# Patient Record
Sex: Male | Born: 1950 | Race: White | Hispanic: No | Marital: Married | Smoking: Never smoker
Health system: Southern US, Community
[De-identification: ages and names within clinical notes are randomized; demographics above are authoritative.]

## PROBLEM LIST (undated history)

## (undated) DIAGNOSIS — I2510 Atherosclerotic heart disease of native coronary artery without angina pectoris: Secondary | ICD-10-CM

## (undated) DIAGNOSIS — E669 Obesity, unspecified: Secondary | ICD-10-CM

## (undated) DIAGNOSIS — K219 Gastro-esophageal reflux disease without esophagitis: Secondary | ICD-10-CM

## (undated) DIAGNOSIS — E785 Hyperlipidemia, unspecified: Secondary | ICD-10-CM

## (undated) DIAGNOSIS — E039 Hypothyroidism, unspecified: Secondary | ICD-10-CM

## (undated) DIAGNOSIS — M1990 Unspecified osteoarthritis, unspecified site: Secondary | ICD-10-CM

## (undated) DIAGNOSIS — C189 Malignant neoplasm of colon, unspecified: Secondary | ICD-10-CM

## (undated) HISTORY — DX: Atherosclerotic heart disease of native coronary artery without angina pectoris: I25.10

## (undated) HISTORY — DX: Hyperlipidemia, unspecified: E78.5

## (undated) HISTORY — DX: Obesity, unspecified: E66.9

## (undated) HISTORY — PX: TONSILLECTOMY: SUR1361

## (undated) HISTORY — DX: Gastro-esophageal reflux disease without esophagitis: K21.9

## (undated) HISTORY — PX: FACIAL RECONSTRUCTION SURGERY: SHX631

## (undated) HISTORY — DX: Hypothyroidism, unspecified: E03.9

## (undated) HISTORY — PX: FINGER SURGERY: SHX640

---

## 1993-06-30 DIAGNOSIS — K219 Gastro-esophageal reflux disease without esophagitis: Secondary | ICD-10-CM

## 1993-06-30 HISTORY — PX: ESOPHAGOGASTRODUODENOSCOPY: SHX1529

## 1993-06-30 HISTORY — DX: Gastro-esophageal reflux disease without esophagitis: K21.9

## 1999-11-17 ENCOUNTER — Emergency Department (HOSPITAL_COMMUNITY): Admission: EM | Admit: 1999-11-17 | Discharge: 1999-11-17 | Payer: Self-pay | Admitting: Emergency Medicine

## 1999-11-19 ENCOUNTER — Emergency Department (HOSPITAL_COMMUNITY): Admission: EM | Admit: 1999-11-19 | Discharge: 1999-11-19 | Payer: Self-pay | Admitting: Emergency Medicine

## 1999-11-25 ENCOUNTER — Emergency Department (HOSPITAL_COMMUNITY): Admission: EM | Admit: 1999-11-25 | Discharge: 1999-11-25 | Payer: Self-pay | Admitting: *Deleted

## 2003-03-01 HISTORY — PX: COLONOSCOPY: SHX174

## 2003-03-10 ENCOUNTER — Ambulatory Visit (HOSPITAL_COMMUNITY): Admission: RE | Admit: 2003-03-10 | Discharge: 2003-03-10 | Payer: Self-pay | Admitting: Gastroenterology

## 2003-03-10 LAB — HM COLONOSCOPY

## 2013-01-22 ENCOUNTER — Encounter (HOSPITAL_COMMUNITY): Payer: Self-pay | Admitting: *Deleted

## 2013-01-22 ENCOUNTER — Emergency Department (HOSPITAL_COMMUNITY)
Admission: EM | Admit: 2013-01-22 | Discharge: 2013-01-22 | Disposition: A | Discharge status: Home / Self Care | Payer: BC Managed Care – PPO | Attending: Emergency Medicine | Admitting: Emergency Medicine

## 2013-01-22 DIAGNOSIS — S91009A Unspecified open wound, unspecified ankle, initial encounter: Secondary | ICD-10-CM | POA: Insufficient documentation

## 2013-01-22 DIAGNOSIS — W5911XA Bitten by nonvenomous snake, initial encounter: Secondary | ICD-10-CM | POA: Insufficient documentation

## 2013-01-22 DIAGNOSIS — W5901XA Bitten by nonvenomous lizards, initial encounter: Secondary | ICD-10-CM | POA: Insufficient documentation

## 2013-01-22 DIAGNOSIS — Z79899 Other long term (current) drug therapy: Secondary | ICD-10-CM | POA: Insufficient documentation

## 2013-01-22 DIAGNOSIS — E079 Disorder of thyroid, unspecified: Secondary | ICD-10-CM | POA: Insufficient documentation

## 2013-01-22 DIAGNOSIS — S81009A Unspecified open wound, unspecified knee, initial encounter: Secondary | ICD-10-CM | POA: Insufficient documentation

## 2013-01-22 DIAGNOSIS — Y9289 Other specified places as the place of occurrence of the external cause: Secondary | ICD-10-CM | POA: Insufficient documentation

## 2013-01-22 DIAGNOSIS — Y9389 Activity, other specified: Secondary | ICD-10-CM | POA: Insufficient documentation

## 2013-01-22 DIAGNOSIS — E7800 Pure hypercholesterolemia, unspecified: Secondary | ICD-10-CM | POA: Insufficient documentation

## 2013-01-22 DIAGNOSIS — Z7982 Long term (current) use of aspirin: Secondary | ICD-10-CM | POA: Insufficient documentation

## 2013-01-22 LAB — CBC WITH DIFFERENTIAL/PLATELET
Basophils Absolute: 0 10*3/uL (ref 0.0–0.1)
Eosinophils Relative: 2 % (ref 0–5)
Lymphocytes Relative: 27 % (ref 12–46)
MCV: 93.3 fL (ref 78.0–100.0)
Neutro Abs: 3.9 10*3/uL (ref 1.7–7.7)
Neutrophils Relative %: 62 % (ref 43–77)
Platelets: 246 10*3/uL (ref 150–400)
RDW: 14 % (ref 11.5–15.5)
WBC: 6.3 10*3/uL (ref 4.0–10.5)

## 2013-01-22 LAB — COMPREHENSIVE METABOLIC PANEL
ALT: 32 U/L (ref 0–53)
AST: 21 U/L (ref 0–37)
Alkaline Phosphatase: 56 U/L (ref 39–117)
CO2: 24 mEq/L (ref 19–32)
Calcium: 9.4 mg/dL (ref 8.4–10.5)
GFR calc non Af Amer: 57 mL/min — ABNORMAL LOW (ref >90)
Potassium: 4.3 mEq/L (ref 3.5–5.1)
Sodium: 139 mEq/L (ref 135–145)

## 2013-01-22 LAB — PROTIME-INR: INR: 0.98 (ref 0.00–1.49)

## 2013-01-22 MED ORDER — SODIUM CHLORIDE 0.9 % IV BOLUS (SEPSIS)
1000.0000 mL | Freq: Once | INTRAVENOUS | Status: AC
  Administered 2013-01-22: 1000 mL via INTRAVENOUS

## 2013-01-22 NOTE — ED Notes (Signed)
PT reports Rt leg is numb from toes to above the Rt knee. Pt has full range of motion of Rt leg. No swelling noted to RT leg.

## 2013-01-22 NOTE — ED Notes (Signed)
Pt reports possible snake bite to right lower leg, one small puncture wound noted, no swelling or redness noted. Pt was standing in water, did not see the snake.

## 2013-01-22 NOTE — ED Provider Notes (Signed)
  CSN: [id number]     Arrival date & time 01/22/13  1102 History     First MD Initiated Contact with Patient 01/22/13 1118     Chief Complaint  Patient presents with  . Snake Bite   (Consider location/radiation/quality/duration/timing/severity/associated sxs/prior Treatment) HPI.... patient was standing in a creek when he was bit by a snake in his right lower leg. There is a small puncture wound.  Slight redness surrounding the wound. Pain radiates up the leg a small distance. No chest pain, shortness of breath, fever, chills, rash.  Severity is mild. Nothing makes symptoms better or worse.  Past Medical History  Diagnosis Date  . Thyroid disease   . High cholesterol    History reviewed. No pertinent past surgical history. History reviewed. No pertinent family history. History  Substance Use Topics  . Smoking status: Not on file  . Smokeless tobacco: Not on file  . Alcohol Use: Yes    Review of Systems  All other systems reviewed and are negative.    Allergies  Review of patient's allergies indicates no known allergies.  Home Medications   Current Outpatient Rx  Name  Route  Sig  Dispense  Refill  . aspirin 81 MG chewable tablet   Oral   Chew 81 mg by mouth daily.         . Atorvastatin Calcium (LIPITOR PO)   Oral   Take 1 tablet by mouth daily.         Marland Kitchen esomeprazole (NEXIUM) 40 MG capsule   Oral   Take 40 mg by mouth daily before breakfast.         . Levothyroxine Sodium (SYNTHROID PO)   Oral   Take 1 tablet by mouth daily with breakfast.          BP 122/75  Pulse 75  Temp(Src) 97.3 F (36.3 C) (Oral)  Resp 18  SpO2 97% Physical Exam  Nursing note and vitals reviewed. Constitutional: He is oriented to person, place, and time. He appears well-developed and well-nourished.  HENT:  Head: Normocephalic and atraumatic.  Eyes: Conjunctivae and EOM are normal. Pupils are equal, round, and reactive to light.  Neck: Normal range of motion. Neck  supple.  Cardiovascular: Normal rate, regular rhythm and normal heart sounds.   Pulmonary/Chest: Effort normal and breath sounds normal.  Abdominal: Soft. Bowel sounds are normal.  Musculoskeletal: Normal range of motion.  Neurological: He is alert and oriented to person, place, and time.  Skin:  Right lower extremity: Very small puncture wound in the medial aspect of the right mid tibia.  Slight erythema.  Psychiatric: He has a normal mood and affect.    ED Course   Procedures (including critical care time)  Labs Reviewed  COMPREHENSIVE METABOLIC PANEL - Abnormal; Notable for the following:    GFR calc non Af Amer 57 (*)    GFR calc Af Amer 66 (*)    All other components within normal limits  CBC WITH DIFFERENTIAL  APTT  PROTIME-INR   No results found. 1. Snake bite, initial encounter     MDM  Patient observed for greater than 2 hours. No systemic side effects noted. Normal vital signs. Wound has not changed in character. No Crofab necessary  Donnetta Hutching, MD 01/22/13 1422

## 2013-03-25 LAB — COMPLETE METABOLIC PANEL WITH GFR
ALBUMIN: 3.9
ALK PHOS: 46 U/L
ALT: 24
AST: 19 U/L
BILIRUBIN: 0.5
CREATININE: 1.1
GLUCOSE: 100
POTASSIUM: 5.1 mmol/L
Sodium: 140 mmol/L (ref 137–147)

## 2013-03-25 LAB — LIPID PANEL
Cholesterol, Total: 204
HDL: 59 mg/dL (ref 35–70)
LDL (calc): 133
Triglycerides: 58

## 2013-03-25 LAB — TSH
FREE T4: 1.1
TSH: 1.58

## 2013-03-25 LAB — PSA: PSA: 1.857

## 2013-03-25 LAB — CBC
HEMOGLOBIN: 15.3 g/dL
PLATELET COUNT: 251
WBC: 5.8

## 2014-07-14 ENCOUNTER — Encounter (INDEPENDENT_AMBULATORY_CARE_PROVIDER_SITE_OTHER): Payer: Self-pay

## 2014-07-14 ENCOUNTER — Encounter: Payer: Self-pay | Admitting: Family Medicine

## 2014-07-14 ENCOUNTER — Ambulatory Visit (INDEPENDENT_AMBULATORY_CARE_PROVIDER_SITE_OTHER): Payer: BLUE CROSS/BLUE SHIELD | Admitting: Family Medicine

## 2014-07-14 VITALS — BP 126/76 | HR 60 | Temp 97.8°F | Ht 66.75 in | Wt 196.8 lb

## 2014-07-14 DIAGNOSIS — J3489 Other specified disorders of nose and nasal sinuses: Secondary | ICD-10-CM

## 2014-07-14 DIAGNOSIS — Z23 Encounter for immunization: Secondary | ICD-10-CM

## 2014-07-14 DIAGNOSIS — E785 Hyperlipidemia, unspecified: Secondary | ICD-10-CM

## 2014-07-14 DIAGNOSIS — E039 Hypothyroidism, unspecified: Secondary | ICD-10-CM | POA: Insufficient documentation

## 2014-07-14 DIAGNOSIS — K219 Gastro-esophageal reflux disease without esophagitis: Secondary | ICD-10-CM

## 2014-07-14 DIAGNOSIS — E669 Obesity, unspecified: Secondary | ICD-10-CM

## 2014-07-14 HISTORY — DX: Obesity, unspecified: E66.9

## 2014-07-14 LAB — BASIC METABOLIC PANEL
BUN: 17 mg/dL (ref 6–23)
CALCIUM: 9.6 mg/dL (ref 8.4–10.5)
CHLORIDE: 108 meq/L (ref 96–112)
CO2: 25 meq/L (ref 19–32)
Creatinine, Ser: 1.28 mg/dL (ref 0.40–1.50)
GFR: 60.39 mL/min (ref >60.00)
GLUCOSE: 104 mg/dL — AB (ref 70–99)
Potassium: 4.5 mEq/L (ref 3.5–5.1)
Sodium: 142 mEq/L (ref 135–145)

## 2014-07-14 LAB — LIPID PANEL
CHOLESTEROL: 193 mg/dL (ref 0–200)
HDL: 56.8 mg/dL (ref >39.00)
LDL CALC: 116 mg/dL — AB (ref 0–99)
NONHDL: 136.2
Total CHOL/HDL Ratio: 3
Triglycerides: 99 mg/dL (ref 0.0–149.0)
VLDL: 19.8 mg/dL (ref 0.0–40.0)

## 2014-07-14 LAB — TSH: TSH: 1.54 u[IU]/mL (ref 0.35–4.50)

## 2014-07-14 MED ORDER — ESOMEPRAZOLE MAGNESIUM 40 MG PO CPDR: 40.0000 mg | DELAYED_RELEASE_CAPSULE | Freq: Every day | ORAL | Status: DC

## 2014-07-14 MED ORDER — LEVOTHYROXINE SODIUM 100 MCG PO TABS: 100.0000 ug | ORAL_TABLET | Freq: Every day | ORAL | Status: DC

## 2014-07-14 MED ORDER — ATORVASTATIN CALCIUM 20 MG PO TABS: 20.0000 mg | ORAL_TABLET | Freq: Every day | ORAL | Status: DC

## 2014-07-14 NOTE — Assessment & Plan Note (Signed)
Stable on nexium - continue.

## 2014-07-14 NOTE — Assessment & Plan Note (Signed)
Chronic, stable. Recheck TSH.

## 2014-07-14 NOTE — Assessment & Plan Note (Signed)
Concern for [facility] - derm referral today.

## 2014-07-14 NOTE — Progress Notes (Signed)
BP 126/76 mmHg  Pulse 60  Temp(Src) 97.8 F (36.6 C) (Oral)  Ht 5' 6.75" (1.695 m)  Wt 196 lb 12 oz (89.245 kg)  BMI 31.06 kg/m2  SpO2 97%   CC: new pt to establish  Subjective:    Patient ID: Caleb Smith, male    DOB: 1950/10/24, 64 y.o.   MRN: [id number]  HPI: Caleb Smith is a 64 y.o. male presenting on 07/14/2014 for Establish Care   Prior saw Dr [facility].  Hyperlipidemia - tolerating atorvastatin 20mg  daily without myalgias.   GERD - nexium 40mg  daily without breakthrough symptoms. h/o esophageal dilation for stricture. Improved once quit smokeless tobacco.   Hypothyroidism - levothyroxine 179mcg daily. Longterm. Acquired. No hypothyroid sxs of weight changes, BM changes, hairi/nail changes.  Wakes up coughing in am, minimal white phlegm. Ongoing for last year. No weight changes. Mother with lung cancer. No cough rest of day.   Bump on nose on left present for 1-2 yrs. Recurrent bleeding then healing. Interested in further evaluation.  DOT CPE every 2 years.  Regular CPE every year.  Preventative: Colonoscopy - unsure, about 5 yrs ago. May be due for rpt. Flu shot today.  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 Interim medical history since our last visit reviewed. Allergies and medications reviewed and updated. Current Outpatient Prescriptions on File Prior to Visit  Medication Sig  . aspirin 81 MG chewable tablet Chew 81 mg by mouth daily.   No current facility-administered medications on file prior to visit.    Review of Systems Per HPI unless specifically indicated above     Objective:    BP 126/76 mmHg  Pulse 60  Temp(Src) 97.8 F (36.6 C) (Oral)  Ht 5' 6.75" (1.695 m)  Wt 196 lb 12 oz (89.245 kg)  BMI 31.06 kg/m2   SpO2 97%  Wt Readings from Last 3 Encounters:  07/14/14 196 lb 12 oz (89.245 kg)    Physical Exam  Constitutional: He is oriented to person, place, and time. He appears well-developed and well-nourished. No distress.  HENT:  Head: Normocephalic and atraumatic.  Right Ear: Hearing, tympanic membrane, external ear and ear canal normal.  Left Ear: Hearing, tympanic membrane, external ear and ear canal normal.  Nose: Nose normal.  Mouth/Throat: Oropharynx is clear and moist and mucous membranes are normal. No oropharyngeal exudate, posterior oropharyngeal edema or posterior oropharyngeal erythema.    Nodule with recurrent ulceration left nose  Eyes: Conjunctivae and EOM are normal. Pupils are equal, round, and reactive to light. No scleral icterus.  Neck: Normal range of motion. Neck supple. No thyromegaly present.  Cardiovascular: Normal rate, regular rhythm, normal heart sounds and intact distal pulses.   No murmur heard. Pulses:      Radial pulses are 2+ on the right side, and 2+ on the left side.  Pulmonary/Chest: Effort normal and breath sounds normal. No respiratory distress. He has no wheezes. He has no rales.  Musculoskeletal: Normal range of motion. He exhibits no edema.  Lymphadenopathy:    He has no cervical adenopathy.  Neurological: He is alert and oriented to person, place, and time.  CN grossly intact, station and gait intact  Skin: Skin is warm and dry. No rash noted.  Psychiatric: He has a normal mood and affect. His behavior is normal. Judgment and  thought content normal.  Nursing note and vitals reviewed.      Assessment & Plan:   Problem List Items Addressed This Visit    Obesity   Lesion of nose - Primary    Concern for [facility] - derm referral today.      Relevant Orders   Ambulatory referral to Dermatology   Hypothyroidism    Chronic, stable. Recheck TSH.      Relevant Medications   levothyroxine (SYNTHROID, LEVOTHROID) tablet   Other Relevant Orders    TSH   HLD (hyperlipidemia)    Chronic, tolerating atorvastatin. Check FLP today as fasting.      Relevant Medications   atorvastatin (LIPITOR) tablet   Other Relevant Orders   Lipid panel   Basic metabolic panel   GERD (gastroesophageal reflux disease)    Stable on nexium - continue.      Relevant Medications   esomeprazole (NEXIUM) capsule    Other Visit Diagnoses    Need for influenza vaccination        Relevant Orders    Flu Vaccine QUAD 36+ mos PF IM (Fluarix Quad PF) (Completed)        Follow up plan: Return in about 3 months (around 10/13/2014), or as needed, for annual exam, prior fasting for blood work.

## 2014-07-14 NOTE — Progress Notes (Signed)
Pre visit review using our clinic review tool, if applicable. No additional management support is needed unless otherwise documented below in the visit note. 

## 2014-07-14 NOTE — Patient Instructions (Addendum)
Flu shot today. Pass by [facility]'s office for referral to skin doctor. Return at your convenience in 3-4 months for fasting labs and afterwards for physical. Good to meet you today, call us with questions.

## 2014-07-14 NOTE — Assessment & Plan Note (Addendum)
Chronic, tolerating atorvastatin. Check FLP today as fasting.

## 2014-07-17 ENCOUNTER — Encounter: Payer: Self-pay | Admitting: *Deleted

## 2014-07-17 LAB — CBC
HGB: 15.3 g/dL
WBC: 5.8
platelet count: 251

## 2014-07-17 LAB — LIPID PANEL
CHOLESTEROL, TOTAL: 204
HDL: 59 mg/dL (ref 35–70)
LDL CALC: 133
TRIGLYCERIDES: 58

## 2014-07-17 LAB — COMPREHENSIVE METABOLIC PANEL
BUN: 16 mg/dL (ref 4–21)
Creat: 1.1
GFR CALC AF AMER: 82.3
GLUCOSE: 100
Potassium: 5.1 mmol/L
SODIUM: 140 mmol/L (ref 137–147)

## 2014-07-17 LAB — PSA: PSA: 1.857

## 2014-07-17 LAB — TSH: TSH: 1.58

## 2014-08-02 ENCOUNTER — Encounter: Payer: Self-pay | Admitting: *Deleted

## 2014-08-08 ENCOUNTER — Other Ambulatory Visit: Payer: Self-pay | Admitting: Dermatology

## 2014-09-10 ENCOUNTER — Encounter: Payer: Self-pay | Admitting: Family Medicine

## 2014-09-13 ENCOUNTER — Encounter: Payer: Self-pay | Admitting: *Deleted

## 2014-10-11 ENCOUNTER — Other Ambulatory Visit: Payer: Self-pay | Admitting: Family Medicine

## 2014-10-13 ENCOUNTER — Other Ambulatory Visit: Payer: BLUE CROSS/BLUE SHIELD

## 2014-10-20 ENCOUNTER — Encounter: Payer: BLUE CROSS/BLUE SHIELD | Admitting: Family Medicine

## 2014-10-27 ENCOUNTER — Encounter: Payer: Self-pay | Admitting: Family Medicine

## 2014-10-27 ENCOUNTER — Ambulatory Visit (INDEPENDENT_AMBULATORY_CARE_PROVIDER_SITE_OTHER): Payer: BLUE CROSS/BLUE SHIELD | Admitting: Family Medicine

## 2014-10-27 VITALS — BP 118/78 | HR 76 | Temp 97.9°F | Ht 66.75 in | Wt 198.0 lb

## 2014-10-27 DIAGNOSIS — E669 Obesity, unspecified: Secondary | ICD-10-CM

## 2014-10-27 DIAGNOSIS — Z0000 Encounter for general adult medical examination without abnormal findings: Secondary | ICD-10-CM | POA: Diagnosis not present

## 2014-10-27 DIAGNOSIS — E785 Hyperlipidemia, unspecified: Secondary | ICD-10-CM

## 2014-10-27 DIAGNOSIS — E039 Hypothyroidism, unspecified: Secondary | ICD-10-CM

## 2014-10-27 DIAGNOSIS — K219 Gastro-esophageal reflux disease without esophagitis: Secondary | ICD-10-CM

## 2014-10-27 MED ORDER — OMEPRAZOLE 40 MG PO CPDR: 40.0000 mg | DELAYED_RELEASE_CAPSULE | Freq: Every day | ORAL | Status: DC

## 2014-10-27 NOTE — Assessment & Plan Note (Signed)
nexium unaffordable, will now prescribe omeprazole 40mg  daily. Pt will update me with effect.

## 2014-10-27 NOTE — Assessment & Plan Note (Addendum)
Preventative protocols reviewed and updated unless pt declined. Discussed healthy diet and lifestyle.  

## 2014-10-27 NOTE — Progress Notes (Signed)
Pre visit review using our clinic review tool, if applicable. No additional management support is needed unless otherwise documented below in the visit note. 

## 2014-10-27 NOTE — Assessment & Plan Note (Signed)
Body mass index is 31.26 kg/(m^2).

## 2014-10-27 NOTE — Progress Notes (Signed)
BP 118/78 mmHg  Pulse 76  Temp(Src) 97.9 F (36.6 C) (Oral)  Ht 5' 6.75" (1.695 m)  Wt 198 lb (89.812 kg)  BMI 31.26 kg/m2   CC: CPE  Subjective:    Patient ID: Caleb Smith, male    DOB: 1950-08-22, 64 y.o.   MRN: [id number]  HPI: [name] is a 64 y.o. male presenting on 10/27/2014 for Annual Exam   GERD - nexium over expensive.  HLD - compliant with lipitor 13m daily. No myalgias. Hypothyroidism - compliant with levothyroxine 1067m daily.   Preventative: Colonoscopy - at [facility]. About 5 yrs ago.  Prostate cancer screening - discussed, would like to defer. Flu shot 2016 Td 2011 Zostavax 2013 Seat belt use discussed Sunscreen use discussed. No changing moles on skin.  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 Interim medical history since our last visit reviewed. Allergies and medications reviewed and updated. Current Outpatient Prescriptions on File Prior to Visit  Medication Sig  . aspirin 81 MG chewable tablet Chew 81 mg by mouth daily.  . Marland Kitchentorvastatin (LIPITOR) 20 MG tablet Take 1 tablet (20 mg total) by mouth daily at 6 PM.  . levothyroxine (SYNTHROID, LEVOTHROID) 100 MCG tablet Take 1 tablet (100 mcg total) by mouth daily before breakfast.   No current facility-administered medications on file prior to visit.    Review of Systems  Constitutional: Negative for fever, chills, activity change, appetite change, fatigue and unexpected weight change.  HENT: Negative for hearing loss.   Eyes: Negative for visual disturbance.  Respiratory: Positive for cough (am). Negative for chest tightness, shortness of breath and wheezing.   Cardiovascular: Negative for chest pain, palpitations and leg swelling.  Gastrointestinal: Negative for nausea, vomiting,  abdominal pain, diarrhea, constipation, blood in stool and abdominal distention.  Genitourinary: Negative for hematuria and difficulty urinating.  Musculoskeletal: Negative for myalgias, arthralgias and neck pain.  Skin: Negative for rash.  Neurological: Negative for dizziness, seizures, syncope and headaches.  Hematological: Negative for adenopathy. Does not bruise/bleed easily.  Psychiatric/Behavioral: Negative for dysphoric mood. The patient is not nervous/anxious.    Per HPI unless specifically indicated above     Objective:    BP 118/78 mmHg  Pulse 76  Temp(Src) 97.9 F (36.6 C) (Oral)  Ht 5' 6.75" (1.695 m)  Wt 198 lb (89.812 kg)  BMI 31.26 kg/m2  Wt Readings from Last 3 Encounters:  10/27/14 198 lb (89.812 kg)  07/14/14 196 lb 12 oz (89.245 kg)    Physical Exam  Constitutional: He is oriented to person, place, and time. He appears well-developed and well-nourished. No distress.  HENT:  Head: Normocephalic and atraumatic.  Right Ear: Hearing, tympanic membrane, external ear and ear canal normal.  Left Ear: Hearing, tympanic membrane, external ear and ear canal normal.  Nose: Nose normal.  Mouth/Throat: Uvula is midline, oropharynx is clear and moist and mucous membranes are normal. No oropharyngeal exudate, posterior oropharyngeal edema or posterior oropharyngeal erythema.  Eyes: Conjunctivae and EOM are normal. Pupils are equal, round, and reactive to light. No scleral icterus.  Neck: Normal range of motion. Neck supple. Carotid bruit is not present. No thyromegaly present.  Cardiovascular: Normal rate, regular rhythm, normal heart sounds and intact distal pulses.   No murmur heard. Pulses:      Radial pulses are 2+ on the right  side, and 2+ on the left side.  Pulmonary/Chest: Effort normal and breath sounds normal. No respiratory distress. He has no wheezes. He has no rales.  Abdominal: Soft. Bowel sounds are normal. He exhibits no distension and no mass. There is no  tenderness. There is no rebound and no guarding.  Musculoskeletal: Normal range of motion. He exhibits no edema.  Lymphadenopathy:    He has no cervical adenopathy.  Neurological: He is alert and oriented to person, place, and time.  CN grossly intact, station and gait intact  Skin: Skin is warm and dry. No rash noted.  Psychiatric: He has a normal mood and affect. His behavior is normal. Judgment and thought content normal.  Nursing note and vitals reviewed.  Results for orders placed or performed in visit on 09/13/14  Comprehensive metabolic panel  Result Value Ref Range   Glucose 100    BUN 16 4 - 21 mg/dL   Creat 1.1    EGFR (African American) 82.3    Sodium 140 137 - 147 mmol/L   Potassium 5.1 mmol/L  CBC  Result Value Ref Range   WBC 5.80    HGB 15.3 g/dL   platelet count 251   Lipid panel  Result Value Ref Range   Cholesterol, Total 204    Triglycerides 58    HDL 59 35 - 70 mg/dL   LDL (calc) 133   TSH  Result Value Ref Range   TSH 1.58   PSA  Result Value Ref Range   PSA 1.857       Assessment & Plan:   Problem List Items Addressed This Visit    Obesity    Body mass index is 31.26 kg/(m^2).       Hypothyroidism    Chronic, stable. Continue levothyroxine.      HLD (hyperlipidemia)    Chronic, stable. Continue lipitor.      Health maintenance examination - Primary    Preventative protocols reviewed and updated unless pt declined. Discussed healthy diet and lifestyle.       GERD (gastroesophageal reflux disease)    nexium unaffordable, will now prescribe omeprazole 13m daily. Pt will update me with effect.      Relevant Medications   omeprazole (PRILOSEC) 40 MG capsule       Follow up plan: Return in about 1 year (around 10/27/2015), or as needed, for follow up visit.

## 2014-10-27 NOTE — Patient Instructions (Addendum)
Check with wife on name of stomach doctor for colonoscopy. Sign release form for colonoscopy at front office. Return in 4 months for lab visit only Good to see you today, call us with questions. Return in 1 year for next physical.

## 2014-10-27 NOTE — Assessment & Plan Note (Signed)
Chronic, stable. Continue levothyroxine. 

## 2014-10-27 NOTE — Assessment & Plan Note (Signed)
Chronic, stable. Continue lipitor.  

## 2014-11-11 ENCOUNTER — Telehealth: Payer: Self-pay | Admitting: Family Medicine

## 2014-11-11 ENCOUNTER — Encounter: Payer: Self-pay | Admitting: Family Medicine

## 2014-11-11 DIAGNOSIS — Z1211 Encounter for screening for malignant neoplasm of colon: Secondary | ICD-10-CM

## 2014-11-11 NOTE — Telephone Encounter (Signed)
plz notify I received copy of colonoscopy - actually had done 2004 so is due for f/u. Would offer either rpt colonoscopy or come in for iFOB. Let us know what he decides.

## 2014-11-14 NOTE — Telephone Encounter (Signed)
Patient notified as instructed by telephone and verbalized understanding. Patient stated that he would rather come in for the iFOB.

## 2014-11-14 NOTE — Telephone Encounter (Signed)
Ordered. plz schedule lab visit.

## 2014-11-15 NOTE — Telephone Encounter (Signed)
Left voicemail for patient to call back. 

## 2014-11-15 NOTE — Telephone Encounter (Signed)
Patient does not need lab appt. He only needs to pick up stool kit at his convenience.

## 2014-11-17 ENCOUNTER — Telehealth: Payer: Self-pay | Admitting: Family Medicine

## 2014-11-17 NOTE — Telephone Encounter (Signed)
Lm on pts vm requesting a call back 

## 2014-11-17 NOTE — Telephone Encounter (Signed)
Cards mailed to patient with instructions.

## 2014-11-17 NOTE — Telephone Encounter (Signed)
Please call pt back, he is concerned about needing labs, thanks

## 2014-11-17 NOTE — Telephone Encounter (Signed)
Spoke with patient. He does not need lab work. He only needed stool kit, which I mailed to him today. He was confused about message that was left for him about needing appt to pick one up.

## 2014-12-04 ENCOUNTER — Encounter: Payer: Self-pay | Admitting: *Deleted

## 2014-12-04 ENCOUNTER — Other Ambulatory Visit: Payer: BLUE CROSS/BLUE SHIELD

## 2014-12-04 DIAGNOSIS — Z1211 Encounter for screening for malignant neoplasm of colon: Secondary | ICD-10-CM

## 2014-12-04 LAB — FECAL OCCULT BLOOD, IMMUNOCHEMICAL: Fecal Occult Bld: NEGATIVE

## 2014-12-04 LAB — FECAL OCCULT BLOOD, GUAIAC: FECAL OCCULT BLD: NEGATIVE

## 2014-12-06 NOTE — Telephone Encounter (Signed)
error 

## 2014-12-26 ENCOUNTER — Ambulatory Visit (INDEPENDENT_AMBULATORY_CARE_PROVIDER_SITE_OTHER): Payer: BLUE CROSS/BLUE SHIELD | Admitting: Internal Medicine

## 2014-12-26 ENCOUNTER — Encounter: Payer: Self-pay | Admitting: Internal Medicine

## 2014-12-26 VITALS — BP 130/82 | HR 62 | Temp 98.0°F | Wt 197.0 lb

## 2014-12-26 DIAGNOSIS — S30863A Insect bite (nonvenomous) of scrotum and testes, initial encounter: Secondary | ICD-10-CM | POA: Diagnosis not present

## 2014-12-26 DIAGNOSIS — W57XXXA Bitten or stung by nonvenomous insect and other nonvenomous arthropods, initial encounter: Secondary | ICD-10-CM

## 2014-12-26 NOTE — Progress Notes (Signed)
Subjective:    Patient ID: Caleb Smith, male    DOB: 17-Jul-1950, 64 y.o.   MRN: [id number]  HPI  Pt presents to the clinic today with c/o a tick bite. This occurred yesterday. It was located on his left testicle. He reports it is very itchy. It was very red yesterday but the redness has gone down. He does not think the tick was on there for > 24 hours. The tick was not engorged. He has noticed some clear drainage from the tick bite. He denies fever, chills, body aches, diarrhea, joint pain of confusion. He has not put anything OTC on it.  Review of Systems      Past Medical History  Diagnosis Date  . Hypothyroidism   . HLD (hyperlipidemia)   . GERD (gastroesophageal reflux disease) 1995    s/p esoph dilation for stricture  . Obesity 07/14/2014    Current Outpatient Prescriptions  Medication Sig Dispense Refill  . aspirin 81 MG chewable tablet Chew 81 mg by mouth daily.    Marland Kitchen atorvastatin (LIPITOR) 20 MG tablet Take 1 tablet (20 mg total) by mouth daily at 6 PM. 30 tablet 3  . levothyroxine (SYNTHROID, LEVOTHROID) 100 MCG tablet Take 1 tablet (100 mcg total) by mouth daily before breakfast. 30 tablet 3  . omeprazole (PRILOSEC) 40 MG capsule Take 1 capsule (40 mg total) by mouth daily. 30 capsule 11   No current facility-administered medications for this visit.    No Known Allergies  Family History  Problem Relation Age of Onset  . Cancer Mother     breast  . CAD Maternal Uncle 62    massive MI  . Stroke Neg Hx   . Diabetes Neg Hx   . Hypertension Neg Hx   . Suicidality Father     suicide    History   Social History  . Marital Status: Married    Spouse Name: N/A  . Number of Children: N/A  . Years of Education: N/A   Occupational History  . Not on file.   Social History Main Topics  . Smoking status: Never Smoker   . Smokeless tobacco: Former Systems developer    Types: Chew  . Alcohol Use: 0.0 oz/week    0 Standard drinks or equivalent per week     Comment:  2-3 beer daily  . Drug Use: No  . Sexual Activity: Not on file   Other Topics Concern  . Not on file   Social History Narrative   Lives with wife   Grown children   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[employer]   Edu: [employer]   Activity: active on farm   Diet: good water, fruits/vegetables daily     Constitutional: Denies fever, malaise, fatigue, headache or abrupt weight changes.  Respiratory: Denies difficulty breathing, shortness of breath, cough or sputum production.   Cardiovascular: Denies chest pain, chest tightness, palpitations or swelling in the hands or feet.  Skin: Pt reports tick bite. Denies  ulcercations.    No other specific complaints in a complete review of systems (except as listed in HPI above).  Objective:   Physical Exam  BP 130/82 mmHg  Pulse 62  Temp(Src) 98 F (36.7 C) (Oral)  Wt 197 lb (89.359 kg)  SpO2 99% Wt Readings from Last 3 Encounters:  12/26/14 197 lb (89.359 kg)  10/27/14 198 lb (89.812 kg)  07/14/14 196 lb 12 oz (89.245 kg)    General: Appears his stated age, in NAD. Skin: Warm,  dry and intact. Tick bite noted to proximal left testicle. Local reaction only. No s/s of infection. Cardiovascular: Normal rate and rhythm. S1,S2 noted.   Pulmonary/Chest: Normal effort and positive vesicular breath sounds. No respiratory distress. No wheezes, rales or ronchi noted.  Abdomen: Soft and nontender. Normal bowel sounds, no bruits noted. No distention or masses noted. Liver, spleen and kidneys non palpable.   BMET    Component Value Date/Time   NA 140 07/17/2014   NA 142 07/14/2014 1102   K 5.1 07/17/2014   K 4.5 07/14/2014 1102   CL 108 07/14/2014 1102   CO2 25 07/14/2014 1102   GLUCOSE 104* 07/14/2014 1102   BUN 16 07/17/2014   BUN 17 07/14/2014 1102   CREATININE 1.1 07/17/2014   CREATININE 1.28 07/14/2014 1102   CALCIUM 9.6 07/14/2014 1102   GFRNONAA 57* 01/22/2013 1226   GFRAA 82.3 07/17/2014   GFRAA 66* 01/22/2013 1226     Lipid Panel     Component Value Date/Time   CHOL 204 07/17/2014   CHOL 193 07/14/2014 1102   TRIG 58 07/17/2014   TRIG 99.0 07/14/2014 1102   HDL 59 07/17/2014   CHOLHDL 3 07/14/2014 1102   VLDL 19.8 07/14/2014 1102   LDLCALC 133 07/17/2014   LDLCALC 116* 07/14/2014 1102    CBC    Component Value Date/Time   WBC 5.80 07/17/2014   RBC 4.61 01/22/2013 1226   HGB 15.3 07/17/2014   HGB 15.0 01/22/2013 1226   HCT 43.0 01/22/2013 1226   PLT 246 01/22/2013 1226   MCV 93.3 01/22/2013 1226   MCH 32.5 01/22/2013 1226   MCHC 34.9 01/22/2013 1226   RDW 14.0 01/22/2013 1226   LYMPHSABS 1.7 01/22/2013 1226   MONOABS 0.6 01/22/2013 1226   EOSABS 0.1 01/22/2013 1226   BASOSABS 0.0 01/22/2013 1226    Hgb A1C No results found for: HGBA1C       Assessment & Plan:   Tick bite of scrotum with local reaction:  Use Hydrocortisone cream to the affected area BID Watch for redness, fever, rash, joint pains, etc  RTC as needed or if symptoms persist or worsen

## 2014-12-26 NOTE — Patient Instructions (Signed)
Tick Bite Information Ticks are insects that attach themselves to the skin and draw blood for food. There are various types of ticks. Common types include wood ticks and deer ticks. Most ticks live in shrubs and grassy areas. Ticks can climb onto your body when you make contact with leaves or grass where the tick is waiting. The most common places on the body for ticks to attach themselves are the scalp, neck, armpits, waist, and groin. Most tick bites are harmless, but sometimes ticks carry germs that cause diseases. These germs can be spread to a person during the tick's feeding process. The chance of a disease spreading through a tick bite depends on:   The type of tick.  Time of year.   How long the tick is attached.   Geographic location.  HOW CAN YOU PREVENT TICK BITES? Take these steps to help prevent tick bites when you are outdoors:  Wear protective clothing. Long sleeves and long pants are best.   Wear white clothes so you can see ticks more easily.  Tuck your pant legs into your socks.   If walking on a trail, stay in the middle of the trail to avoid brushing against bushes.  Avoid walking through areas with long grass.  Put insect repellent on all exposed skin and along boot tops, pant legs, and sleeve cuffs.   Check clothing, hair, and skin repeatedly and before going inside.   Brush off any ticks that are not attached.  Take a shower or bath as soon as possible after being outdoors.  WHAT IS THE PROPER WAY TO REMOVE A TICK? Ticks should be removed as soon as possible to help prevent diseases caused by tick bites. 1. If latex gloves are available, put them on before trying to remove a tick.  2. Using fine-point tweezers, grasp the tick as close to the skin as possible. You may also use curved forceps or a tick removal tool. Grasp the tick as close to its head as possible. Avoid grasping the tick on its body. 3. Pull gently with steady upward pressure until  the tick lets go. Do not twist the tick or jerk it suddenly. This may break off the tick's head or mouth parts. 4. Do not squeeze or crush the tick's body. This could force disease-carrying fluids from the tick into your body.  5. After the tick is removed, wash the bite area and your hands with soap and water or other disinfectant such as alcohol. 6. Apply a small amount of antiseptic cream or ointment to the bite site.  7. Wash and disinfect any instruments that were used.  Do not try to remove a tick by applying a hot match, petroleum jelly, or fingernail polish to the tick. These methods do not work and may increase the chances of disease being spread from the tick bite.  WHEN SHOULD YOU SEEK MEDICAL CARE? Contact your health care provider if you are unable to remove a tick from your skin or if a part of the tick breaks off and is stuck in the skin.  After a tick bite, you need to be aware of signs and symptoms that could be related to diseases spread by ticks. Contact your health care provider if you develop any of the following in the days or weeks after the tick bite:  Unexplained fever.  Rash. A circular rash that appears days or weeks after the tick bite may indicate the possibility of Lyme disease. The rash may resemble   a target with a bull's-eye and may occur at a different part of your body than the tick bite.  Redness and swelling in the area of the tick bite.   Tender, swollen lymph glands.   Diarrhea.   Weight loss.   Cough.   Fatigue.   Muscle, joint, or bone pain.   Abdominal pain.   Headache.   Lethargy or a change in your level of consciousness.  Difficulty walking or moving your legs.   Numbness in the legs.   Paralysis.  Shortness of breath.   Confusion.   Repeated vomiting.  Document Released: 06/13/2000 Document Revised: 04/06/2013 Document Reviewed: 11/24/2012 Exit[employer] Patient Information 2015 [employer]. This information is  not intended to replace advice given to you by your health care provider. Make sure you discuss any questions you have with your health care provider.  

## 2015-02-24 ENCOUNTER — Other Ambulatory Visit: Payer: Self-pay | Admitting: Family Medicine

## 2015-02-24 DIAGNOSIS — E669 Obesity, unspecified: Secondary | ICD-10-CM

## 2015-02-24 DIAGNOSIS — E785 Hyperlipidemia, unspecified: Secondary | ICD-10-CM

## 2015-02-24 DIAGNOSIS — E039 Hypothyroidism, unspecified: Secondary | ICD-10-CM

## 2015-02-28 ENCOUNTER — Other Ambulatory Visit (INDEPENDENT_AMBULATORY_CARE_PROVIDER_SITE_OTHER): Payer: BLUE CROSS/BLUE SHIELD

## 2015-02-28 DIAGNOSIS — E785 Hyperlipidemia, unspecified: Secondary | ICD-10-CM

## 2015-02-28 DIAGNOSIS — E039 Hypothyroidism, unspecified: Secondary | ICD-10-CM

## 2015-02-28 DIAGNOSIS — E669 Obesity, unspecified: Secondary | ICD-10-CM

## 2015-02-28 LAB — COMPREHENSIVE METABOLIC PANEL
ALT: 25 U/L (ref 0–53)
AST: 19 U/L (ref 0–37)
Albumin: 4.1 g/dL (ref 3.5–5.2)
Alkaline Phosphatase: 50 U/L (ref 39–117)
BUN: 14 mg/dL (ref 6–23)
CO2: 25 meq/L (ref 19–32)
Calcium: 8.9 mg/dL (ref 8.4–10.5)
Chloride: 108 mEq/L (ref 96–112)
Creatinine, Ser: 1.05 mg/dL (ref 0.40–1.50)
GFR: 75.75 mL/min (ref >60.00)
GLUCOSE: 96 mg/dL (ref 70–99)
POTASSIUM: 4.1 meq/L (ref 3.5–5.1)
Sodium: 140 mEq/L (ref 135–145)
Total Bilirubin: 0.5 mg/dL (ref 0.2–1.2)
Total Protein: 6.7 g/dL (ref 6.0–8.3)

## 2015-02-28 LAB — LIPID PANEL
CHOL/HDL RATIO: 3
Cholesterol: 169 mg/dL (ref 0–200)
HDL: 58.2 mg/dL (ref >39.00)
LDL CALC: 97 mg/dL (ref 0–99)
NONHDL: 110.94
Triglycerides: 71 mg/dL (ref 0.0–149.0)
VLDL: 14.2 mg/dL (ref 0.0–40.0)

## 2015-02-28 LAB — TSH: TSH: 2.23 u[IU]/mL (ref 0.35–4.50)

## 2015-03-02 ENCOUNTER — Encounter: Payer: Self-pay | Admitting: *Deleted

## 2015-05-02 ENCOUNTER — Other Ambulatory Visit: Payer: Self-pay | Admitting: Family Medicine

## 2015-10-21 ENCOUNTER — Other Ambulatory Visit: Payer: Self-pay | Admitting: Family Medicine

## 2015-10-21 DIAGNOSIS — E039 Hypothyroidism, unspecified: Secondary | ICD-10-CM

## 2015-10-21 DIAGNOSIS — E785 Hyperlipidemia, unspecified: Secondary | ICD-10-CM

## 2015-10-21 DIAGNOSIS — Z125 Encounter for screening for malignant neoplasm of prostate: Secondary | ICD-10-CM

## 2015-10-21 DIAGNOSIS — Z1159 Encounter for screening for other viral diseases: Secondary | ICD-10-CM

## 2015-10-23 ENCOUNTER — Other Ambulatory Visit: Payer: Self-pay | Admitting: Family Medicine

## 2015-10-23 ENCOUNTER — Other Ambulatory Visit (INDEPENDENT_AMBULATORY_CARE_PROVIDER_SITE_OTHER): Payer: BLUE CROSS/BLUE SHIELD

## 2015-10-23 DIAGNOSIS — Z125 Encounter for screening for malignant neoplasm of prostate: Secondary | ICD-10-CM | POA: Diagnosis not present

## 2015-10-23 DIAGNOSIS — Z1159 Encounter for screening for other viral diseases: Secondary | ICD-10-CM

## 2015-10-23 DIAGNOSIS — E785 Hyperlipidemia, unspecified: Secondary | ICD-10-CM

## 2015-10-23 DIAGNOSIS — E039 Hypothyroidism, unspecified: Secondary | ICD-10-CM

## 2015-10-23 LAB — TSH: TSH: 4.35 u[IU]/mL (ref 0.35–4.50)

## 2015-10-23 LAB — BASIC METABOLIC PANEL
BUN: 14 mg/dL (ref 6–23)
CO2: 27 mEq/L (ref 19–32)
Calcium: 9.3 mg/dL (ref 8.4–10.5)
Chloride: 105 mEq/L (ref 96–112)
Creatinine, Ser: 1.32 mg/dL (ref 0.40–1.50)
GFR: 58.05 mL/min — AB (ref >60.00)
Glucose, Bld: 107 mg/dL — ABNORMAL HIGH (ref 70–99)
POTASSIUM: 4.3 meq/L (ref 3.5–5.1)
SODIUM: 140 meq/L (ref 135–145)

## 2015-10-23 LAB — LIPID PANEL
CHOLESTEROL: 200 mg/dL (ref 0–200)
HDL: 59.1 mg/dL (ref >39.00)
LDL CALC: 118 mg/dL — AB (ref 0–99)
NonHDL: 140.6
TRIGLYCERIDES: 114 mg/dL (ref 0.0–149.0)
Total CHOL/HDL Ratio: 3
VLDL: 22.8 mg/dL (ref 0.0–40.0)

## 2015-10-23 LAB — PSA: PSA: 1.97 ng/mL (ref 0.10–4.00)

## 2015-10-24 LAB — HEPATITIS C ANTIBODY: HCV AB: NEGATIVE

## 2015-10-30 ENCOUNTER — Ambulatory Visit (INDEPENDENT_AMBULATORY_CARE_PROVIDER_SITE_OTHER): Payer: BLUE CROSS/BLUE SHIELD | Admitting: Family Medicine

## 2015-10-30 ENCOUNTER — Encounter: Payer: Self-pay | Admitting: Family Medicine

## 2015-10-30 VITALS — BP 134/82 | HR 64 | Temp 98.0°F | Ht 66.75 in | Wt 198.2 lb

## 2015-10-30 DIAGNOSIS — Z1211 Encounter for screening for malignant neoplasm of colon: Secondary | ICD-10-CM

## 2015-10-30 DIAGNOSIS — E669 Obesity, unspecified: Secondary | ICD-10-CM

## 2015-10-30 DIAGNOSIS — Z0000 Encounter for general adult medical examination without abnormal findings: Secondary | ICD-10-CM | POA: Diagnosis not present

## 2015-10-30 DIAGNOSIS — M722 Plantar fascial fibromatosis: Secondary | ICD-10-CM | POA: Insufficient documentation

## 2015-10-30 DIAGNOSIS — K219 Gastro-esophageal reflux disease without esophagitis: Secondary | ICD-10-CM

## 2015-10-30 DIAGNOSIS — E785 Hyperlipidemia, unspecified: Secondary | ICD-10-CM

## 2015-10-30 DIAGNOSIS — E039 Hypothyroidism, unspecified: Secondary | ICD-10-CM

## 2015-10-30 MED ORDER — SYNTHROID 100 MCG PO TABS: ORAL_TABLET | ORAL | Status: DC

## 2015-10-30 MED ORDER — OMEPRAZOLE 40 MG PO CPDR: 40.0000 mg | DELAYED_RELEASE_CAPSULE | Freq: Every day | ORAL | Status: DC

## 2015-10-30 MED ORDER — ATORVASTATIN CALCIUM 20 MG PO TABS: ORAL_TABLET | ORAL | Status: DC

## 2015-10-30 NOTE — Assessment & Plan Note (Signed)
Preventative protocols reviewed and updated unless pt declined. Discussed healthy diet and lifestyle.  

## 2015-10-30 NOTE — Assessment & Plan Note (Addendum)
Chronic, stable. Continue lipitor daily. Discussed diet changes to improve readings

## 2015-10-30 NOTE — Assessment & Plan Note (Signed)
Discussed healthy diet and lifestyle changes to affect sustainable weight loss  

## 2015-10-30 NOTE — Assessment & Plan Note (Signed)
Chronic, stable. Continue current regimen. 

## 2015-10-30 NOTE — Addendum Note (Signed)
Addended by: Ria Bush on: 10/30/2015 09:08 AM   Modules accepted: Miquel Dunn

## 2015-10-30 NOTE — Assessment & Plan Note (Signed)
Discussed treatment options, stretching exercises provided

## 2015-10-30 NOTE — Patient Instructions (Addendum)
Try spacing out omeprazole to every other day.  Pass by lab to pick up stool kit. For plantar fasciitis - do frozen water bottle massage, do exercises provided today, try heel lift gel insert into shoe. Should improve over time.  Good to see you today, call us with quesitons.  Health Maintenance, Male A healthy lifestyle and preventative care can promote health and wellness.  Maintain regular health, dental, and eye exams.  Eat a healthy diet. Foods like vegetables, fruits, whole grains, low-fat dairy products, and lean protein foods contain the nutrients you need and are low in calories. Decrease your intake of foods high in solid fats, added sugars, and salt. Get information about a proper diet from your health care provider, if necessary.  Regular physical exercise is one of the most important things you can do for your health. Most adults should get at least 150 minutes of moderate-intensity exercise (any activity that increases your heart rate and causes you to sweat) each week. In addition, most adults need muscle-strengthening exercises on 2 or more days a week.   Maintain a healthy weight. The body mass index (BMI) is a screening tool to identify possible weight problems. It provides an estimate of body fat based on height and weight. Your health care provider can find your BMI and can help you achieve or maintain a healthy weight. For males 20 years and older:  A BMI below 18.5 is considered underweight.  A BMI of 18.5 to 24.9 is normal.  A BMI of 25 to 29.9 is considered overweight.  A BMI of 30 and above is considered obese.  Maintain normal blood lipids and cholesterol by exercising and minimizing your intake of saturated fat. Eat a balanced diet with plenty of fruits and vegetables. Blood tests for lipids and cholesterol should begin at age 89 and be repeated every 5 years. If your lipid or cholesterol levels are high, you are over age 34, or you are at high risk for heart  disease, you may need your cholesterol levels checked more frequently.Ongoing high lipid and cholesterol levels should be treated with medicines if diet and exercise are not working.  If you smoke, find out from your health care provider how to quit. If you do not use tobacco, do not start.  Lung cancer screening is recommended for adults aged 65-80 years who are at high risk for developing lung cancer because of a history of smoking. A yearly low-dose CT scan of the lungs is recommended for people who have at least a 30-pack-year history of smoking and are current smokers or have quit within the past 15 years. A pack year of smoking is smoking an average of 1 pack of cigarettes a day for 1 year (for example, a 30-pack-year history of smoking could mean smoking 1 pack a day for 30 years or 2 packs a day for 15 years). Yearly screening should continue until the smoker has stopped smoking for at least 15 years. Yearly screening should be stopped for people who develop a health problem that would prevent them from having lung cancer treatment.  If you choose to drink alcohol, do not have more than 2 drinks per day. One drink is considered to be 12 oz (360 mL) of beer, 5 oz (150 mL) of wine, or 1.5 oz (45 mL) of liquor.  Avoid the use of street drugs. Do not share needles with anyone. Ask for help if you need support or instructions about stopping the use of drugs.  High blood pressure causes heart disease and increases the risk of stroke. High blood pressure is more likely to develop in:  People who have blood pressure in the end of the normal range (100-139/85-89 mm Hg).  People who are overweight or obese.  People who are African American.  If you are 47-96 years of age, have your blood pressure checked every 3-5 years. If you are 78 years of age or older, have your blood pressure checked every year. You should have your blood pressure measured twice--once when you are at a hospital or clinic, and  once when you are not at a hospital or clinic. Record the average of the two measurements. To check your blood pressure when you are not at a hospital or clinic, you can use:  An automated blood pressure machine at a pharmacy.  A home blood pressure monitor.  If you are 29-29 years old, ask your health care provider if you should take aspirin to prevent heart disease.  Diabetes screening involves taking a blood sample to check your fasting blood sugar level. This should be done once every 3 years after age 28 if you are at a normal weight and without risk factors for diabetes. Testing should be considered at a younger age or be carried out more frequently if you are overweight and have at least 1 risk factor for diabetes.  Colorectal cancer can be detected and often prevented. Most routine colorectal cancer screening begins at the age of 90 and continues through age 80. However, your health care provider may recommend screening at an earlier age if you have risk factors for colon cancer. On a yearly basis, your health care provider may provide home test kits to check for hidden blood in the stool. A small camera at the end of a tube may be used to directly examine the colon (sigmoidoscopy or colonoscopy) to detect the earliest forms of colorectal cancer. Talk to your health care provider about this at age 63 when routine screening begins. A direct exam of the colon should be repeated every 5-10 years through age 45, unless early forms of precancerous polyps or small growths are found.  People who are at an increased risk for hepatitis B should be screened for this virus. You are considered at high risk for hepatitis B if:  You were born in a country where hepatitis B occurs often. Talk with your health care provider about which countries are considered high risk.  Your parents were born in a high-risk country and you have not received a shot to protect against hepatitis B (hepatitis B  vaccine).  You have HIV or AIDS.  You use needles to inject street drugs.  You live with, or have sex with, someone who has hepatitis B.  You are a man who has sex with other men (MSM).  You get hemodialysis treatment.  You take certain medicines for conditions like cancer, organ transplantation, and autoimmune conditions.  Hepatitis C blood testing is recommended for all people born from [birth year] through 1964 and any individual with known risk factors for hepatitis C.  Healthy men should no longer receive prostate-specific antigen (PSA) blood tests as part of routine cancer screening. Talk to your health care provider about prostate cancer screening.  Testicular cancer screening is not recommended for adolescents or adult males who have no symptoms. Screening includes self-exam, a health care provider exam, and other screening tests. Consult with your health care provider about any symptoms you have or any  concerns you have about testicular cancer.  Practice safe sex. Use condoms and avoid high-risk sexual practices to reduce the spread of sexually transmitted infections (STIs).  You should be screened for STIs, including gonorrhea and chlamydia if:  You are sexually active and are younger than 24 years.  You are older than 24 years, and your health care provider tells you that you are at risk for this type of infection.  Your sexual activity has changed since you were last screened, and you are at an increased risk for chlamydia or gonorrhea. Ask your health care provider if you are at risk.  If you are at risk of being infected with HIV, it is recommended that you take a prescription medicine daily to prevent HIV infection. This is called pre-exposure prophylaxis (PrEP). You are considered at risk if:  You are a man who has sex with other men (MSM).  You are a heterosexual man who is sexually active with multiple partners.  You take drugs by injection.  You are sexually active  with a partner who has HIV.  Talk with your health care provider about whether you are at high risk of being infected with HIV. If you choose to begin PrEP, you should first be tested for HIV. You should then be tested every 3 months for as long as you are taking PrEP.  Use sunscreen. Apply sunscreen liberally and repeatedly throughout the day. You should seek shade when your shadow is shorter than you. Protect yourself by wearing long sleeves, pants, a wide-brimmed hat, and sunglasses year round whenever you are outdoors.  Tell your health care provider of new moles or changes in moles, especially if there is a change in shape or color. Also, tell your health care provider if a mole is larger than the size of a pencil eraser.  A one-time screening for abdominal aortic aneurysm (AAA) and surgical repair of large AAAs by ultrasound is recommended for men aged 43-75 years who are current or former smokers.  Stay current with your vaccines (immunizations).   This information is not intended to replace advice given to you by your health care provider. Make sure you discuss any questions you have with your health care provider.   Document Released: 12/13/2007 Document Revised: 07/07/2014 Document Reviewed: 11/11/2010 Elsevier Interactive Patient Education [employer].

## 2015-10-30 NOTE — Assessment & Plan Note (Signed)
Discussed QOD PPI trial.

## 2015-10-30 NOTE — Progress Notes (Signed)
Pre visit review using our clinic review tool, if applicable. No additional management support is needed unless otherwise documented below in the visit note. 

## 2015-10-30 NOTE — Progress Notes (Signed)
BP 134/82 mmHg  Pulse 64  Temp(Src) 98 F (36.7 C) (Oral)  Ht 5' 6.75" (1.695 m)  Wt 198 lb 4 oz (89.926 kg)  BMI 31.30 kg/m2   CC: CPE  Subjective:    Patient ID: Caleb Smith, male    DOB: [birth year], 65 y.o.   MRN: [id number]  HPI: Caleb Smith is a 65 y.o. male presenting on 10/30/2015 for Annual Exam   GERD - nexium unaffordable, now on omeprazole 40mg  daily and doing well.  Preventative: COLONOSCOPY Date: 03/2003 small sigmoid polyp, rpt 5 yrs [name]) Prostate cancer screening - discussed, would like to defer DRE.  Flu shot yearly Td 2011 Pneumovax 2013 Zostavax 2013 Seat belt use discussed Sunscreen use discussed. No changing moles on skin.  Lives with wife Grown children Occupation: Gen foreman/lineman  Edu: HS Activity: active on farm  Diet: good water, fruits/vegetables daily   Relevant past medical, surgical, family and social history reviewed and updated as indicated. Interim medical history since our last visit reviewed. Allergies and medications reviewed and updated. Current Outpatient Prescriptions on File Prior to Visit  Medication Sig  . aspirin 81 MG chewable tablet Chew 81 mg by mouth daily.   No current facility-administered medications on file prior to visit.    Review of Systems  Constitutional: Negative for fever, chills, activity change, appetite change, fatigue and unexpected weight change.  HENT: Positive for sinus pressure. Negative for hearing loss.   Eyes: Negative for visual disturbance.  Respiratory: Positive for cough (am cough). Negative for chest tightness, shortness of breath and wheezing.   Cardiovascular: Negative for chest pain, palpitations and leg swelling.  Gastrointestinal: Negative for nausea, vomiting, abdominal pain, diarrhea, constipation, blood in stool and abdominal distention.  Genitourinary: Negative for hematuria and difficulty urinating.  Musculoskeletal: Negative for myalgias, arthralgias and  neck pain.  Skin: Negative for rash.  Neurological: Positive for headaches (occasional ?sinus). Negative for dizziness, seizures and syncope.  Hematological: Negative for adenopathy. Bruises/bleeds easily.  Psychiatric/Behavioral: Negative for dysphoric mood. The patient is not nervous/anxious.    Per HPI unless specifically indicated in ROS section     Objective:    BP 134/82 mmHg  Pulse 64  Temp(Src) 98 F (36.7 C) (Oral)  Ht 5' 6.75" (1.695 m)  Wt 198 lb 4 oz (89.926 kg)  BMI 31.30 kg/m2  Wt Readings from Last 3 Encounters:  10/30/15 198 lb 4 oz (89.926 kg)  12/26/14 197 lb (89.359 kg)  10/27/14 198 lb (89.812 kg)    Physical Exam  Constitutional: He is oriented to person, place, and time. He appears well-developed and well-nourished. No distress.  HENT:  Head: Normocephalic and atraumatic.  Right Ear: Hearing, tympanic membrane, external ear and ear canal normal.  Left Ear: Hearing, tympanic membrane, external ear and ear canal normal.  Nose: Nose normal.  Mouth/Throat: Uvula is midline, oropharynx is clear and moist and mucous membranes are normal. No oropharyngeal exudate, posterior oropharyngeal edema or posterior oropharyngeal erythema.  Eyes: Conjunctivae and EOM are normal. Pupils are equal, round, and reactive to light. No scleral icterus.  Neck: Normal range of motion. Neck supple. Carotid bruit is present. No thyromegaly present.  Cardiovascular: Normal rate, regular rhythm, normal heart sounds and intact distal pulses.   No murmur heard. Pulses:      Radial pulses are 2+ on the right side, and 2+ on the left side.  Pulmonary/Chest: Effort normal and breath sounds normal. No respiratory distress. He has no wheezes. He has  no rales.  Abdominal: Soft. Bowel sounds are normal. He exhibits no distension and no mass. There is no tenderness. There is no rebound and no guarding.  Genitourinary:  DRE - deferred  Musculoskeletal: Normal range of motion. He exhibits no  edema.  2+ DP on right Mildly tender to palpation at heel  Lymphadenopathy:    He has no cervical adenopathy.  Neurological: He is alert and oriented to person, place, and time.  CN grossly intact, station and gait intact  Skin: Skin is warm and dry. No rash noted.  Psychiatric: He has a normal mood and affect. His behavior is normal. Judgment and thought content normal.  Nursing note and vitals reviewed.  Results for orders placed or performed in visit on 10/23/15  Lipid panel  Result Value Ref Range   Cholesterol 200 0 - 200 mg/dL   Triglycerides 114.0 0.0 - 149.0 mg/dL   HDL 59.10 >39.00 mg/dL   VLDL 22.8 0.0 - 40.0 mg/dL   LDL Cholesterol 118 (H) 0 - 99 mg/dL   Total CHOL/HDL Ratio 3    NonHDL 140.60   TSH  Result Value Ref Range   TSH 4.35 0.35 - 4.50 uIU/mL  PSA  Result Value Ref Range   PSA 1.97 0.10 - 4.00 ng/mL  Basic metabolic panel  Result Value Ref Range   Sodium 140 135 - 145 mEq/L   Potassium 4.3 3.5 - 5.1 mEq/L   Chloride 105 96 - 112 mEq/L   CO2 27 19 - 32 mEq/L   Glucose, Bld 107 (H) 70 - 99 mg/dL   BUN 14 6 - 23 mg/dL   Creatinine, Ser 1.32 0.40 - 1.50 mg/dL   Calcium 9.3 8.4 - 10.5 mg/dL   GFR 58.05 (L) >60.00 mL/min      Assessment & Plan:   Problem List Items Addressed This Visit    Hypothyroidism    Chronic, stable. Continue current regimen.      Relevant Medications   SYNTHROID 100 MCG tablet   HLD (hyperlipidemia)    Chronic, stable. Continue lipitor daily. Discussed diet changes to improve readings      Relevant Medications   atorvastatin (LIPITOR) 20 MG tablet   GERD (gastroesophageal reflux disease)    Discussed QOD PPI trial.       Relevant Medications   omeprazole (PRILOSEC) 40 MG capsule   Obesity, Class I, BMI 30-34.9    Discussed healthy diet and lifestyle changes to affect sustainable weight loss.      Health maintenance examination - Primary    Preventative protocols reviewed and updated unless pt declined. Discussed  healthy diet and lifestyle.       Plantar fasciitis, right    Discussed treatment options, stretching exercises provided       Other Visit Diagnoses    Special screening for malignant neoplasms, colon        Relevant Orders    Fecal occult blood, imunochemical        Follow up plan: Return in about 1 year (around 10/29/2016), or as needed, for annual exam, prior fasting for blood work.  Ria Bush, MD

## 2015-12-03 ENCOUNTER — Other Ambulatory Visit: Payer: BLUE CROSS/BLUE SHIELD

## 2015-12-03 DIAGNOSIS — Z1211 Encounter for screening for malignant neoplasm of colon: Secondary | ICD-10-CM

## 2015-12-03 LAB — FECAL OCCULT BLOOD, IMMUNOCHEMICAL: Fecal Occult Bld: NEGATIVE

## 2016-05-28 DIAGNOSIS — B300 Keratoconjunctivitis due to adenovirus: Secondary | ICD-10-CM | POA: Diagnosis not present

## 2016-06-26 ENCOUNTER — Encounter: Payer: Self-pay | Admitting: Internal Medicine

## 2016-06-26 ENCOUNTER — Ambulatory Visit (INDEPENDENT_AMBULATORY_CARE_PROVIDER_SITE_OTHER)
Admission: RE | Admit: 2016-06-26 | Discharge: 2016-06-26 | Disposition: A | Discharge status: Home / Self Care | Payer: BLUE CROSS/BLUE SHIELD | Source: Ambulatory Visit | Attending: Internal Medicine | Admitting: Internal Medicine

## 2016-06-26 ENCOUNTER — Ambulatory Visit (INDEPENDENT_AMBULATORY_CARE_PROVIDER_SITE_OTHER): Payer: BLUE CROSS/BLUE SHIELD | Admitting: Internal Medicine

## 2016-06-26 VITALS — BP 128/86 | HR 73 | Temp 97.7°F | Resp 12 | Wt 208.0 lb

## 2016-06-26 DIAGNOSIS — R05 Cough: Secondary | ICD-10-CM

## 2016-06-26 DIAGNOSIS — R059 Cough, unspecified: Secondary | ICD-10-CM

## 2016-06-26 DIAGNOSIS — Z23 Encounter for immunization: Secondary | ICD-10-CM | POA: Diagnosis not present

## 2016-06-26 DIAGNOSIS — R058 Other specified cough: Secondary | ICD-10-CM | POA: Insufficient documentation

## 2016-06-26 MED ORDER — HYDROCODONE-HOMATROPINE 5-1.5 MG/5ML PO SYRP: 5.0000 mL | ORAL_SOLUTION | Freq: Every evening | ORAL | 0 refills | Status: DC | PRN

## 2016-06-26 MED ORDER — DOXYCYCLINE HYCLATE 100 MG PO TABS: 100.0000 mg | ORAL_TABLET | Freq: Two times a day (BID) | ORAL | 0 refills | Status: DC

## 2016-06-26 NOTE — Assessment & Plan Note (Addendum)
Going on for 2 months No GERD or post nasal drip No wheezing and doesn't seem like bronchospasm Doesn't feel bad or look acutely ill--but mostly likely a low grade infection CXR looks normal  Will empirically treat with doxy and give cough syrup

## 2016-06-26 NOTE — Progress Notes (Signed)
Subjective:    Patient ID: Caleb Smith, male    DOB: 04/05/1951, 65 y.o.   MRN: [id number]  HPI Here due to persistent cough Feels good though Here at wife's urging  Seems to be coming up from chest Goes back a couple of months Never smoked--but did chew Mostly had been in the morning--now it is at night also Having some pain in chest with the cough Spells can last 10-15 minutes (mostly not in day)  No fever White phlegm No SOB Doesn't feel sick  Tylenol and cough drops--some help  Current Outpatient Prescriptions on File Prior to Visit  Medication Sig Dispense Refill  . aspirin 81 MG chewable tablet Chew 81 mg by mouth daily.    Marland Kitchen atorvastatin (LIPITOR) 20 MG tablet TAKE 1 TABLET BY MOUTH DAILY AT 6 PM 90 tablet 3  . omeprazole (PRILOSEC) 40 MG capsule Take 1 capsule (40 mg total) by mouth daily. 90 capsule 3  . SYNTHROID 100 MCG tablet TAKE 1 TABLET BY MOUTH DAILY BEFORE BREAKFAST 90 tablet 3   No current facility-administered medications on file prior to visit.     No Known Allergies  Past Medical History:  Diagnosis Date  . GERD (gastroesophageal reflux disease) 1995   s/p esoph dilation for stricture  . HLD (hyperlipidemia)   . Hypothyroidism   . Obesity 07/14/2014    Past Surgical History:  Procedure Laterality Date  . COLONOSCOPY  03/2003   small sigmoid polyp, rpt 5 yrs [name])  . ESOPHAGOGASTRODUODENOSCOPY  1995   s/p esophageal dilation  . FACIAL RECONSTRUCTION SURGERY  1990s   cow headbutted him  . FINGER SURGERY Left teenager   reattachment of 2nd,3rd,4th    Family History  Problem Relation Age of Onset  . Cancer Mother     breast  . CAD Maternal Uncle 28    massive MI  . Stroke Neg Hx   . Diabetes Neg Hx   . Hypertension Neg Hx   . Suicidality Father     suicide    Social History   Social History  . Marital status: Married    Spouse name: N/A  . Number of children: N/A  . Years of education: N/A   Occupational  History  . Not on file.   Social History Main Topics  . Smoking status: Never Smoker  . Smokeless tobacco: Former Systems developer    Types: Chew  . Alcohol use 0.0 oz/week     Comment: 2-3 beer daily  . Drug use: No  . Sexual activity: Not on file   Other Topics Concern  . Not on file   Social History Narrative   Lives with wife   Grown children   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[employer]   Edu: [employer]   Activity: active on farm   Diet: good water, fruits/vegetables daily   Review of Systems  No heartburn or dysphagia No allergy problems No post nasal drip No asthma in the past No wheezing     Objective:   Physical Exam  Constitutional: No distress.  Appears well  HENT:  Mouth/Throat: Oropharynx is clear and moist. No oropharyngeal exudate.  No sinus tenderness TMs normal Mild nasal inflammation  Neck: Normal range of motion. Neck supple. No thyromegaly present.  Pulmonary/Chest: Effort normal. No respiratory distress. He has no wheezes.  Transient crackles at right base ?dullness at right base Clear otherwise  Lymphadenopathy:    He has no cervical adenopathy.  Assessment & Plan:

## 2016-06-26 NOTE — Progress Notes (Signed)
Pre visit review using our clinic review tool, if applicable. No additional management support is needed unless otherwise documented below in the visit note. 

## 2016-07-02 DIAGNOSIS — H25013 Cortical age-related cataract, bilateral: Secondary | ICD-10-CM | POA: Diagnosis not present

## 2016-10-29 ENCOUNTER — Other Ambulatory Visit: Payer: Self-pay | Admitting: Family Medicine

## 2016-10-29 DIAGNOSIS — Z125 Encounter for screening for malignant neoplasm of prostate: Secondary | ICD-10-CM

## 2016-10-29 DIAGNOSIS — E039 Hypothyroidism, unspecified: Secondary | ICD-10-CM

## 2016-10-29 DIAGNOSIS — E785 Hyperlipidemia, unspecified: Secondary | ICD-10-CM

## 2016-10-30 ENCOUNTER — Other Ambulatory Visit: Payer: Self-pay | Admitting: Family Medicine

## 2016-10-31 ENCOUNTER — Other Ambulatory Visit (INDEPENDENT_AMBULATORY_CARE_PROVIDER_SITE_OTHER): Payer: BLUE CROSS/BLUE SHIELD

## 2016-10-31 DIAGNOSIS — Z125 Encounter for screening for malignant neoplasm of prostate: Secondary | ICD-10-CM

## 2016-10-31 DIAGNOSIS — E039 Hypothyroidism, unspecified: Secondary | ICD-10-CM

## 2016-10-31 DIAGNOSIS — E785 Hyperlipidemia, unspecified: Secondary | ICD-10-CM | POA: Diagnosis not present

## 2016-10-31 LAB — COMPREHENSIVE METABOLIC PANEL
ALBUMIN: 4.3 g/dL (ref 3.5–5.2)
ALT: 32 U/L (ref 0–53)
AST: 18 U/L (ref 0–37)
Alkaline Phosphatase: 52 U/L (ref 39–117)
BILIRUBIN TOTAL: 0.6 mg/dL (ref 0.2–1.2)
BUN: 15 mg/dL (ref 6–23)
CALCIUM: 9.3 mg/dL (ref 8.4–10.5)
CO2: 26 mEq/L (ref 19–32)
CREATININE: 1.31 mg/dL (ref 0.40–1.50)
Chloride: 106 mEq/L (ref 96–112)
GFR: 58.37 mL/min — AB (ref >60.00)
GLUCOSE: 106 mg/dL — AB (ref 70–99)
POTASSIUM: 4.4 meq/L (ref 3.5–5.1)
Sodium: 139 mEq/L (ref 135–145)
TOTAL PROTEIN: 6.6 g/dL (ref 6.0–8.3)

## 2016-10-31 LAB — TSH: TSH: 2.1 u[IU]/mL (ref 0.35–4.50)

## 2016-10-31 LAB — LIPID PANEL
CHOLESTEROL: 182 mg/dL (ref 0–200)
HDL: 61.1 mg/dL (ref >39.00)
LDL Cholesterol: 102 mg/dL — ABNORMAL HIGH (ref 0–99)
NONHDL: 121.21
TRIGLYCERIDES: 94 mg/dL (ref 0.0–149.0)
Total CHOL/HDL Ratio: 3
VLDL: 18.8 mg/dL (ref 0.0–40.0)

## 2016-10-31 LAB — PSA: PSA: 1.19 ng/mL (ref 0.10–4.00)

## 2016-10-31 NOTE — Telephone Encounter (Signed)
Next CPX 11/05/16.

## 2016-11-05 ENCOUNTER — Encounter: Payer: BLUE CROSS/BLUE SHIELD | Admitting: Family Medicine

## 2016-11-05 ENCOUNTER — Ambulatory Visit (INDEPENDENT_AMBULATORY_CARE_PROVIDER_SITE_OTHER): Payer: BLUE CROSS/BLUE SHIELD | Admitting: Family Medicine

## 2016-11-05 ENCOUNTER — Encounter: Payer: Self-pay | Admitting: Family Medicine

## 2016-11-05 VITALS — BP 120/70 | HR 54 | Ht 66.75 in | Wt 197.0 lb

## 2016-11-05 DIAGNOSIS — R600 Localized edema: Secondary | ICD-10-CM

## 2016-11-05 DIAGNOSIS — E669 Obesity, unspecified: Secondary | ICD-10-CM | POA: Diagnosis not present

## 2016-11-05 DIAGNOSIS — E785 Hyperlipidemia, unspecified: Secondary | ICD-10-CM | POA: Diagnosis not present

## 2016-11-05 DIAGNOSIS — Z1211 Encounter for screening for malignant neoplasm of colon: Secondary | ICD-10-CM | POA: Diagnosis not present

## 2016-11-05 DIAGNOSIS — Z0000 Encounter for general adult medical examination without abnormal findings: Secondary | ICD-10-CM

## 2016-11-05 DIAGNOSIS — E039 Hypothyroidism, unspecified: Secondary | ICD-10-CM

## 2016-11-05 DIAGNOSIS — K219 Gastro-esophageal reflux disease without esophagitis: Secondary | ICD-10-CM | POA: Diagnosis not present

## 2016-11-05 MED ORDER — ZOSTER VAC RECOMB ADJUVANTED 50 MCG/0.5ML IM SUSR: 0.5000 mL | Freq: Once | INTRAMUSCULAR | 1 refills | Status: AC

## 2016-11-05 NOTE — Assessment & Plan Note (Signed)
Discussed healthy diet changes.

## 2016-11-05 NOTE — Assessment & Plan Note (Signed)
Chronic stable. Continue current regimen. 

## 2016-11-05 NOTE — Progress Notes (Signed)
Pre visit review using our clinic review tool, if applicable. No additional management support is needed unless otherwise documented below in the visit note. 

## 2016-11-05 NOTE — Patient Instructions (Addendum)
Pass by lab to pick up stool kit.  Think about new 2 shot shingles series (shingrix) - check with [facility].  Watch sugars in the diet. Increase water You are doing well today. Continue current medicines. Return in 1 year for next physical or sooner if needed.   Health Maintenance, Male A healthy lifestyle and preventive care is important for your health and wellness. Ask your health care provider about what schedule of regular examinations is right for you. What should I know about weight and diet?  Eat a Healthy Diet  Eat plenty of vegetables, fruits, whole grains, low-fat dairy products, and lean protein.  Do not eat a lot of foods high in solid fats, added sugars, or salt. Maintain a Healthy Weight  Regular exercise can help you achieve or maintain a healthy weight. You should:  Do at least 150 minutes of exercise each week. The exercise should increase your heart rate and make you sweat (moderate-intensity exercise).  Do strength-training exercises at least twice a week. Watch Your Levels of Cholesterol and Blood Lipids  Have your blood tested for lipids and cholesterol every 5 years starting at 65 years of age. If you are at high risk for heart disease, you should start having your blood tested when you are 65 years old. You may need to have your cholesterol levels checked more often if:  Your lipid or cholesterol levels are high.  You are older than 65 years of age.  You are at high risk for heart disease. What should I know about cancer screening? Many types of cancers can be detected early and may often be prevented. Lung Cancer  You should be screened every year for lung cancer if:  You are a current smoker who has smoked for at least 30 years.  You are a former smoker who has quit within the past 15 years.  Talk to your health care provider about your screening options, when you should start screening, and how often you should be screened. Colorectal  Cancer  Routine colorectal cancer screening usually begins at 65 years of age and should be repeated every 5-10 years until you are 65 years old. You may need to be screened more often if early forms of precancerous polyps or small growths are found. Your health care provider may recommend screening at an earlier age if you have risk factors for colon cancer.  Your health care provider may recommend using home test kits to check for hidden blood in the stool.  A small camera at the end of a tube can be used to examine your colon (sigmoidoscopy or colonoscopy). This checks for the earliest forms of colorectal cancer. Prostate and Testicular Cancer  Depending on your age and overall health, your health care provider may do certain tests to screen for prostate and testicular cancer.  Talk to your health care provider about any symptoms or concerns you have about testicular or prostate cancer. Skin Cancer  Check your skin from head to toe regularly.  Tell your health care provider about any new moles or changes in moles, especially if:  There is a change in a mole's size, shape, or color.  You have a mole that is larger than a pencil eraser.  Always use sunscreen. Apply sunscreen liberally and repeat throughout the day.  Protect yourself by wearing long sleeves, pants, a wide-brimmed hat, and sunglasses when outside. What should I know about heart disease, diabetes, and high blood pressure?  If you are 18-39 years  of age, have your blood pressure checked every 3-5 years. If you are 54 years of age or older, have your blood pressure checked every year. You should have your blood pressure measured twice-once when you are at a hospital or clinic, and once when you are not at a hospital or clinic. Record the average of the two measurements. To check your blood pressure when you are not at a hospital or clinic, you can use:  An automated blood pressure machine at a pharmacy.  A home blood  pressure monitor.  Talk to your health care provider about your target blood pressure.  If you are between 31-51 years old, ask your health care provider if you should take aspirin to prevent heart disease.  Have regular diabetes screenings by checking your fasting blood sugar level.  If you are at a normal weight and have a low risk for diabetes, have this test once every three years after the age of 44.  If you are overweight and have a high risk for diabetes, consider being tested at a younger age or more often.  A one-time screening for abdominal aortic aneurysm (AAA) by ultrasound is recommended for men aged 52-75 years who are current or former smokers. What should I know about preventing infection? Hepatitis B  If you have a higher risk for hepatitis B, you should be screened for this virus. Talk with your health care provider to find out if you are at risk for hepatitis B infection. Hepatitis C  Blood testing is recommended for:  Everyone born from [birth year] through 1964.  Anyone with known risk factors for hepatitis C. Sexually Transmitted Diseases (STDs)  You should be screened each year for STDs including gonorrhea and chlamydia if:  You are sexually active and are younger than 65 years of age.  You are older than 65 years of age and your health care provider tells you that you are at risk for this type of infection.  Your sexual activity has changed since you were last screened and you are at an increased risk for chlamydia or gonorrhea. Ask your health care provider if you are at risk.  Talk with your health care provider about whether you are at high risk of being infected with HIV. Your health care provider may recommend a prescription medicine to help prevent HIV infection. What else can I do?  Schedule regular health, dental, and eye exams.  Stay current with your vaccines (immunizations).  Do not use any tobacco products, such as cigarettes, chewing tobacco, and  e-cigarettes. If you need help quitting, ask your health care provider.  Limit alcohol intake to no more than 2 drinks per day. One drink equals 12 ounces of beer, 5 ounces of wine, or 1 ounces of hard liquor.  Do not use street drugs.  Do not share needles.  Ask your health care provider for help if you need support or information about quitting drugs.  Tell your health care provider if you often feel depressed.  Tell your health care provider if you have ever been abused or do not feel safe at home. This information is not intended to replace advice given to you by your health care provider. Make sure you discuss any questions you have with your health care provider. Document Released: 12/13/2007 Document Revised: 02/13/2016 Document Reviewed: 03/20/2015 Elsevier Interactive Patient Education  2017 [employer].

## 2016-11-05 NOTE — Assessment & Plan Note (Signed)
Managed with daily PPI  

## 2016-11-05 NOTE — Assessment & Plan Note (Signed)
Preventative protocols reviewed and updated unless pt declined. Discussed healthy diet and lifestyle.  

## 2016-11-05 NOTE — Assessment & Plan Note (Signed)
Mild. Reviewed conservative measures - leg elevation, avoiding sodium, increased water. Update if worsening.

## 2016-11-05 NOTE — Progress Notes (Signed)
BP 120/70   Pulse (!) 54   Ht 5' 6.75" (1.695 m)   Wt 197 lb (89.4 kg)   SpO2 97%   BMI 31.09 kg/m    CC: CPE Subjective:    Patient ID: Caleb Smith, male    DOB: 1951-03-14, 66 y.o.   MRN: [id number]  HPI: Caleb Smith is a 66 y.o. male presenting on 11/05/2016 for Annual Exam (c/oright foot swelling off and on. )   GERD - controlled with daily omeprazole 52m. Breakthrough sxs with QOD dosing.  Intermittent R leg swelling. No pain or redness.   Preventative: COLONOSCOPY Date: 03/2003 small sigmoid polyp, rpt 5 yrs ([name]. Pt requests yearly stool kit.  Prostate cancer screening - discussed, DRE today 2018. If normal, consider spacing out.  Flu shot yearly Pneumovax 2013 Td 2011 Zostavax 2013 Shingrix - discussed, pt will [facility] Seat belt use discussed Sunscreen use discussed. No changing moles on skin. Non smoker.  Alcohol - 2-3 beers/day   Lives with wife Grown children Occupation: Gen foreman/lineman  Edu: HS Activity: active on farm  Diet: good water, fruits/vegetables daily   Relevant past medical, surgical, family and social history reviewed and updated as indicated. Interim medical history since our last visit reviewed. Allergies and medications reviewed and updated. Outpatient Medications Prior to Visit  Medication Sig Dispense Refill  . aspirin 81 MG chewable tablet Chew 81 mg by mouth daily.    .Marland Kitchenatorvastatin (LIPITOR) 20 MG tablet TAKE 1 TABLET BY MOUTH DAILY AT 6 PM 90 tablet 3  . omeprazole (PRILOSEC) 40 MG capsule Take 1 capsule (40 mg total) by mouth daily. 90 capsule 3  . SYNTHROID 100 MCG tablet TAKE 1 TABLET BY MOUTH DAILY BEFORE BREAKFAST 90 tablet 3  . doxycycline (VIBRA-TABS) 100 MG tablet Take 1 tablet (100 mg total) by mouth 2 (two) times daily. (Patient not taking: Reported on 11/05/2016) 14 tablet 0  . HYDROcodone-homatropine (HYCODAN) 5-1.5 MG/5ML syrup Take 5 mLs by mouth at bedtime as needed for cough.  (Patient not taking: Reported on 11/05/2016) 120 mL 0   No facility-administered medications prior to visit.      Per HPI unless specifically indicated in ROS section below Review of Systems  Constitutional: Negative for activity change, appetite change, chills, fatigue, fever and unexpected weight change.  HENT: Negative for hearing loss.   Eyes: Negative for visual disturbance.  Respiratory: Positive for cough (in am). Negative for chest tightness, shortness of breath and wheezing.   Cardiovascular: Positive for leg swelling (R>L, mild). Negative for chest pain and palpitations.  Gastrointestinal: Negative for abdominal distention, abdominal pain, blood in stool, constipation, diarrhea, nausea and vomiting.  Genitourinary: Negative for difficulty urinating and hematuria.  Musculoskeletal: Negative for arthralgias, myalgias and neck pain.  Skin: Negative for rash.  Neurological: Negative for dizziness, seizures, syncope and headaches.  Hematological: Negative for adenopathy. Does not bruise/bleed easily.  Psychiatric/Behavioral: Negative for dysphoric mood. The patient is not nervous/anxious.        Objective:    BP 120/70   Pulse (!) 54   Ht 5' 6.75" (1.695 m)   Wt 197 lb (89.4 kg)   SpO2 97%   BMI 31.09 kg/m   Wt Readings from Last 3 Encounters:  11/05/16 197 lb (89.4 kg)  06/26/16 208 lb (94.3 kg)  10/30/15 198 lb 4 oz (89.9 kg)    Physical Exam  Constitutional: He is oriented to person, place, and time. He appears well-developed and well-nourished.  No distress.  HENT:  Head: Normocephalic and atraumatic.  Right Ear: Hearing, tympanic membrane, external ear and ear canal normal.  Left Ear: Hearing, tympanic membrane, external ear and ear canal normal.  Nose: Nose normal.  Mouth/Throat: Uvula is midline, oropharynx is clear and moist and mucous membranes are normal. No oropharyngeal exudate, posterior oropharyngeal edema or posterior oropharyngeal erythema.  Eyes:  Conjunctivae and EOM are normal. Pupils are equal, round, and reactive to light. No scleral icterus.  Neck: Normal range of motion. Neck supple. No thyromegaly present.  Cardiovascular: Normal rate, regular rhythm, normal heart sounds and intact distal pulses.   No murmur heard. Pulses:      Radial pulses are 2+ on the right side, and 2+ on the left side.  Pulmonary/Chest: Effort normal and breath sounds normal. No respiratory distress. He has no wheezes. He has no rales.  Abdominal: Soft. Bowel sounds are normal. He exhibits no distension and no mass. There is no tenderness. There is no rebound and no guarding.  Genitourinary: Rectum normal and prostate normal. Rectal exam shows no external hemorrhoid, no internal hemorrhoid, no fissure, no mass, no tenderness and anal tone normal. Prostate is not enlarged (20gm) and not tender.  Musculoskeletal: Normal range of motion. He exhibits no edema.  Lymphadenopathy:    He has no cervical adenopathy.  Neurological: He is alert and oriented to person, place, and time.  CN grossly intact, station and gait intact  Skin: Skin is warm and dry. No rash noted.  Psychiatric: He has a normal mood and affect. His behavior is normal. Judgment and thought content normal.  Nursing note and vitals reviewed.  Results for orders placed or performed in visit on 10/31/16  Lipid panel  Result Value Ref Range   Cholesterol 182 0 - 200 mg/dL   Triglycerides 94.0 0.0 - 149.0 mg/dL   HDL 61.10 >39.00 mg/dL   VLDL 18.8 0.0 - 40.0 mg/dL   LDL Cholesterol 102 (H) 0 - 99 mg/dL   Total CHOL/HDL Ratio 3    NonHDL 121.21   Comprehensive metabolic panel  Result Value Ref Range   Sodium 139 135 - 145 mEq/L   Potassium 4.4 3.5 - 5.1 mEq/L   Chloride 106 96 - 112 mEq/L   CO2 26 19 - 32 mEq/L   Glucose, Bld 106 (H) 70 - 99 mg/dL   BUN 15 6 - 23 mg/dL   Creatinine, Ser 1.31 0.40 - 1.50 mg/dL   Total Bilirubin 0.6 0.2 - 1.2 mg/dL   Alkaline Phosphatase 52 39 - 117 U/L    AST 18 0 - 37 U/L   ALT 32 0 - 53 U/L   Total Protein 6.6 6.0 - 8.3 g/dL   Albumin 4.3 3.5 - 5.2 g/dL   Calcium 9.3 8.4 - 10.5 mg/dL   GFR 58.37 (L) >60.00 mL/min  TSH  Result Value Ref Range   TSH 2.10 0.35 - 4.50 uIU/mL  PSA  Result Value Ref Range   PSA 1.19 0.10 - 4.00 ng/mL      Assessment & Plan:   Problem List Items Addressed This Visit    GERD (gastroesophageal reflux disease)    Managed with daily PPI      Health maintenance examination - Primary    Preventative protocols reviewed and updated unless pt declined. Discussed healthy diet and lifestyle.       HLD (hyperlipidemia)    Chronic stable. Continue current regimen.       Hypothyroidism  Chronic, stable. Continue current regimen.      Obesity, Class I, BMI 30-34.9    Discussed healthy diet changes.      Pedal edema    Mild. Reviewed conservative measures - leg elevation, avoiding sodium, increased water. Update if worsening.        Other Visit Diagnoses    Special screening for malignant neoplasms, colon       Relevant Orders   Fecal occult blood, imunochemical       Follow up plan: Return in about 1 year (around 11/05/2017) for annual exam, prior fasting for blood work.  Ria Bush, MD

## 2016-11-05 NOTE — Assessment & Plan Note (Signed)
Chronic, stable. Continue current regimen. 

## 2016-11-26 ENCOUNTER — Other Ambulatory Visit: Payer: Self-pay | Admitting: Family Medicine

## 2017-05-10 DIAGNOSIS — Z23 Encounter for immunization: Secondary | ICD-10-CM | POA: Diagnosis not present

## 2017-06-17 ENCOUNTER — Other Ambulatory Visit: Payer: Self-pay

## 2017-06-17 MED ORDER — SYNTHROID 100 MCG PO TABS: ORAL_TABLET | ORAL | 1 refills | Status: DC

## 2017-06-26 ENCOUNTER — Ambulatory Visit
Admission: RE | Admit: 2017-06-26 | Discharge: 2017-06-26 | Disposition: A | Discharge status: Home / Self Care | Payer: BLUE CROSS/BLUE SHIELD | Source: Ambulatory Visit | Attending: Internal Medicine | Admitting: Internal Medicine

## 2017-06-26 ENCOUNTER — Telehealth: Payer: Self-pay | Admitting: Family Medicine

## 2017-06-26 ENCOUNTER — Encounter: Payer: Self-pay | Admitting: Internal Medicine

## 2017-06-26 ENCOUNTER — Ambulatory Visit: Payer: BLUE CROSS/BLUE SHIELD | Admitting: Internal Medicine

## 2017-06-26 DIAGNOSIS — R911 Solitary pulmonary nodule: Secondary | ICD-10-CM | POA: Insufficient documentation

## 2017-06-26 DIAGNOSIS — R05 Cough: Secondary | ICD-10-CM

## 2017-06-26 DIAGNOSIS — I700 Atherosclerosis of aorta: Secondary | ICD-10-CM | POA: Insufficient documentation

## 2017-06-26 DIAGNOSIS — I2510 Atherosclerotic heart disease of native coronary artery without angina pectoris: Secondary | ICD-10-CM | POA: Insufficient documentation

## 2017-06-26 DIAGNOSIS — R059 Cough, unspecified: Secondary | ICD-10-CM

## 2017-06-26 MED ORDER — HYDROCODONE-HOMATROPINE 5-1.5 MG/5ML PO SYRP: 5.0000 mL | ORAL_SOLUTION | Freq: Every evening | ORAL | 0 refills | Status: DC | PRN

## 2017-06-26 MED ORDER — AZITHROMYCIN 250 MG PO TABS: ORAL_TABLET | ORAL | 0 refills | Status: DC

## 2017-06-26 MED ORDER — ALBUTEROL SULFATE HFA 108 (90 BASE) MCG/ACT IN AERS: 1.0000 | INHALATION_SPRAY | Freq: Four times a day (QID) | RESPIRATORY_TRACT | 0 refills | Status: DC | PRN

## 2017-06-26 NOTE — Telephone Encounter (Signed)
[facility] radiology called to make MD awre report available for CT of chest.

## 2017-06-26 NOTE — Patient Instructions (Signed)
Please get CT chest asap  F/u with regular doctor if not better in 1-2 weeks otherwise by 10/2017  Happy New Year.   Cough, Adult A cough helps to clear your throat and lungs. A cough may last only 2-3 weeks (acute), or it may last longer than 8 weeks (chronic). Many different things can cause a cough. A cough may be a sign of an illness or another medical condition. Follow these instructions at home:  Pay attention to any changes in your cough.  Take medicines only as told by your doctor. ? If you were prescribed an antibiotic medicine, take it as told by your doctor. Do not stop taking it even if you start to feel better. ? Talk with your doctor before you try using a cough medicine.  Drink enough fluid to keep your pee (urine) clear or pale yellow.  If the air is dry, use a cold steam vaporizer or humidifier in your home.  Stay away from things that make you cough at work or at home.  If your cough is worse at night, try using extra pillows to raise your head up higher while you sleep.  Do not smoke, and try not to be around smoke. If you need help quitting, ask your doctor.  Do not have caffeine.  Do not drink alcohol.  Rest as needed. Contact a doctor if:  You have new problems (symptoms).  You cough up yellow fluid (pus).  Your cough does not get better after 2-3 weeks, or your cough gets worse.  Medicine does not help your cough and you are not sleeping well.  You have pain that gets worse or pain that is not helped with medicine.  You have a fever.  You are losing weight and you do not know why.  You have night sweats. Get help right away if:  You cough up blood.  You have trouble breathing.  Your heartbeat is very fast. This information is not intended to replace advice given to you by your health care provider. Make sure you discuss any questions you have with your health care provider. Document Released: 02/27/2011 Document Revised: 11/22/2015 Document  Reviewed: 08/23/2014 Elsevier Interactive Patient Education  2018 [employer].  Upper Respiratory Infection, Adult Most upper respiratory infections (URIs) are caused by a virus. A URI affects the nose, throat, and upper air passages. The most common type of URI is often called "the common cold." Follow these instructions at home:  Take medicines only as told by your doctor.  Gargle warm saltwater or take cough drops to comfort your throat as told by your doctor.  Use a warm mist humidifier or inhale steam from a shower to increase air moisture. This may make it easier to breathe.  Drink enough fluid to keep your pee (urine) clear or pale yellow.  Eat soups and other clear broths.  Have a healthy diet.  Rest as needed.  Go back to work when your fever is gone or your doctor says it is okay. ? You may need to stay home longer to avoid giving your URI to others. ? You can also wear a face mask and wash your hands often to prevent spread of the virus.  Use your inhaler more if you have asthma.  Do not use any tobacco products, including cigarettes, chewing tobacco, or electronic cigarettes. If you need help quitting, ask your doctor. Contact a doctor if:  You are getting worse, not better.  Your symptoms are not helped  by medicine.  You have chills.  You are getting more short of breath.  You have brown or red mucus.  You have yellow or brown discharge from your nose.  You have pain in your face, especially when you bend forward.  You have a fever.  You have puffy (swollen) neck glands.  You have pain while swallowing.  You have white areas in the back of your throat. Get help right away if:  You have very bad or constant: ? Headache. ? Ear pain. ? Pain in your forehead, behind your eyes, and over your cheekbones (sinus pain). ? Chest pain.  You have long-lasting (chronic) lung disease and any of the following: ? Wheezing. ? Long-lasting cough. ? Coughing  up blood. ? A change in your usual mucus.  You have a stiff neck.  You have changes in your: ? Vision. ? Hearing. ? Thinking. ? Mood. This information is not intended to replace advice given to you by your health care provider. Make sure you discuss any questions you have with your health care provider. Document Released: 12/03/2007 Document Revised: 02/17/2016 Document Reviewed: 09/21/2013 Elsevier Interactive Patient Education  2018 [employer].

## 2017-06-26 NOTE — Progress Notes (Signed)
Chief Complaint  Patient presents with  . Cough   Acute visit  1. He reports cough persistent x 1 year CXR negative last 05/2016 but worse in the last week and productive with yellow sputum. Cough worse with lying down and he has been hoarse new this week. Sx's started 12/25. No fever, chills, +chest tightness. grandkids may have been sick. His head is hurting so much from coughing. Denies nasal congestion/runny nose. Denies h/o asthma, bronchitis. He has never been a smoker but did used chew tobacco in the past.  He has tried Mucinex OTC and sinus cold w/o relief.    Review of Systems  Constitutional: Negative for chills and fever.  HENT: Negative for hearing loss.   Eyes:       No vision sx's   Respiratory: Positive for sputum production and shortness of breath.   Cardiovascular:       +chest tightness with coughing  Gastrointestinal: Negative for abdominal pain.  Musculoskeletal: Negative for falls.  Skin: Negative for rash.  Neurological: Positive for headaches.  Psychiatric/Behavioral: Negative for memory loss.   Past Medical History:  Diagnosis Date  . GERD (gastroesophageal reflux disease) 1995   s/p esoph dilation for stricture  . HLD (hyperlipidemia)   . Hypothyroidism   . Obesity 07/14/2014   Past Surgical History:  Procedure Laterality Date  . COLONOSCOPY  03/2003   small sigmoid polyp, rpt 5 yrs [name])  . ESOPHAGOGASTRODUODENOSCOPY  1995   s/p esophageal dilation  . FACIAL RECONSTRUCTION SURGERY  1990s   cow headbutted him  . FINGER SURGERY Left teenager   reattachment of 2nd,3rd,4th   Family History  Problem Relation Age of Onset  . Cancer Mother        breast  . CAD Maternal Uncle 15       massive MI  . Suicidality Father        suicide  . Stroke Neg Hx   . Diabetes Neg Hx   . Hypertension Neg Hx    Social History   Socioeconomic History  . Marital status: Married    Spouse name: Not on file  . Number of children: Not on file  . Years of  education: Not on file  . Highest education level: Not on file  Social Needs  . Financial resource strain: Not on file  . Food insecurity - worry: Not on file  . Food insecurity - inability: Not on file  . Transportation needs - medical: Not on file  . Transportation needs - non-medical: Not on file  Occupational History  . Not on file  Tobacco Use  . Smoking status: Never Smoker  . Smokeless tobacco: Former Systems developer    Types: Chew  Substance and Sexual Activity  . Alcohol use: Yes    Alcohol/week: 0.0 oz    Comment: 2-3 beer daily  . Drug use: No  . Sexual activity: Not on file  Other Topics Concern  . Not on file  Social History Narrative   Lives with wife   Grown children   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[employer]   Edu: [employer]   Activity: active on farm   Diet: good water, fruits/vegetables daily   Current Meds  Medication Sig  . aspirin 81 MG chewable tablet Chew 81 mg by mouth daily.  Marland Kitchen atorvastatin (LIPITOR) 20 MG tablet TAKE 1 TABLET BY MOUTH DAILY AT 6 IN THEEVENING  . cholecalciferol (VITAMIN D) 1000 units tablet Take 1,000 Units by mouth daily.  Marland Kitchen dextromethorphan-guaiFENesin (  MUCINEX DM) 30-600 MG 12hr tablet Take 1 tablet by mouth 2 (two) times daily.  Marland Kitchen omeprazole (PRILOSEC) 40 MG capsule TAKE ONE CAPSULE BY MOUTH DAILY  . SYNTHROID 100 MCG tablet TAKE 1 TABLET BY MOUTH DAILY BEFORE BREAKFAST  . vitamin B-12 (CYANOCOBALAMIN) 500 MCG tablet Take 500 mcg by mouth daily.   No Known Allergies No results found for this or any previous visit (from the past 2160 hour(s)). Objective  Body mass index is 31.26 kg/m. Wt Readings from Last 3 Encounters:  06/26/17 198 lb 2 oz (89.9 kg)  11/05/16 197 lb (89.4 kg)  06/26/16 208 lb (94.3 kg)   Temp Readings from Last 3 Encounters:  06/26/17 98.6 F (37 C) (Oral)  06/26/16 97.7 F (36.5 C) (Oral)  10/30/15 98 F (36.7 C) (Oral)   BP Readings from Last 3 Encounters:  06/26/17 130/80  11/05/16 120/70  06/26/16  128/86   Pulse Readings from Last 3 Encounters:  06/26/17 72  11/05/16 (!) 54  06/26/16 73   O2 sat 95% room air   Physical Exam  Constitutional: He is oriented to person, place, and time and well-developed, well-nourished, and in no distress. Vital signs are normal.  HENT:  Head: Normocephalic and atraumatic.  Mouth/Throat: Oropharynx is clear and moist and mucous membranes are normal.  Eyes: Conjunctivae are normal. Pupils are equal, round, and reactive to light.  Cardiovascular: Normal rate, regular rhythm and normal heart sounds.  Pulmonary/Chest: Effort normal and breath sounds normal.  Neurological: He is alert and oriented to person, place, and time. Gait normal. Gait normal.  Skin: Skin is warm, dry and intact.  Psychiatric: Mood, memory, affect and judgment normal.  Nursing note and vitals reviewed.   Assessment   1. Persistent cough x 1 year worse x 1 week could be URI r/o other with CT chest. Pt has h/o chew tobacco  Plan  1. Cold handout, zpack, prn Albuterol, hydcodan qhs prn.  CT chest today w/o contrast if cough does not improve f/u with PCP in 1-2 weeks and consider pulm referral if CT neg.   UTD on vx except shingrix, prevnar will hold until cough better.  Reg f/u with PCP due 10/2017   Provider: Dr. Olivia Mackie McLean-Scocuz[facility]

## 2017-06-28 ENCOUNTER — Encounter: Payer: Self-pay | Admitting: Family Medicine

## 2017-06-28 DIAGNOSIS — R911 Solitary pulmonary nodule: Secondary | ICD-10-CM | POA: Insufficient documentation

## 2017-07-03 ENCOUNTER — Ambulatory Visit: Payer: BLUE CROSS/BLUE SHIELD | Admitting: Family Medicine

## 2017-07-03 ENCOUNTER — Encounter: Payer: Self-pay | Admitting: Family Medicine

## 2017-07-03 VITALS — BP 122/66 | HR 54 | Temp 98.2°F | Wt 200.0 lb

## 2017-07-03 DIAGNOSIS — R05 Cough: Secondary | ICD-10-CM

## 2017-07-03 DIAGNOSIS — Z23 Encounter for immunization: Secondary | ICD-10-CM | POA: Diagnosis not present

## 2017-07-03 DIAGNOSIS — R911 Solitary pulmonary nodule: Secondary | ICD-10-CM | POA: Diagnosis not present

## 2017-07-03 DIAGNOSIS — I700 Atherosclerosis of aorta: Secondary | ICD-10-CM | POA: Insufficient documentation

## 2017-07-03 DIAGNOSIS — I2510 Atherosclerotic heart disease of native coronary artery without angina pectoris: Secondary | ICD-10-CM | POA: Diagnosis not present

## 2017-07-03 DIAGNOSIS — R053 Chronic cough: Secondary | ICD-10-CM

## 2017-07-03 NOTE — Assessment & Plan Note (Addendum)
Found on recent CT chest. Pt denies cardiovascular symptoms. Last EKG 2013. Will refer to cards per pt/wife request. Continue statin.

## 2017-07-03 NOTE — Assessment & Plan Note (Addendum)
Ongoing chronic cough >1 yr worse in AM. CXR from 2017 and CT from 2018 reviewed, reassuring. Takes daily PPI with good control of GERD. Albuterol inhaler has also seemed to help. Suggested trials nightly zantac and zyrtec to treat possible hidden GERD or allergic rhinitis as cause of cough. Will refer to pulm for further eval. Pt agrees with plan.  [id number] today.

## 2017-07-03 NOTE — Assessment & Plan Note (Signed)
40mm RML nodule found last week. Low risk for lung cancer - will just watch for now.

## 2017-07-03 NOTE — Progress Notes (Addendum)
BP 122/66 (BP Location: Left Arm, Patient Position: Sitting, Cuff Size: Normal)   Pulse (!) 54   Temp 98.2 F (36.8 C) (Oral)   Wt 200 lb (90.7 kg)   SpO2 95%   BMI 31.56 kg/m    CC: f/u visit Subjective:    Patient ID: Caleb Smith, male    DOB: 11/15/1950, 67 y.o.   MRN: [id number]  HPI: Caleb Smith is a 67 y.o. male presenting on 07/03/2017 for Follow-up   Here with wife Santiago Glad.   Pt endorses chronic cough over years, worse first thing in the morning. Cough largely non productive. Certain smells tend to trigger cough (bacon). No trouble with sleep at night.   Saw Dr Aundra Dubin last week with persistent cough x1 yr, worse 1 week. Work up included normal chest CT (CAD, aort ATH, small 89mm RML nodule)). Treated with zpack, hycodan and albuterol for possible URI.   Albuterol inhaler has seemed to help with cough. Hycodan has helped some as well. He las used albuterol this morning.   Known GERD controlled with daily PPI.  Denies significant allergic rhinitis.   Relevant past medical, surgical, family and social history reviewed and updated as indicated. Interim medical history since our last visit reviewed. Allergies and medications reviewed and updated. Outpatient Medications Prior to Visit  Medication Sig Dispense Refill  . albuterol (PROVENTIL HFA;VENTOLIN HFA) 108 (90 Base) MCG/ACT inhaler Inhale 1-2 puffs into the lungs every 6 (six) hours as needed for wheezing or shortness of breath (chest tightnes, cough). 1 Inhaler 0  . aspirin 81 MG chewable tablet Chew 81 mg by mouth daily.    Marland Kitchen atorvastatin (LIPITOR) 20 MG tablet TAKE 1 TABLET BY MOUTH DAILY AT 6 IN THEEVENING 90 tablet 3  . cholecalciferol (VITAMIN D) 1000 units tablet Take 1,000 Units by mouth daily.    Marland Kitchen dextromethorphan-guaiFENesin (MUCINEX DM) 30-600 MG 12hr tablet Take 1 tablet by mouth 2 (two) times daily.    Marland Kitchen HYDROcodone-homatropine (HYCODAN) 5-1.5 MG/5ML syrup Take 5 mLs by mouth at bedtime as  needed for cough. 473 mL 0  . omeprazole (PRILOSEC) 40 MG capsule TAKE ONE CAPSULE BY MOUTH DAILY 90 capsule 3  . SYNTHROID 100 MCG tablet TAKE 1 TABLET BY MOUTH DAILY BEFORE BREAKFAST 90 tablet 1  . vitamin B-12 (CYANOCOBALAMIN) 500 MCG tablet Take 500 mcg by mouth daily.    Marland Kitchen azithromycin (ZITHROMAX) 250 MG tablet 2 pills today and 1 pill day 2-5 6 tablet 0   No facility-administered medications prior to visit.      Per HPI unless specifically indicated in ROS section below Review of Systems     Objective:    BP 122/66 (BP Location: Left Arm, Patient Position: Sitting, Cuff Size: Normal)   Pulse (!) 54   Temp 98.2 F (36.8 C) (Oral)   Wt 200 lb (90.7 kg)   SpO2 95%   BMI 31.56 kg/m   Wt Readings from Last 3 Encounters:  07/03/17 200 lb (90.7 kg)  06/26/17 198 lb 2 oz (89.9 kg)  11/05/16 197 lb (89.4 kg)    Physical Exam  Constitutional: He appears well-developed and well-nourished. No distress.  HENT:  Head: Normocephalic and atraumatic.  Right Ear: Hearing, tympanic membrane, external ear and ear canal normal.  Left Ear: Hearing, tympanic membrane, external ear and ear canal normal.  Nose: Nose normal. No mucosal edema or rhinorrhea. Right sinus exhibits no maxillary sinus tenderness and no frontal sinus tenderness. Left sinus exhibits no maxillary  sinus tenderness and no frontal sinus tenderness.  Mouth/Throat: Uvula is midline, oropharynx is clear and moist and mucous membranes are normal. No oropharyngeal exudate, posterior oropharyngeal edema, posterior oropharyngeal erythema or tonsillar abscesses.  Eyes: Conjunctivae and EOM are normal. Pupils are equal, round, and reactive to light. No scleral icterus.  Neck: Normal range of motion. Neck supple.  Cardiovascular: Normal rate, regular rhythm, normal heart sounds and intact distal pulses.  No murmur heard. Pulmonary/Chest: Effort normal and breath sounds normal. No respiratory distress. He has no wheezes. He has no  rales.  Lymphadenopathy:    He has no cervical adenopathy.  Skin: Skin is warm and dry. No rash noted.  Nursing note and vitals reviewed.  Results for orders placed or performed in visit on 10/31/16  Lipid panel  Result Value Ref Range   Cholesterol 182 0 - 200 mg/dL   Triglycerides 94.0 0.0 - 149.0 mg/dL   HDL 61.10 >39.00 mg/dL   VLDL 18.8 0.0 - 40.0 mg/dL   LDL Cholesterol 102 (H) 0 - 99 mg/dL   Total CHOL/HDL Ratio 3    NonHDL 121.21   Comprehensive metabolic panel  Result Value Ref Range   Sodium 139 135 - 145 mEq/L   Potassium 4.4 3.5 - 5.1 mEq/L   Chloride 106 96 - 112 mEq/L   CO2 26 19 - 32 mEq/L   Glucose, Bld 106 (H) 70 - 99 mg/dL   BUN 15 6 - 23 mg/dL   Creatinine, Ser 1.31 0.40 - 1.50 mg/dL   Total Bilirubin 0.6 0.2 - 1.2 mg/dL   Alkaline Phosphatase 52 39 - 117 U/L   AST 18 0 - 37 U/L   ALT 32 0 - 53 U/L   Total Protein 6.6 6.0 - 8.3 g/dL   Albumin 4.3 3.5 - 5.2 g/dL   Calcium 9.3 8.4 - 10.5 mg/dL   GFR 58.37 (L) >60.00 mL/min  TSH  Result Value Ref Range   TSH 2.10 0.35 - 4.50 uIU/mL  PSA  Result Value Ref Range   PSA 1.19 0.10 - 4.00 ng/mL      Assessment & Plan:   Problem List Items Addressed This Visit    Atherosclerosis of aorta (HCC)   CAD (coronary artery disease)    Found on recent CT chest. Pt denies cardiovascular symptoms. Last EKG 2013. Will refer to cards per pt/wife request. Continue statin.       Relevant Orders   Ambulatory referral to Cardiology   Chronic cough - Primary    Ongoing chronic cough >1 yr worse in AM. CXR from 2017 and CT from 2018 reviewed, reassuring. Takes daily PPI with good control of GERD. Albuterol inhaler has also seemed to help. Suggested trials nightly zantac and zyrtec to treat possible hidden GERD or allergic rhinitis as cause of cough. Will refer to pulm for further eval. Pt agrees with plan.  [id number] today.      Relevant Orders   Ambulatory referral to Pulmonology   Lung nodule, solitary    65mm RML  nodule found last week. Low risk for lung cancer - will just watch for now.           Follow up plan: Return if symptoms worsen or fail to improve.  Ria Bush, MD

## 2017-07-03 NOTE — Addendum Note (Signed)
Addended by: Brenton Grills on: 02/04/3373 45:14 AM   Modules accepted: Orders

## 2017-07-03 NOTE — Patient Instructions (Addendum)
Continue albuterol inhaler as needed for cough. Add zyrtec nightly (allergy mediine) for 2 week trial to see if any improvement in cough.  Afterwards, add zantac at night (heartburn medicine) to help with possible hidden heartburn.  We will refer you to lung doctor for further evaluation of chronic cough.  We will refer you to heart doctor for further evaluation as well.

## 2017-07-24 ENCOUNTER — Ambulatory Visit: Payer: BLUE CROSS/BLUE SHIELD | Admitting: Internal Medicine

## 2017-07-24 ENCOUNTER — Encounter: Payer: Self-pay | Admitting: Internal Medicine

## 2017-07-24 ENCOUNTER — Other Ambulatory Visit (INDEPENDENT_AMBULATORY_CARE_PROVIDER_SITE_OTHER): Payer: BLUE CROSS/BLUE SHIELD

## 2017-07-24 VITALS — BP 140/78 | HR 65 | Ht 66.75 in | Wt 201.2 lb

## 2017-07-24 DIAGNOSIS — R053 Chronic cough: Secondary | ICD-10-CM

## 2017-07-24 DIAGNOSIS — R05 Cough: Secondary | ICD-10-CM

## 2017-07-24 DIAGNOSIS — R911 Solitary pulmonary nodule: Secondary | ICD-10-CM

## 2017-07-24 DIAGNOSIS — R059 Cough, unspecified: Secondary | ICD-10-CM

## 2017-07-24 LAB — CBC WITH DIFFERENTIAL/PLATELET
BASOS ABS: 0 10*3/uL (ref 0.0–0.1)
Basophils Relative: 0.8 % (ref 0.0–3.0)
Eosinophils Absolute: 0.2 10*3/uL (ref 0.0–0.7)
Eosinophils Relative: 3 % (ref 0.0–5.0)
HEMATOCRIT: 47 % (ref 39.0–52.0)
Hemoglobin: 15.7 g/dL (ref 13.0–17.0)
LYMPHS PCT: 31.2 % (ref 12.0–46.0)
Lymphs Abs: 1.8 10*3/uL (ref 0.7–4.0)
MCHC: 33.3 g/dL (ref 30.0–36.0)
MCV: 92.8 fl (ref 78.0–100.0)
MONOS PCT: 12.5 % — AB (ref 3.0–12.0)
Monocytes Absolute: 0.7 10*3/uL (ref 0.1–1.0)
Neutro Abs: 3.1 10*3/uL (ref 1.4–7.7)
Neutrophils Relative %: 52.5 % (ref 43.0–77.0)
Platelets: 246 10*3/uL (ref 150.0–400.0)
RBC: 5.06 Mil/uL (ref 4.22–5.81)
RDW: 13.7 % (ref 11.5–15.5)
WBC: 5.9 10*3/uL (ref 4.0–10.5)

## 2017-07-24 NOTE — Patient Instructions (Addendum)
Continue omeprazole 30 min  before bfast daily and add pepcid 20 mg in evening before bed   GERD (REFLUX)  is an extremely common cause of respiratory symptoms just like yours , many times with no obvious heartburn at all.    It can be treated with medication, but also with lifestyle changes including elevation of the head of your bed (ideally with 6 inch  bed blocks),  Smoking cessation, avoidance of late meals, excessive alcohol, and avoid fatty foods, chocolate, peppermint, colas, red wine, and acidic juices such as orange juice.  NO MINT OR MENTHOL PRODUCTS SO NO COUGH DROPS   USE SUGARLESS CANDY INSTEAD (Jolley ranchers or Stover's or Life Savers) or even ice chips will also do - the key is to swallow to prevent all throat clearing. NO OIL BASED VITAMINS - use powdered substitutes.    Please see patient coordinator before you leave today  to schedule sinus CT    If the am cough continues and the ct scan of sinuses is negative >  For drainage / throat tickle try take CHLORPHENIRAMINE  4 mg - take one every 4 hours as needed - available over the counter- may cause drowsiness so start with just a bedtime dose or two and see how you tolerate it before trying in daytime       Please remember to go to the lab department downstairs in the basement  for your tests - we will call you with the results when they are available.     We will call you in a year to make sure you had a ct chest - pulmonary follow up is as needed

## 2017-07-24 NOTE — Progress Notes (Signed)
Subjective:    Patient ID: Caleb Smith, male    DOB: 03/12/1951,     MRN: [id number]  HPI  38 yowm never smoker only passive exp from father  with new onset cough since 2016 esp first thing in am and sporadic the rest of the day referred to pulmonary clinic 07/24/2017 by Dr   Danise Mina with abn ct    07/24/2017 1st [facility] office visit/ Kymberli Wiegand   Chief Complaint  Patient presents with  . Pulmonary Consult    referred by Dr. Armanda Magic, cough, nodule on right lung  minimally productive cough x 2 years worse in winter occ assoc with watery rhinitis  Acutely ill around xmas 2018 with hoarseness/ severe cough min productive slt yellow rx cough med/ nasal sprays/inhaler zpak  no better. Has been on omeprazole x > decade s/p stretching x sev times by [employer] last (has been retired x ? A decade prior to OV  ) No assoc wheezing or sob/ Not limited by breathing from desired activities     No obvious day to day or daytime variability or assoc or mucus plugs or hemoptysis or cp or chest tightness, subjective wheeze or overt   hb symptoms. No unusual exposure hx or h/o childhood pna/ asthma or knowledge of premature birth.  Sleeping ok flat without nocturnal   exacerbation  of respiratory  c/o's or need for noct saba. Also denies any obvious fluctuation of symptoms with weather or environmental changes or other aggravating or alleviating factors except as outlined above   Current Allergies, Complete Past Medical History, Past Surgical History, Family History, and Social History were reviewed in [employer] record.     Current Meds  Medication Sig  . aspirin 81 MG chewable tablet Chew 81 mg by mouth daily.  Marland Kitchen atorvastatin (LIPITOR) 20 MG tablet TAKE 1 TABLET BY MOUTH DAILY AT 6 IN THEEVENING  . cholecalciferol (VITAMIN D) 1000 units tablet Take 1,000 Units by mouth daily.  Marland Kitchen HYDROcodone-homatropine (HYCODAN) 5-1.5 MG/5ML syrup Take 5 mLs by mouth at bedtime as  needed for cough.  Marland Kitchen omeprazole (PRILOSEC) 40 MG capsule TAKE ONE CAPSULE BY MOUTH DAILY  . SYNTHROID 100 MCG tablet TAKE 1 TABLET BY MOUTH DAILY BEFORE BREAKFAST  . vitamin B-12 (CYANOCOBALAMIN) 500 MCG tablet Take 500 mcg by mouth daily.          Review of Systems  Constitutional: Negative for fever and unexpected weight change.  HENT: Positive for sneezing. Negative for congestion, dental problem, ear pain, nosebleeds, postnasal drip, rhinorrhea, sinus pressure, sore throat and trouble swallowing.   Eyes: Negative for redness and itching.  Respiratory: Positive for cough. Negative for chest tightness, shortness of breath and wheezing.   Cardiovascular: Negative for palpitations and leg swelling.  Gastrointestinal: Negative for nausea and vomiting.  Genitourinary: Negative for dysuria.  Musculoskeletal: Negative for joint swelling.  Skin: Negative for rash.  Neurological: Negative for headaches.  Hematological: Does not bruise/bleed easily.  Psychiatric/Behavioral: Negative for dysphoric mood. The patient is not nervous/anxious.        Objective:   Physical Exam  amb mildly hoarse wm nad   Wt Readings from Last 3 Encounters:  07/24/17 201 lb 3.2 oz (91.3 kg)  07/03/17 200 lb (90.7 kg)  06/26/17 198 lb 2 oz (89.9 kg)     Vital signs reviewed - Note on arrival 02 sats  99 % on RA      HEENT: nl    oropharynx. Nl external  ear canals without cough reflex - bottom plate - moderate bilateral non-specific turbinate edema  With mp secretions    NECK :  without JVD/Nodes/TM/ nl carotid upstrokes bilaterally   LUNGS: no acc muscle use,  Nl contour chest which is clear to A and P bilaterally without cough on insp or exp maneuvers   CV:  RRR  no s3 or murmur or increase in P2, and no edema   ABD:  soft and nontender with nl inspiratory excursion in the supine position. No bruits or organomegaly appreciated, bowel sounds nl  MS:  Nl gait/ ext warm without deformities, calf  tenderness, cyanosis or clubbing No obvious joint restrictions   SKIN: warm and dry without lesions    NEURO:  alert, approp, nl sensorium with  no motor or cerebellar deficits apparent.       I personally reviewed images and agree with radiology impression as follows:   Chest CT 06/26/17  1. No acute cardiopulmonary abnormalities. 2. Aortic Atherosclerosis (ICD10-I70.0). Three vessel coronary artery calcification as well as left main disease. 3. 5 mm right middle lobe lung nodule. No follow-up needed if patient is low-risk.   Labs ordered 07/24/2017  Allergy profile      Assessment & Plan:

## 2017-07-25 ENCOUNTER — Encounter: Payer: Self-pay | Admitting: Internal Medicine

## 2017-07-25 NOTE — Assessment & Plan Note (Addendum)
06/26/2017 5 mm right middle lobe lung nodule> in computer for recall 06/26/18  CT results reviewed with pt >>> Too small for PET or bx, not suspicious enough for excisional bx > really only option for now is follow the Fleischner society guidelines as rec by radiology = f/u optional in one year as his only risk is passive smoke exp   Discussed in detail all the  indications, usual  risks and alternatives  relative to the benefits with patient who agrees to proceed with conservative f/u as outlined     Total time devoted to counseling  > 50 % of initial 60 min office visit:  review case with pt/ discussion of options/alternatives/ personally creating written customized instructions  in presence of pt  then going over those specific  Instructions directly with the pt including how to use all of the meds but in particular covering each new medication in detail and the difference between the maintenance= "automatic" meds and the prns using an action plan format for the latter (If this problem/symptom => do that organization reading Left to right).  Please see AVS from this visit for a full list of these instructions which I personally wrote for this pt and  are unique to this visit.

## 2017-07-25 NOTE — Assessment & Plan Note (Addendum)
Onset xmas 2016 with assoc watery rhinitis - flared since xmas 2018  -  Allergy profile 07/24/2017 >  Eos 0.2 /  IgE  pending - Sinus ct ordered    The most common causes of chronic cough in immunocompetent adults include the following: upper airway cough syndrome (UACS), previously referred to as postnasal drip syndrome (PNDS), which is caused by variety of rhinosinus conditions; (2) asthma; (3) GERD; (4) chronic bronchitis from cigarette smoking or other inhaled environmental irritants; (5) nonasthmatic eosinophilic bronchitis; and (6) bronchiectasis.   These conditions, singly or in combination, have accounted for up to 94% of the causes of chronic cough in prospective studies.   Other conditions have constituted no >6% of the causes in prospective studies These have included bronchogenic carcinoma, chronic interstitial pneumonia, sarcoidosis, left ventricular failure, ACEI-induced cough, and aspiration from a condition associated with pharyngeal dysfunction.    Chronic cough is often simultaneously caused by more than one condition. A single cause has been found from 38 to 82% of the time, multiple causes from 18 to 62%. Multiply caused cough has been the result of three diseases up to 42% of the time.       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   These are the same pts (now being labeled as having "irritable larynx syndrome" by some cough centers) who not infrequently have a history of having failed to tolerate ace inhibitors,  dry  powder inhalers or biphosphonates or report having atypical/extraesophageal reflux symptoms that don't respond to standard doses of PPI  and are easily confused as having aecopd or asthma flares by even experienced allergists/ pulmonologists (myself included).   Of the three most common causes of  Sub-acute or recurrent or chronic cough, only one (GERD)  can actually contribute to/ trigger  the other two (asthma and post nasal drip syndrome)  and perpetuate the cylce of cough.  While not intuitively obvious, many patients with chronic low grade reflux do not cough until there is a primary insult that disturbs the protective epithelial barrier and exposes sensitive nerve endings.   This is typically viral as was likely the case here  but can be due to pnds from sinus dz so needs ct sinus to be complete     The point is that once this occurs, it is difficult to eliminate the cycle  using anything but a maximally effective acid suppression regimen at least in the short run, accompanied by an appropriate diet to address non acid GERD and control / eliminate the cough itself for at least 3 days.    Rec max rx for gerd/ add 1st gen H1 blockers per guidelines  If sinus ct neg and f/u in 6 weeks prn    Reviewed with pt: The standardized cough guidelines published in Chest by Lissa Morales in 2006 are still the best available and consist of a multiple step process (up to 12!) , not a single office visit,  and are intended  to address this problem logically,  with an  alogrithm dependent on response to empiric treatment at  each progressive step  to determine a specific diagnosis with  minimal addtional testing needed. Therefore if adherence is an issue or can't be accurately verified,  it's very unlikely the standard evaluation and treatment will be successful here.    Furthermore, response to therapy (other than acute cough suppression, which should only be used short term with avoidance of narcotic containing  cough syrups if possible), can be a gradual process for which the patient is not likely to  perceive immediate benefit.  Unlike going to an eye doctor where the best perscription is almost always the first one and is immediately effective, this is almost never the case in the management of chronic cough syndromes. Therefore the patient needs to commit up front to consistently adhere to recommendations  for up to 6 weeks of therapy directed at the likely underlying problem(s) before the response can be reasonably evaluated.

## 2017-07-27 LAB — RESPIRATORY ALLERGY PROFILE REGION II ~~LOC~~
ALLERGEN, COTTONWOOD, T14: 0.13 kU/L — AB
ALLERGEN, OAK, T7: 0.12 kU/L — AB
Allergen, A. alternata, m6: <0.1 kU/L
Allergen, Cedar tree, t12: 0.14 kU/L — ABNORMAL HIGH
Allergen, Comm Silver Birch, t9: <0.1 kU/L
Allergen, D pternoyssinus,d7: <0.1 kU/L
Allergen, Mouse Urine Protein, e78: <0.1 kU/L
Allergen, Mulberry, t76: <0.1 kU/L
Allergen, P. notatum, m1: <0.1 kU/L
BERMUDA GRASS: 0.14 kU/L — AB
Box Elder IgE: 0.13 kU/L — ABNORMAL HIGH
CLASS: 0
CLASS: 0
CLASS: 0
CLASS: 0
CLASS: 0
COCKROACH: 0.11 kU/L — AB
COMMON RAGWEED (SHORT) (W1) IGE: 0.13 kU/L — AB
Cat Dander: <0.1 kU/L
Class: 0
Class: 0
Class: 0
Class: 0
Class: 0
Class: 0
Class: 0
Class: 0
Class: 0
Class: 0
Class: 0
Class: 0
Class: 0
Class: 0
Class: 0
Class: 0
Class: 0
Class: 0
Class: 0
D. farinae: <0.1 kU/L
Elm IgE: 0.14 kU/L — ABNORMAL HIGH
IgE (Immunoglobulin E), Serum: 67 kU/L (ref <=114)
Johnson Grass: 0.15 kU/L — ABNORMAL HIGH
Rough Pigweed  IgE: <0.1 kU/L
SHEEP SORREL IGE: 0.14 kU/L — AB
TIMOTHY GRASS: 0.11 kU/L — AB

## 2017-07-27 LAB — INTERPRETATION:

## 2017-07-28 NOTE — Progress Notes (Signed)
Spoke with pt and notified of results per Dr. Wert. Pt verbalized understanding and denied any questions. 

## 2017-07-31 HISTORY — PX: CARDIOVASCULAR STRESS TEST: SHX262

## 2017-08-13 ENCOUNTER — Encounter: Payer: Self-pay | Admitting: Cardiology

## 2017-08-21 ENCOUNTER — Encounter: Payer: Self-pay | Admitting: Cardiology

## 2017-08-21 ENCOUNTER — Ambulatory Visit (INDEPENDENT_AMBULATORY_CARE_PROVIDER_SITE_OTHER)
Admission: RE | Admit: 2017-08-21 | Discharge: 2017-08-21 | Disposition: A | Discharge status: Home / Self Care | Payer: BLUE CROSS/BLUE SHIELD | Source: Ambulatory Visit | Attending: Internal Medicine | Admitting: Internal Medicine

## 2017-08-21 ENCOUNTER — Other Ambulatory Visit: Payer: Self-pay | Admitting: Internal Medicine

## 2017-08-21 ENCOUNTER — Ambulatory Visit: Payer: BLUE CROSS/BLUE SHIELD | Admitting: Cardiology

## 2017-08-21 VITALS — BP 132/76 | HR 51 | Ht 66.0 in | Wt 198.4 lb

## 2017-08-21 DIAGNOSIS — E7800 Pure hypercholesterolemia, unspecified: Secondary | ICD-10-CM

## 2017-08-21 DIAGNOSIS — J329 Chronic sinusitis, unspecified: Secondary | ICD-10-CM | POA: Diagnosis not present

## 2017-08-21 DIAGNOSIS — R059 Cough, unspecified: Secondary | ICD-10-CM

## 2017-08-21 DIAGNOSIS — R05 Cough: Secondary | ICD-10-CM | POA: Diagnosis not present

## 2017-08-21 DIAGNOSIS — I2510 Atherosclerotic heart disease of native coronary artery without angina pectoris: Secondary | ICD-10-CM | POA: Diagnosis not present

## 2017-08-21 DIAGNOSIS — R053 Chronic cough: Secondary | ICD-10-CM

## 2017-08-21 HISTORY — DX: Atherosclerotic heart disease of native coronary artery without angina pectoris: I25.10

## 2017-08-21 LAB — LIPID PANEL
CHOL/HDL RATIO: 3.4 ratio (ref 0.0–5.0)
Cholesterol, Total: 185 mg/dL (ref 100–199)
HDL: 54 mg/dL (ref >39)
LDL Calculated: 115 mg/dL — ABNORMAL HIGH (ref 0–99)
TRIGLYCERIDES: 80 mg/dL (ref 0–149)
VLDL Cholesterol Cal: 16 mg/dL (ref 5–40)

## 2017-08-21 LAB — HEPATIC FUNCTION PANEL
ALBUMIN: 4.6 g/dL (ref 3.6–4.8)
ALK PHOS: 62 IU/L (ref 39–117)
ALT: 36 IU/L (ref 0–44)
AST: 22 IU/L (ref 0–40)
Bilirubin Total: 0.4 mg/dL (ref 0.0–1.2)
Bilirubin, Direct: 0.12 mg/dL (ref 0.00–0.40)
Total Protein: 7 g/dL (ref 6.0–8.5)

## 2017-08-21 MED ORDER — AMOXICILLIN-POT CLAVULANATE 875-125 MG PO TABS: 1.0000 | ORAL_TABLET | Freq: Two times a day (BID) | ORAL | 0 refills | Status: AC

## 2017-08-21 NOTE — Patient Instructions (Signed)
Medication Instructions:  Your physician recommends that you continue on your current medications as directed. Please refer to the Current Medication list given to you today.  If you need a refill on your cardiac medications, please contact your pharmacy first.  Labwork: Today for liver function test and fasting lipids  Testing/Procedures: Your physician has requested that you have en exercise stress myoview. For further information please visit [email or web address]. Please follow instruction sheet, as given.   Follow-Up: Your physician wants you to follow-up in: 1 year with Dr. Radford Pax. You will receive a reminder letter in the mail two months in advance. If you don't receive a letter, please call our office to schedule the follow-up appointment.  Any Other Special Instructions Will Be Listed Below (If Applicable).   Thank you for choosing [facility], RN  [phone]  If you need a refill on your cardiac medications before your next appointment, please call your pharmacy.

## 2017-08-21 NOTE — Progress Notes (Signed)
Cardiology Office Note    Date:  08/21/2017   ID:  Caleb Smith Dec 26, 1950, MRN [id number]  PCP:  Ria Bush, MD  Cardiologist:  Fransico Him, MD   Chief Complaint  Patient presents with  . New Patient (Initial Visit)    coronary artery calcifications    History of Present Illness:  Caleb Smith is a 67 y.o. male who is being seen today for the evaluation of coronary artery calcifications at the request of Ria Bush, MD.  Caleb Smith is a 67 y.o. male with a hx of GERD, HLD and recent chest CT showing coronary artery calcifications.  He denies any chest pain or pressure, SOB, DOE (escept with extreme exertion), PND, orthopnea, dizziness, palpitations or syncope. He denies any claudication.  He occasionally has some mild LE edema.  He is compliant with her meds and is tolerating meds with no SE.    Past Medical History:  Diagnosis Date  . Coronary artery calcification seen on CAT scan 08/21/2017  . GERD (gastroesophageal reflux disease) 1995   s/p esoph dilation for stricture  . HLD (hyperlipidemia)   . Hypothyroidism   . Obesity 07/14/2014    Past Surgical History:  Procedure Laterality Date  . COLONOSCOPY  03/2003   small sigmoid polyp, rpt 5 yrs [name])  . ESOPHAGOGASTRODUODENOSCOPY  1995   s/p esophageal dilation  . FACIAL RECONSTRUCTION SURGERY  1990s   cow headbutted him  . FINGER SURGERY Left teenager   reattachment of 2nd,3rd,4th    Current Medications: Current Meds  Medication Sig  . aspirin 81 MG chewable tablet Chew 81 mg by mouth daily.  Marland Kitchen atorvastatin (LIPITOR) 20 MG tablet TAKE 1 TABLET BY MOUTH DAILY AT 6 IN THEEVENING  . cholecalciferol (VITAMIN D) 1000 units tablet Take 1,000 Units by mouth daily.  Marland Kitchen omeprazole (PRILOSEC) 40 MG capsule TAKE ONE CAPSULE BY MOUTH DAILY  . SYNTHROID 100 MCG tablet TAKE 1 TABLET BY MOUTH DAILY BEFORE BREAKFAST  . vitamin B-12 (CYANOCOBALAMIN) 500 MCG tablet Take 500 mcg by  mouth daily.    Allergies:   Patient has no known allergies.   Social History   Socioeconomic History  . Marital status: Married    Spouse name: None  . Number of children: None  . Years of education: None  . Highest education level: None  Social Needs  . Financial resource strain: None  . Food insecurity - worry: None  . Food insecurity - inability: None  . Transportation needs - medical: None  . Transportation needs - non-medical: None  Occupational History  . None  Tobacco Use  . Smoking status: Never Smoker  . Smokeless tobacco: Former Systems developer    Types: Chew  Substance and Sexual Activity  . Alcohol use: Yes    Alcohol/week: 0.0 oz    Comment: 2-3 beer daily  . Drug use: No  . Sexual activity: None  Other Topics Concern  . None  Social History Narrative   Lives with wife   Grown children   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[employer]   Edu: [employer]   Activity: active on farm   Diet: good water, fruits/vegetables daily     Family History:  The patient's family history includes CAD (age of onset: 41) in his maternal uncle; Cancer in his mother; Suicidality in his father.   ROS:   Please see the history of present illness.    Review of Systems  HENT: Positive for hearing loss.  Respiratory: Positive for cough.    All other systems reviewed and are negative.  PAD Screen 08/21/2017  Previous PAD dx? No  Previous surgical procedure? No  Pain with walking? No  Feet/toe relief with dangling? No  Painful, non-healing ulcers? No  Extremities discolored? No       PHYSICAL EXAM:   VS:  BP 132/76   Pulse (!) 51   Ht 5\' 6"  (1.676 m)   Wt 198 lb 6.4 oz (90 kg)   BMI 32.02 kg/m    GEN: Well nourished, well developed, in no acute distress  HEENT: normal  Neck: no JVD, carotid bruits, or masses Cardiac: RRR; no murmurs, rubs, or gallops,no edema.  Intact distal pulses bilaterally.  Respiratory:  clear to auscultation bilaterally, normal work of breathing GI:  soft, nontender, nondistended, + BS MS: no deformity or atrophy  Skin: warm and dry, no rash Neuro:  Alert and Oriented x 3, Strength and sensation are intact Psych: euthymic mood, full affect  Wt Readings from Last 3 Encounters:  08/21/17 198 lb 6.4 oz (90 kg)  07/24/17 201 lb 3.2 oz (91.3 kg)  07/03/17 200 lb (90.7 kg)      Studies/Labs Reviewed:   EKG:  EKG is ordered today.  The ekg ordered today demonstrates sinus bradycardia at 51bpm with no ST changes  Recent Labs: 10/31/2016: ALT 32; BUN 15; Creatinine, Ser 1.31; Potassium 4.4; Sodium 139; TSH 2.10 07/24/2017: Hemoglobin 15.7; Platelets 246.0   Lipid Panel    Component Value Date/Time   CHOL 182 10/31/2016 0834   CHOL 204 07/17/2014   TRIG 94.0 10/31/2016 0834   TRIG 58 07/17/2014   HDL 61.10 10/31/2016 0834   CHOLHDL 3 10/31/2016 0834   VLDL 18.8 10/31/2016 0834   LDLCALC 102 (H) 10/31/2016 0834   LDLCALC 133 07/17/2014    Additional studies/ records that were reviewed today include:  none    ASSESSMENT:    1. Coronary artery calcification seen on CAT scan   2. Pure hypercholesterolemia      PLAN:  In order of problems listed above:  1.  Coronary artery calcifications noted on chest CT on 05/2017.  This showed aortic atherosclerosis as well as three-vessel coronary artery calcifications as well as left main disease.  The patient is completely asymptomatic and denies any chest pain, shortness of breath, PND or orthopnea.  I will get a baseline stress Myoview to rule out ischemia.  He will continue ASA and statin.   2.  Hyperlipidemia-LDL should be less than 70 given his underlying coronary artery calcifications.  His last LDL was a year ago and was 102.  I will repeat a fasting lipid panel.  He will continue on atorvastatin 20 mg daily for now.   Medication Adjustments/Labs and Tests Ordered: Current medicines are reviewed at length with the patient today.  Concerns regarding medicines are outlined above.   Medication changes, Labs and Tests ordered today are listed in the Patient Instructions below.  There are no Patient Instructions on file for this visit.   Signed, Fransico Him, MD  08/21/2017 9:42 AM    [facility] [place], [place], [place]  [address] Phone: [phone]; Fax: [phone]

## 2017-08-21 NOTE — Progress Notes (Deleted)
Cardiology Office Note:    Date:  08/21/2017   ID:  Kewan, Mcnease 09/30/1950, MRN [id number]  PCP:  Ria Bush, MD  Cardiologist:  No primary care provider on file.    Referring MD: Ria Bush, MD   No chief complaint on file.   History of Present Illness:    Caleb Smith is a 67 y.o. male with a hx of ***  Past Medical History:  Diagnosis Date  . GERD (gastroesophageal reflux disease) 1995   s/p esoph dilation for stricture  . HLD (hyperlipidemia)   . Hypothyroidism   . Obesity 07/14/2014    Past Surgical History:  Procedure Laterality Date  . COLONOSCOPY  03/2003   small sigmoid polyp, rpt 5 yrs [name])  . ESOPHAGOGASTRODUODENOSCOPY  1995   s/p esophageal dilation  . FACIAL RECONSTRUCTION SURGERY  1990s   cow headbutted him  . FINGER SURGERY Left teenager   reattachment of 2nd,3rd,4th    Current Medications: No outpatient medications have been marked as taking for the 08/21/17 encounter (Appointment) with Sueanne Margarita, MD.     Allergies:   Patient has no known allergies.   Social History   Socioeconomic History  . Marital status: Married    Spouse name: Not on file  . Number of children: Not on file  . Years of education: Not on file  . Highest education level: Not on file  Social Needs  . Financial resource strain: Not on file  . Food insecurity - worry: Not on file  . Food insecurity - inability: Not on file  . Transportation needs - medical: Not on file  . Transportation needs - non-medical: Not on file  Occupational History  . Not on file  Tobacco Use  . Smoking status: Never Smoker  . Smokeless tobacco: Former Systems developer    Types: Chew  Substance and Sexual Activity  . Alcohol use: Yes    Alcohol/week: 0.0 oz    Comment: 2-3 beer daily  . Drug use: No  . Sexual activity: Not on file  Other Topics Concern  . Not on file  Social History Narrative   Lives with wife   Grown children   Occupation: Gen  foreman/lineman at [employer]   Edu: [employer]   Activity: active on farm   Diet: good water, fruits/vegetables daily     Family History: The patient's ***family history includes CAD (age of onset: 66) in his maternal uncle; Cancer in his mother; Suicidality in his father. There is no history of Stroke, Diabetes, or Hypertension.  ROS:   Please see the history of present illness.    ROS  All other systems reviewed and negative.   EKGs/Labs/Other Studies Reviewed:    The following studies were reviewed today: ***  EKG:  EKG is *** ordered today.  The ekg ordered today demonstrates ***  Recent Labs: 10/31/2016: ALT 32; BUN 15; Creatinine, Ser 1.31; Potassium 4.4; Sodium 139; TSH 2.10 07/24/2017: Hemoglobin 15.7; Platelets 246.0   Recent Lipid Panel    Component Value Date/Time   CHOL 182 10/31/2016 0834   CHOL 204 07/17/2014   TRIG 94.0 10/31/2016 0834   TRIG 58 07/17/2014   HDL 61.10 10/31/2016 0834   CHOLHDL 3 10/31/2016 0834   VLDL 18.8 10/31/2016 0834   LDLCALC 102 (H) 10/31/2016 0834   LDLCALC 133 07/17/2014    Physical Exam:    VS:  There were no vitals taken for this visit.    Wt Readings from Last  3 Encounters:  07/24/17 201 lb 3.2 oz (91.3 kg)  07/03/17 200 lb (90.7 kg)  06/26/17 198 lb 2 oz (89.9 kg)     GEN: *** Well nourished, well developed in no acute distress HEENT: Normal NECK: No JVD; No carotid bruits LYMPHATICS: No lymphadenopathy CARDIAC: ***RRR, no murmurs, rubs, gallops RESPIRATORY:  Clear to auscultation without rales, wheezing or rhonchi  ABDOMEN: Soft, non-tender, non-distended MUSCULOSKELETAL:  No edema; No deformity  SKIN: Warm and dry NEUROLOGIC:  Alert and oriented x 3 PSYCHIATRIC:  Normal affect   ASSESSMENT:    No diagnosis found. PLAN:    In order of problems listed above:  ***   Medication Adjustments/Labs and Tests Ordered: Current medicines are reviewed at length with the patient today.  Concerns regarding medicines are  outlined above.  No orders of the defined types were placed in this encounter.  No orders of the defined types were placed in this encounter.   Signed, Fransico Him, MD  08/21/2017 8:46 AM    [place]

## 2017-08-24 ENCOUNTER — Telehealth: Payer: Self-pay

## 2017-08-24 DIAGNOSIS — E7841 Elevated Lipoprotein(a): Secondary | ICD-10-CM

## 2017-08-24 MED ORDER — ATORVASTATIN CALCIUM 40 MG PO TABS: 40.0000 mg | ORAL_TABLET | Freq: Every day | ORAL | 0 refills | Status: DC

## 2017-08-24 NOTE — Telephone Encounter (Signed)
Notes recorded by Teressa Senter, RN on 08/24/2017 at 1:07 PM EST Patient made aware of lab results and Dr. Theodosia Blender recommendation to INCREASE Lipitor to 40 mg once a day. Pt in agreement with plan, scheduled for repeat labs on 4/15. Patient verbalized understanding and thankful for the call   Notes recorded by Sueanne Margarita, MD on 08/23/2017 at 1:32 PM EST LDL not at goal - increase atorvastatin to 40mg  daily and repeat FLP and ALT in 6 weeks

## 2017-08-25 ENCOUNTER — Telehealth (HOSPITAL_COMMUNITY): Payer: Self-pay | Admitting: *Deleted

## 2017-08-25 NOTE — Telephone Encounter (Signed)
Left message on voicemail per DPR in reference to upcoming appointment scheduled on 08/28/17 with detailed instructions given per Myocardial Perfusion Study Information Sheet for the test. LM to arrive 15 minutes early, and that it is imperative to arrive on time for appointment to keep from having the test rescheduled. If you need to cancel or reschedule your appointment, please call the office within 24 hours of your appointment. Failure to do so may result in a cancellation of your appointment, and a $50 no show fee. Phone number given for call back for any questions. Kirstie Peri

## 2017-08-28 ENCOUNTER — Ambulatory Visit (HOSPITAL_COMMUNITY): Payer: BLUE CROSS/BLUE SHIELD | Attending: Cardiology

## 2017-08-28 DIAGNOSIS — I2510 Atherosclerotic heart disease of native coronary artery without angina pectoris: Secondary | ICD-10-CM | POA: Diagnosis not present

## 2017-08-28 LAB — MYOCARDIAL PERFUSION IMAGING
CHL CUP NUCLEAR SDS: 0
CHL CUP RESTING HR STRESS: 58 {beats}/min
CSEPED: 7 min
CSEPEW: 8.9 METS
Exercise duration (sec): 15 s
LHR: 0.36
LV dias vol: 82 mL (ref 62–150)
LV sys vol: 32 mL
MPHR: 155 {beats}/min
Peak HR: 150 {beats}/min
Percent HR: 96 %
SRS: 0
SSS: 0
TID: 0.88

## 2017-08-28 MED ORDER — TECHNETIUM TC 99M TETROFOSMIN IV KIT
10.3000 | PACK | Freq: Once | INTRAVENOUS | Status: AC | PRN
  Administered 2017-08-28: 10.3 via INTRAVENOUS
  Filled 2017-08-28: qty 11

## 2017-08-28 MED ORDER — TECHNETIUM TC 99M TETROFOSMIN IV KIT
32.4000 | PACK | Freq: Once | INTRAVENOUS | Status: AC | PRN
  Administered 2017-08-28: 32.4 via INTRAVENOUS
  Filled 2017-08-28: qty 33

## 2017-08-29 ENCOUNTER — Encounter: Payer: Self-pay | Admitting: Family Medicine

## 2017-08-30 ENCOUNTER — Other Ambulatory Visit: Payer: Self-pay | Admitting: Cardiology

## 2017-09-11 ENCOUNTER — Other Ambulatory Visit: Payer: Self-pay | Admitting: Family Medicine

## 2017-10-02 ENCOUNTER — Encounter: Payer: Self-pay | Admitting: Internal Medicine

## 2017-10-02 ENCOUNTER — Ambulatory Visit: Payer: BLUE CROSS/BLUE SHIELD | Admitting: Internal Medicine

## 2017-10-02 VITALS — BP 138/70 | HR 73 | Ht 66.0 in | Wt 202.0 lb

## 2017-10-02 DIAGNOSIS — R05 Cough: Secondary | ICD-10-CM | POA: Diagnosis not present

## 2017-10-02 DIAGNOSIS — R911 Solitary pulmonary nodule: Secondary | ICD-10-CM

## 2017-10-02 DIAGNOSIS — R058 Other specified cough: Secondary | ICD-10-CM

## 2017-10-02 NOTE — Progress Notes (Signed)
Subjective:    Patient ID: Caleb Smith, male    DOB: September 21, 1950,     MRN: [id number]    Brief patient profile:  27 yowm never smoker only passive exp from father  with new onset cough since 2016 esp first thing in am and sporadic the rest of the day referred to pulmonary clinic 07/24/2017 by Dr   Danise Mina with abn ct    History of Present Illness  07/24/2017 [facility] office visit/ Caleb Smith   Chief Complaint  Patient presents with  . Pulmonary Consult    referred by Dr. Armanda Magic, cough, nodule on right lung  minimally productive cough x 2 years worse in winter occ assoc with watery rhinitis  Acutely ill around xmas 2018 with hoarseness/ severe cough min productive slt yellow rx cough med/ nasal sprays/inhaler zpak  no better. Has been on omeprazole x > decade s/p stretching x sev times by [employer] last (has been retired x ? A decade prior to OV  ) No assoc wheezing or sob/ Not limited by breathing from desired activities      -  Allergy profile 07/24/2017 >  Eos 0.2 /  IgE  67 RAST pos trees, grass, ragweed  - Sinus ct 08/21/2017 >>> Mild bilateral maxillary sinus mucosal thickening, likely with a small amount of fluid on the left which may reflect acute on chronic Sinusitis >>> rec augmentin bid x 10 days then repeat ct if symptoms not better > declined    10/02/2017  f/u ov/Jehan Bonano re:  uacs x > 2 y Chief Complaint  Patient presents with  . Follow-up    Cough has improved some, but has not resolved. He occ will produce some clear sputum. Cough is worse first thing in the morning, and he has also had some runny nose in the am's.    Dyspnea:  Not limited by breathing from desired activities   Cough:  First thing in am p stirs 4/7 days cough x 30 min Sleep: never disturbs sleep SABA use:  None  Does report symptoms worse when starts mowing grass but never tried otc antihistamines or singulair / nasal steroids to his knowledge and "not interested in taking any more  pills"   No obvious day to day or daytime variability or assoc excess/ purulent sputum or mucus plugs or hemoptysis or cp or chest tightness, subjective wheeze or overt sinus or hb symptoms. No unusual exposure hx or h/o childhood pna/ asthma or knowledge of premature birth.  Sleeping ok flat without nocturnal  or early am exacerbation  of respiratory  c/o's or need for noct saba. Also denies any obvious fluctuation of symptoms with weather or environmental changes or other aggravating or alleviating factors except as outlined above   Current Allergies, Complete Past Medical History, Past Surgical History, Family History, and Social History were reviewed in [employer] record.  ROS  The following are not active complaints unless bolded Hoarseness, sore throat, dysphagia, dental problems, itching, sneezing,  nasal congestion or discharge of excess mucus or purulent secretions, ear ache,   fever, chills, sweats, unintended wt loss or wt gain, classically pleuritic or exertional cp,  orthopnea pnd or leg swelling, presyncope, palpitations, abdominal pain, anorexia, nausea, vomiting, diarrhea  or change in bowel habits or change in bladder habits, change in stools or change in urine, dysuria, hematuria,  rash, arthralgias, visual complaints, headache, numbness, weakness or ataxia or problems with walking or coordination,  change in mood/affect or memory.  Current Meds  Medication Sig  . aspirin 81 MG chewable tablet Chew 81 mg by mouth daily.  Marland Kitchen atorvastatin (LIPITOR) 40 MG tablet TAKE 1 TABLET BY MOUTH EVERY DAY  . cholecalciferol (VITAMIN D) 1000 units tablet Take 1,000 Units by mouth daily.  Marland Kitchen omeprazole (PRILOSEC) 40 MG capsule TAKE ONE CAPSULE BY MOUTH DAILY  . SYNTHROID 100 MCG tablet TAKE 1 TABLET BY MOUTH DAILY BEFORE BREAKFAST  . vitamin B-12 (CYANOCOBALAMIN) 500 MCG tablet Take 500 mcg by mouth daily.                      Objective:   Physical  Exam    amb wm nad    10/02/2017        202  07/24/17 201 lb 3.2 oz (91.3 kg)  07/03/17 200 lb (90.7 kg)  06/26/17 198 lb 2 oz (89.9 kg)      Vital signs reviewed - Note on arrival 02 sats  97% on RA        HEENT:    Oropharynx with min cobblestoning/ no excess PND.  Nl external ear canals without cough reflex -  midl to moderate bilateral non-specific turbinate edema  - no purulent secrtions/ partial dentures   NECK :  without JVD/Nodes/TM/ nl carotid upstrokes bilaterally   LUNGS: no acc muscle use,  Nl contour chest which is clear to A and P bilaterally without cough on insp or exp maneuvers   CV:  RRR  no s3 or murmur or increase in P2, and no edema   ABD:  soft and nontender with nl inspiratory excursion in the supine position. No bruits or organomegaly appreciated, bowel sounds nl  MS:  Nl gait/ ext warm without deformities, calf tenderness, cyanosis or clubbing No obvious joint restrictions   SKIN: warm and dry without lesions    NEURO:  alert, approp, nl sensorium with  no motor or cerebellar deficits apparent.                 Assessment & Plan:

## 2017-10-02 NOTE — Assessment & Plan Note (Signed)
06/26/2017 5 mm right middle lobe lung nodule> in computer for recall 06/26/18   Discussed in detail all the  indications, usual  risks and alternatives  relative to the benefits with patient who is not inclined to pursue this further but will decide for good in Dec 2019 as already in the reminder file for recall

## 2017-10-02 NOTE — Patient Instructions (Signed)
Next step in the work up would be to start Singulair 10 mg daily thru the spring season   Take  prilosec 30 min before supper   For drainage / throat tickle try take CHLORPHENIRAMINE  4 mg - take one every 4 hours as needed - available over the counter- may cause drowsiness so start with just a bedtime dose or two and see how you tolerate it before trying in daytime      If you are satisfied with your treatment plan,  let your doctor know and he/she can either refill your medications or you can return here when your prescription runs out.     If in any way you are not 100% satisfied,  please tell us.  If 100% better, tell your friends!  Pulmonary follow up is as needed

## 2017-10-02 NOTE — Assessment & Plan Note (Signed)
Onset xmas 2016 with assoc watery rhinitis - flared since xmas 2018  -  Allergy profile 07/24/2017 >  Eos 0.2 /  IgE  67 RAST pos trees, grass, ragweed  - Sinus ct 08/21/2017 >>> Mild bilateral maxillary sinus mucosal thickening, likely with a small amount of fluid on the left which may reflect acute on chronic Sinusitis >>> rec augmentin bid x 10 days then repeat ct if symptoms not better > declined  - 10/02/2017 rec trial of 1st gen H1 blockers per guidelines  And then singulair if not better > pulmonary f/u prn  Residual symptoms are "not bad enough to rx" per pt though does report worse when starts cutting grass so should at least try 1st gen Anti-H1 and then trial of singulair or see allergy next if still not satisfied.  Should also be taking ppi ac, not hs, for best effects.    Each maintenance medication was reviewed in detail including most importantly the difference between maintenance and as needed and under what circumstances the prns are to be used.  Please see AVS for specific  Instructions which are unique to this visit and I personally typed out  which were reviewed in detail in writing with the patient and a copy provided.

## 2017-10-12 ENCOUNTER — Other Ambulatory Visit: Payer: BLUE CROSS/BLUE SHIELD

## 2017-10-13 ENCOUNTER — Other Ambulatory Visit: Payer: BLUE CROSS/BLUE SHIELD | Admitting: *Deleted

## 2017-10-13 DIAGNOSIS — E7841 Elevated Lipoprotein(a): Secondary | ICD-10-CM | POA: Diagnosis not present

## 2017-10-13 LAB — LIPID PANEL
CHOL/HDL RATIO: 3.6 ratio (ref 0.0–5.0)
Cholesterol, Total: 182 mg/dL (ref 100–199)
HDL: 51 mg/dL (ref >39)
LDL Calculated: 107 mg/dL — ABNORMAL HIGH (ref 0–99)
TRIGLYCERIDES: 118 mg/dL (ref 0–149)
VLDL Cholesterol Cal: 24 mg/dL (ref 5–40)

## 2017-10-13 LAB — ALT: ALT: 38 IU/L (ref 0–44)

## 2017-10-14 ENCOUNTER — Other Ambulatory Visit: Payer: Self-pay

## 2017-10-14 DIAGNOSIS — E785 Hyperlipidemia, unspecified: Secondary | ICD-10-CM

## 2017-10-14 NOTE — Progress Notes (Signed)
Repeat fasting lipids and liver function ordered per Dr.Turner.

## 2017-10-15 ENCOUNTER — Telehealth: Payer: Self-pay

## 2017-10-15 MED ORDER — ATORVASTATIN CALCIUM 80 MG PO TABS: 80.0000 mg | ORAL_TABLET | Freq: Every day | ORAL | 11 refills | Status: DC

## 2017-10-15 NOTE — Telephone Encounter (Signed)
Patient instructed to increase Lipitor to 80 mg once a day and scheduled for repeat labs on 12/09/17. Patient verbalized understanding and thankful for the call.   Notes recorded by Sueanne Margarita, MD on 10/15/2017 at 8:35 AM EDT Yes increase Lipitor to 80mg  daily and repeat FLP and ALT in 6 weeks ------  Notes recorded by Teressa Senter, RN on 10/14/2017 at 3:27 PM EDT Patient is currently taking Lipitor 40mg , would you like to increase to 80 mg once a day?

## 2017-10-23 ENCOUNTER — Other Ambulatory Visit: Payer: Self-pay | Admitting: Family Medicine

## 2017-11-01 ENCOUNTER — Other Ambulatory Visit: Payer: Self-pay | Admitting: Family Medicine

## 2017-11-01 DIAGNOSIS — Z125 Encounter for screening for malignant neoplasm of prostate: Secondary | ICD-10-CM

## 2017-11-01 DIAGNOSIS — E039 Hypothyroidism, unspecified: Secondary | ICD-10-CM

## 2017-11-01 DIAGNOSIS — E7800 Pure hypercholesterolemia, unspecified: Secondary | ICD-10-CM

## 2017-11-04 ENCOUNTER — Other Ambulatory Visit (INDEPENDENT_AMBULATORY_CARE_PROVIDER_SITE_OTHER): Payer: BLUE CROSS/BLUE SHIELD

## 2017-11-04 DIAGNOSIS — E039 Hypothyroidism, unspecified: Secondary | ICD-10-CM | POA: Diagnosis not present

## 2017-11-04 DIAGNOSIS — Z125 Encounter for screening for malignant neoplasm of prostate: Secondary | ICD-10-CM | POA: Diagnosis not present

## 2017-11-04 DIAGNOSIS — E7800 Pure hypercholesterolemia, unspecified: Secondary | ICD-10-CM

## 2017-11-04 LAB — COMPREHENSIVE METABOLIC PANEL
ALBUMIN: 4.1 g/dL (ref 3.5–5.2)
ALT: 31 U/L (ref 0–53)
AST: 18 U/L (ref 0–37)
Alkaline Phosphatase: 52 U/L (ref 39–117)
BUN: 14 mg/dL (ref 6–23)
CHLORIDE: 104 meq/L (ref 96–112)
CO2: 28 mEq/L (ref 19–32)
Calcium: 9.2 mg/dL (ref 8.4–10.5)
Creatinine, Ser: 1.23 mg/dL (ref 0.40–1.50)
GFR: 62.58 mL/min (ref >60.00)
Glucose, Bld: 95 mg/dL (ref 70–99)
POTASSIUM: 4.7 meq/L (ref 3.5–5.1)
Sodium: 141 mEq/L (ref 135–145)
TOTAL PROTEIN: 6.5 g/dL (ref 6.0–8.3)
Total Bilirubin: 0.5 mg/dL (ref 0.2–1.2)

## 2017-11-04 LAB — PSA: PSA: 1.22 ng/mL (ref 0.10–4.00)

## 2017-11-04 LAB — TSH: TSH: 2.57 u[IU]/mL (ref 0.35–4.50)

## 2017-11-06 ENCOUNTER — Encounter: Payer: BLUE CROSS/BLUE SHIELD | Admitting: Family Medicine

## 2017-11-13 ENCOUNTER — Ambulatory Visit (INDEPENDENT_AMBULATORY_CARE_PROVIDER_SITE_OTHER): Payer: BLUE CROSS/BLUE SHIELD | Admitting: Family Medicine

## 2017-11-13 ENCOUNTER — Encounter: Payer: Self-pay | Admitting: Family Medicine

## 2017-11-13 VITALS — BP 120/68 | HR 69 | Temp 97.9°F | Ht 66.75 in | Wt 202.2 lb

## 2017-11-13 DIAGNOSIS — E669 Obesity, unspecified: Secondary | ICD-10-CM | POA: Diagnosis not present

## 2017-11-13 DIAGNOSIS — E039 Hypothyroidism, unspecified: Secondary | ICD-10-CM | POA: Diagnosis not present

## 2017-11-13 DIAGNOSIS — Z0000 Encounter for general adult medical examination without abnormal findings: Secondary | ICD-10-CM | POA: Diagnosis not present

## 2017-11-13 DIAGNOSIS — Z1211 Encounter for screening for malignant neoplasm of colon: Secondary | ICD-10-CM | POA: Diagnosis not present

## 2017-11-13 DIAGNOSIS — E7800 Pure hypercholesterolemia, unspecified: Secondary | ICD-10-CM | POA: Diagnosis not present

## 2017-11-13 DIAGNOSIS — K219 Gastro-esophageal reflux disease without esophagitis: Secondary | ICD-10-CM | POA: Diagnosis not present

## 2017-11-13 DIAGNOSIS — R05 Cough: Secondary | ICD-10-CM | POA: Diagnosis not present

## 2017-11-13 DIAGNOSIS — I700 Atherosclerosis of aorta: Secondary | ICD-10-CM | POA: Diagnosis not present

## 2017-11-13 DIAGNOSIS — R058 Other specified cough: Secondary | ICD-10-CM

## 2017-11-13 DIAGNOSIS — Z7189 Other specified counseling: Secondary | ICD-10-CM | POA: Diagnosis not present

## 2017-11-13 DIAGNOSIS — I2510 Atherosclerotic heart disease of native coronary artery without angina pectoris: Secondary | ICD-10-CM | POA: Diagnosis not present

## 2017-11-13 MED ORDER — ATORVASTATIN CALCIUM 80 MG PO TABS: 80.0000 mg | ORAL_TABLET | Freq: Every day | ORAL | 11 refills | Status: DC

## 2017-11-13 MED ORDER — OMEPRAZOLE 40 MG PO CPDR: 40.0000 mg | DELAYED_RELEASE_CAPSULE | Freq: Every day | ORAL | 3 refills | Status: DC

## 2017-11-13 MED ORDER — SYNTHROID 100 MCG PO TABS: 100.0000 ug | ORAL_TABLET | Freq: Every day | ORAL | 3 refills | Status: DC

## 2017-11-13 NOTE — Assessment & Plan Note (Signed)
Chronic, stable. Continue PPI daily - breakthrough sxs if missed dose.

## 2017-11-13 NOTE — Assessment & Plan Note (Signed)
Advanced directive - does not have set up. Would want wife then daughters to be HCPOA. Packet provided today. 

## 2017-11-13 NOTE — Patient Instructions (Addendum)
Pass by lab to pick up stool kit, drop it off at our lab when you return it.  You are doing well today Continue current medicine regimen. Return as needed or in 1 year for next physical. Advanced directive packet provided today  Health Maintenance, Male A healthy lifestyle and preventive care is important for your health and wellness. Ask your health care provider about what schedule of regular examinations is right for you. What should I know about weight and diet? Eat a Healthy Diet  Eat plenty of vegetables, fruits, whole grains, low-fat dairy products, and lean protein.  Do not eat a lot of foods high in solid fats, added sugars, or salt.  Maintain a Healthy Weight Regular exercise can help you achieve or maintain a healthy weight. You should:  Do at least 150 minutes of exercise each week. The exercise should increase your heart rate and make you sweat (moderate-intensity exercise).  Do strength-training exercises at least twice a week.  Watch Your Levels of Cholesterol and Blood Lipids  Have your blood tested for lipids and cholesterol every 5 years starting at 66 years of age. If you are at high risk for heart disease, you should start having your blood tested when you are 66 years old. You may need to have your cholesterol levels checked more often if: ? Your lipid or cholesterol levels are high. ? You are older than 66 years of age. ? You are at high risk for heart disease.  What should I know about cancer screening? Many types of cancers can be detected early and may often be prevented. Lung Cancer  You should be screened every year for lung cancer if: ? You are a current smoker who has smoked for at least 30 years. ? You are a former smoker who has quit within the past 15 years.  Talk to your health care provider about your screening options, when you should start screening, and how often you should be screened.  Colorectal Cancer  Routine colorectal cancer  screening usually begins at 66 years of age and should be repeated every 5-10 years until you are 66 years old. You may need to be screened more often if early forms of precancerous polyps or small growths are found. Your health care provider may recommend screening at an earlier age if you have risk factors for colon cancer.  Your health care provider may recommend using home test kits to check for hidden blood in the stool.  A small camera at the end of a tube can be used to examine your colon (sigmoidoscopy or colonoscopy). This checks for the earliest forms of colorectal cancer.  Prostate and Testicular Cancer  Depending on your age and overall health, your health care provider may do certain tests to screen for prostate and testicular cancer.  Talk to your health care provider about any symptoms or concerns you have about testicular or prostate cancer.  Skin Cancer  Check your skin from head to toe regularly.  Tell your health care provider about any new moles or changes in moles, especially if: ? There is a change in a mole's size, shape, or color. ? You have a mole that is larger than a pencil eraser.  Always use sunscreen. Apply sunscreen liberally and repeat throughout the day.  Protect yourself by wearing long sleeves, pants, a wide-brimmed hat, and sunglasses when outside.  What should I know about heart disease, diabetes, and high blood pressure?  If you are 18-39 years of  age, have your blood pressure checked every 3-5 years. If you are 40 years of age or older, have your blood pressure checked every year. You should have your blood pressure measured twice-once when you are at a hospital or clinic, and once when you are not at a hospital or clinic. Record the average of the two measurements. To check your blood pressure when you are not at a hospital or clinic, you can use: ? An automated blood pressure machine at a pharmacy. ? A home blood pressure monitor.  Talk to your  health care provider about your target blood pressure.  If you are between 45-79 years old, ask your health care provider if you should take aspirin to prevent heart disease.  Have regular diabetes screenings by checking your fasting blood sugar level. ? If you are at a normal weight and have a low risk for diabetes, have this test once every three years after the age of 45. ? If you are overweight and have a high risk for diabetes, consider being tested at a younger age or more often.  A one-time screening for abdominal aortic aneurysm (AAA) by ultrasound is recommended for men aged 65-75 years who are current or former smokers. What should I know about preventing infection? Hepatitis B If you have a higher risk for hepatitis B, you should be screened for this virus. Talk with your health care provider to find out if you are at risk for hepatitis B infection. Hepatitis C Blood testing is recommended for:  Everyone born from 1945 through 1965.  Anyone with known risk factors for hepatitis C.  Sexually Transmitted Diseases (STDs)  You should be screened each year for STDs including gonorrhea and chlamydia if: ? You are sexually active and are younger than 66 years of age. ? You are older than 66 years of age and your health care provider tells you that you are at risk for this type of infection. ? Your sexual activity has changed since you were last screened and you are at an increased risk for chlamydia or gonorrhea. Ask your health care provider if you are at risk.  Talk with your health care provider about whether you are at high risk of being infected with HIV. Your health care provider may recommend a prescription medicine to help prevent HIV infection.  What else can I do?  Schedule regular health, dental, and eye exams.  Stay current with your vaccines (immunizations).  Do not use any tobacco products, such as cigarettes, chewing tobacco, and e-cigarettes. If you need help  quitting, ask your health care provider.  Limit alcohol intake to no more than 2 drinks per day. One drink equals 12 ounces of beer, 5 ounces of wine, or 1 ounces of hard liquor.  Do not use street drugs.  Do not share needles.  Ask your health care provider for help if you need support or information about quitting drugs.  Tell your health care provider if you often feel depressed.  Tell your health care provider if you have ever been abused or do not feel safe at home. This information is not intended to replace advice given to you by your health care provider. Make sure you discuss any questions you have with your health care provider. Document Released: 12/13/2007 Document Revised: 02/13/2016 Document Reviewed: 03/20/2015 Elsevier Interactive Patient Education  2018 [employer]  

## 2017-11-13 NOTE — Assessment & Plan Note (Addendum)
Preventative protocols reviewed and updated unless pt declined. Discussed healthy diet and lifestyle.  

## 2017-11-13 NOTE — Assessment & Plan Note (Signed)
Chronic, stable. Continue current regimen. 

## 2017-11-13 NOTE — Assessment & Plan Note (Signed)
Saw pulm, thought allergic related, didn't find singulair to be effective.

## 2017-11-13 NOTE — Progress Notes (Addendum)
BP 120/68 (BP Location: Left Arm, Patient Position: Sitting, Cuff Size: Normal)   Pulse 69   Temp 97.9 F (36.6 C) (Oral)   Ht 5' 6.75" (1.695 m)   Wt 202 lb 4 oz (91.7 kg)   SpO2 95%   BMI 31.91 kg/m    CC: CPE Subjective:    Patient ID: Caleb Smith, male    DOB: Nov 09, 1950, 67 y.o.   MRN: [id number]  HPI: Caleb Smith is a 67 y.o. male presenting on 11/13/2017 for Annual Exam (Wants to discuss atorvastatin to see if it can be decreased back to 71m.  Will need refill if so.)   HLD - lipitor recently increased to 862mdue to LDL levels above goal.  GERD - breakthrough symptoms if stops omeprazole. PPI QOD was not effective.  Chronic cough - saw pulm though allergic. singulair didn't help. takes chlorpheniramine PRN   Preventative: COLONOSCOPY Date: 03/2003 small sigmoid polyp, rpt 5 yrs (WSammuel Cooper Pt requests yearly stool kit (did not turn in last year).  Prostate cancer screening - discussed, DRE normal 2018. [id number].  Flu shot yearly Pneumovax 2013, prevnar 06/2017. Due for last pneumovax Td 2011 Zostavax 2013 Shingrix - discussed Advanced directive - does not have set up. Would want wife then daughters to be HCPOA. Packet provided today.  Seat belt use discussed.  Sunscreen use discussed - doesn't use. No changing moles on skin. Non smoker.  Alcohol - 4 beers/day, more on weekends Dentist - yearly Eye exam yearly  Lives with wife Grown children Occupation: Gen foreman/lineman  Edu: HS Activity: active on farm  Diet: good water, fruits/vegetables daily   Relevant past medical, surgical, family and social history reviewed and updated as indicated. Interim medical history since our last visit reviewed. Allergies and medications reviewed and updated. Outpatient Medications Prior to Visit  Medication Sig Dispense Refill  . aspirin 81 MG chewable tablet Chew 81 mg by mouth daily.    . cholecalciferol (VITAMIN D) 1000 units tablet Take 1,000 Units  by mouth daily.    . vitamin B-12 (CYANOCOBALAMIN) 500 MCG tablet Take 500 mcg by mouth daily.    . aMarland Kitchenorvastatin (LIPITOR) 80 MG tablet Take 1 tablet (80 mg total) by mouth daily. 30 tablet 11  . omeprazole (PRILOSEC) 40 MG capsule TAKE ONE CAPSULE BY MOUTH DAILY 90 capsule 3  . SYNTHROID 100 MCG tablet TAKE 1 TABLET BY MOUTH DAILY BEFORE BREAKFAST 90 tablet 0   No facility-administered medications prior to visit.      Per HPI unless specifically indicated in ROS section below Review of Systems  Constitutional: Negative for activity change, appetite change, chills, fatigue, fever and unexpected weight change.  HENT: Negative for hearing loss.   Eyes: Negative for visual disturbance.  Respiratory: Positive for cough (in mornings). Negative for chest tightness, shortness of breath and wheezing.   Cardiovascular: Negative for chest pain, palpitations and leg swelling.  Gastrointestinal: Negative for abdominal distention, abdominal pain, blood in stool, constipation, diarrhea, nausea and vomiting.  Genitourinary: Negative for difficulty urinating and hematuria.  Musculoskeletal: Negative for arthralgias, myalgias and neck pain.  Skin: Negative for rash.  Neurological: Negative for dizziness, seizures, syncope and headaches.  Hematological: Negative for adenopathy. Bruises/bleeds easily.  Psychiatric/Behavioral: Negative for dysphoric mood. The patient is not nervous/anxious.        Objective:    BP 120/68 (BP Location: Left Arm, Patient Position: Sitting, Cuff Size: Normal)   Pulse 69   Temp 97.9 F (36.6 C) (  Oral)   Ht 5' 6.75" (1.695 m)   Wt 202 lb 4 oz (91.7 kg)   SpO2 95%   BMI 31.91 kg/m   Wt Readings from Last 3 Encounters:  11/13/17 202 lb 4 oz (91.7 kg)  10/02/17 202 lb (91.6 kg)  08/21/17 198 lb 6.4 oz (90 kg)    Physical Exam  Constitutional: He is oriented to person, place, and time. He appears well-developed and well-nourished. No distress.  HENT:  Head:  Normocephalic and atraumatic.  Right Ear: Hearing, tympanic membrane, external ear and ear canal normal.  Left Ear: Hearing, tympanic membrane, external ear and ear canal normal.  Nose: Nose normal.  Mouth/Throat: Uvula is midline, oropharynx is clear and moist and mucous membranes are normal. No oropharyngeal exudate, posterior oropharyngeal edema or posterior oropharyngeal erythema.  Eyes: Pupils are equal, round, and reactive to light. Conjunctivae and EOM are normal. No scleral icterus.  Neck: Normal range of motion. Neck supple. No thyromegaly present.  Cardiovascular: Normal rate, regular rhythm, normal heart sounds and intact distal pulses.  No murmur heard. Pulses:      Radial pulses are 2+ on the right side, and 2+ on the left side.  Pulmonary/Chest: Effort normal and breath sounds normal. No respiratory distress. He has no wheezes. He has no rales.  Abdominal: Soft. Bowel sounds are normal. He exhibits no distension and no mass. There is no tenderness. There is no rebound and no guarding.  Musculoskeletal: Normal range of motion. He exhibits no edema.  Lymphadenopathy:    He has no cervical adenopathy.  Neurological: He is alert and oriented to person, place, and time.  CN grossly intact, station and gait intact  Skin: Skin is warm and dry. No rash noted.  Psychiatric: He has a normal mood and affect. His behavior is normal. Judgment and thought content normal.  Nursing note and vitals reviewed.  Results for orders placed or performed in visit on 11/04/17  PSA  Result Value Ref Range   PSA 1.22 0.10 - 4.00 ng/mL  TSH  Result Value Ref Range   TSH 2.57 0.35 - 4.50 uIU/mL  Comprehensive metabolic panel  Result Value Ref Range   Sodium 141 135 - 145 mEq/L   Potassium 4.7 3.5 - 5.1 mEq/L   Chloride 104 96 - 112 mEq/L   CO2 28 19 - 32 mEq/L   Glucose, Bld 95 70 - 99 mg/dL   BUN 14 6 - 23 mg/dL   Creatinine, Ser 1.23 0.40 - 1.50 mg/dL   Total Bilirubin 0.5 0.2 - 1.2 mg/dL    Alkaline Phosphatase 52 39 - 117 U/L   AST 18 0 - 37 U/L   ALT 31 0 - 53 U/L   Total Protein 6.5 6.0 - 8.3 g/dL   Albumin 4.1 3.5 - 5.2 g/dL   Calcium 9.2 8.4 - 10.5 mg/dL   GFR 62.58 >60.00 mL/min      Assessment & Plan:   Problem List Items Addressed This Visit    Advanced care planning/counseling discussion    Advanced directive - does not have set up. Would want wife then daughters to be HCPOA. Packet provided today.       Atherosclerosis of aorta (HCC)    Continue aspirin, statin.       Relevant Medications   atorvastatin (LIPITOR) 80 MG tablet   Coronary artery calcification seen on CAT scan    Continue aspirin, statin.      Relevant Medications   atorvastatin (LIPITOR) 80  MG tablet   GERD (gastroesophageal reflux disease)    Chronic, stable. Continue PPI daily - breakthrough sxs if missed dose.       Relevant Medications   omeprazole (PRILOSEC) 40 MG capsule   Health maintenance examination - Primary    Preventative protocols reviewed and updated unless pt declined. Discussed healthy diet and lifestyle.       HLD (hyperlipidemia)    Chronic, stable and tolerating higher lipitor dose - conitnue. Will need FLP in next few months - this has been ordered by cardiology.  The 10-year ASCVD risk score Mikey Bussing DC Brooke Bonito., et al., 2013) is: 11.5%   Values used to calculate the score:     Age: 47 years     Sex: Male     Is Non-Hispanic African American: No     Diabetic: No     Tobacco smoker: No     Systolic Blood Pressure: 991 mmHg     Is BP treated: No     HDL Cholesterol: 51 mg/dL     Total Cholesterol: 182 mg/dL       Relevant Medications   atorvastatin (LIPITOR) 80 MG tablet   Hypothyroidism    Chronic, stable. Continue current regimen.       Relevant Medications   SYNTHROID 100 MCG tablet   Obesity, Class I, BMI 30-34.9   Upper airway cough syndrome    Saw pulm, thought allergic related, didn't find singulair to be effective.        Other Visit  Diagnoses    Special screening for malignant neoplasms, colon       Relevant Orders   Fecal occult blood, imunochemical       Meds ordered this encounter  Medications  . omeprazole (PRILOSEC) 40 MG capsule    Sig: Take 1 capsule (40 mg total) by mouth daily.    Dispense:  90 capsule    Refill:  3  . SYNTHROID 100 MCG tablet    Sig: Take 1 tablet (100 mcg total) by mouth daily before breakfast.    Dispense:  90 tablet    Refill:  3  . atorvastatin (LIPITOR) 80 MG tablet    Sig: Take 1 tablet (80 mg total) by mouth daily.    Dispense:  30 tablet    Refill:  11   Orders Placed This Encounter  Procedures  . Fecal occult blood, imunochemical    Standing Status:   Future    Standing Expiration Date:   11/14/2018    Follow up plan: Return in about 1 year (around 11/14/2018) for annual exam, prior fasting for blood work.  Ria Bush, MD

## 2017-11-13 NOTE — Assessment & Plan Note (Signed)
Continue aspirin, statin.  

## 2017-11-13 NOTE — Assessment & Plan Note (Addendum)
Chronic, stable and tolerating higher lipitor dose - conitnue. Will need FLP in next few months - this has been ordered by cardiology.  The 10-year ASCVD risk score Mikey Bussing DC Brooke Bonito., et al., 2013) is: 11.5%   Values used to calculate the score:     Age: 67 years     Sex: Male     Is Non-Hispanic African American: No     Diabetic: No     Tobacco smoker: No     Systolic Blood Pressure: 832 mmHg     Is BP treated: No     HDL Cholesterol: 51 mg/dL     Total Cholesterol: 182 mg/dL

## 2017-12-09 ENCOUNTER — Other Ambulatory Visit: Payer: BLUE CROSS/BLUE SHIELD | Admitting: *Deleted

## 2017-12-09 DIAGNOSIS — E785 Hyperlipidemia, unspecified: Secondary | ICD-10-CM | POA: Diagnosis not present

## 2017-12-09 LAB — LIPID PANEL
CHOLESTEROL TOTAL: 160 mg/dL (ref 100–199)
Chol/HDL Ratio: 3 ratio (ref 0.0–5.0)
HDL: 53 mg/dL (ref >39)
LDL CALC: 94 mg/dL (ref 0–99)
TRIGLYCERIDES: 63 mg/dL (ref 0–149)
VLDL CHOLESTEROL CAL: 13 mg/dL (ref 5–40)

## 2017-12-09 LAB — ALT: ALT: 40 IU/L (ref 0–44)

## 2017-12-10 ENCOUNTER — Telehealth: Payer: Self-pay

## 2017-12-10 DIAGNOSIS — E785 Hyperlipidemia, unspecified: Secondary | ICD-10-CM

## 2017-12-10 MED ORDER — EZETIMIBE 10 MG PO TABS: 10.0000 mg | ORAL_TABLET | Freq: Every day | ORAL | 3 refills | Status: DC

## 2017-12-10 NOTE — Telephone Encounter (Signed)
Notes recorded by Teressa Senter, RN on 12/10/2017 at 12:43 PM EDT Pt made aware of lab results. He instructed to start Zetia 10 mg daily target LDL <70 and repeat FLP and ALT in 6 weeks. He is in agreement with treatment plan and thankful for the call. He is scheduled for repeat labs on 02/05/18.   Notes recorded by Sueanne Margarita, MD on 12/09/2017 at 4:49 PM EDT LDL still not at goal - add zetia 10mg  daily and repeat FLP and ALT in 6 weeks

## 2018-02-05 ENCOUNTER — Other Ambulatory Visit: Payer: BLUE CROSS/BLUE SHIELD | Admitting: *Deleted

## 2018-02-05 DIAGNOSIS — E785 Hyperlipidemia, unspecified: Secondary | ICD-10-CM | POA: Diagnosis not present

## 2018-02-05 LAB — ALT: ALT: 32 IU/L (ref 0–44)

## 2018-02-05 LAB — LIPID PANEL
Chol/HDL Ratio: 2.3 ratio (ref 0.0–5.0)
Cholesterol, Total: 141 mg/dL (ref 100–199)
HDL: 61 mg/dL (ref >39)
LDL Calculated: 67 mg/dL (ref 0–99)
Triglycerides: 65 mg/dL (ref 0–149)
VLDL Cholesterol Cal: 13 mg/dL (ref 5–40)

## 2018-04-23 ENCOUNTER — Telehealth: Payer: Self-pay | Admitting: *Deleted

## 2018-04-23 NOTE — Telephone Encounter (Signed)
ATC, NA and no option to leave msg 

## 2018-04-23 NOTE — Telephone Encounter (Signed)
-----   Message from Tanda Rockers, MD sent at 07/25/2017  7:29 AM EST ----- Ct chest s contrast f/u nodule  - scan is optional if he still wants it / ok if he doesn't

## 2018-04-28 NOTE — Telephone Encounter (Signed)
ATC, NA and no option to leave msg 

## 2018-05-05 NOTE — Telephone Encounter (Signed)
ATC, NA and no option to leave msg 

## 2018-05-11 ENCOUNTER — Encounter: Payer: Self-pay | Admitting: *Deleted

## 2018-05-11 NOTE — Telephone Encounter (Signed)
Letter mailed

## 2018-11-06 ENCOUNTER — Other Ambulatory Visit: Payer: Self-pay | Admitting: Family Medicine

## 2018-11-11 ENCOUNTER — Telehealth: Payer: Self-pay | Admitting: Family Medicine

## 2018-11-11 NOTE — Telephone Encounter (Signed)
Pt said he needs to come into the office for his physical. He has no way of doing virtual and would rather not reschedule. Will he be ok coming into the office?

## 2018-11-11 NOTE — Telephone Encounter (Signed)
Ok to do in office. Thanks.

## 2018-11-11 NOTE — Telephone Encounter (Signed)
Called pt and he will come in on 11/19/18 @ 8:30

## 2018-11-15 ENCOUNTER — Other Ambulatory Visit: Payer: Self-pay | Admitting: Family Medicine

## 2018-11-15 DIAGNOSIS — E7800 Pure hypercholesterolemia, unspecified: Secondary | ICD-10-CM

## 2018-11-15 DIAGNOSIS — E039 Hypothyroidism, unspecified: Secondary | ICD-10-CM

## 2018-11-15 DIAGNOSIS — Z125 Encounter for screening for malignant neoplasm of prostate: Secondary | ICD-10-CM

## 2018-11-16 ENCOUNTER — Other Ambulatory Visit: Payer: Self-pay

## 2018-11-16 ENCOUNTER — Other Ambulatory Visit: Payer: BLUE CROSS/BLUE SHIELD

## 2018-11-16 ENCOUNTER — Other Ambulatory Visit (INDEPENDENT_AMBULATORY_CARE_PROVIDER_SITE_OTHER): Payer: BLUE CROSS/BLUE SHIELD

## 2018-11-16 DIAGNOSIS — E7800 Pure hypercholesterolemia, unspecified: Secondary | ICD-10-CM | POA: Diagnosis not present

## 2018-11-16 DIAGNOSIS — Z125 Encounter for screening for malignant neoplasm of prostate: Secondary | ICD-10-CM

## 2018-11-16 DIAGNOSIS — E039 Hypothyroidism, unspecified: Secondary | ICD-10-CM | POA: Diagnosis not present

## 2018-11-16 LAB — COMPREHENSIVE METABOLIC PANEL
ALT: 24 U/L (ref 0–53)
AST: 18 U/L (ref 0–37)
Albumin: 4.3 g/dL (ref 3.5–5.2)
Alkaline Phosphatase: 52 U/L (ref 39–117)
BUN: 13 mg/dL (ref 6–23)
CO2: 30 mEq/L (ref 19–32)
Calcium: 9.2 mg/dL (ref 8.4–10.5)
Chloride: 105 mEq/L (ref 96–112)
Creatinine, Ser: 1.27 mg/dL (ref 0.40–1.50)
GFR: 56.56 mL/min — ABNORMAL LOW (ref >60.00)
Glucose, Bld: 103 mg/dL — ABNORMAL HIGH (ref 70–99)
Potassium: 4.7 mEq/L (ref 3.5–5.1)
Sodium: 140 mEq/L (ref 135–145)
Total Bilirubin: 0.5 mg/dL (ref 0.2–1.2)
Total Protein: 6.8 g/dL (ref 6.0–8.3)

## 2018-11-16 LAB — LIPID PANEL
Cholesterol: 137 mg/dL (ref 0–200)
HDL: 58.4 mg/dL (ref >39.00)
LDL Cholesterol: 64 mg/dL (ref 0–99)
NonHDL: 79.08
Total CHOL/HDL Ratio: 2
Triglycerides: 76 mg/dL (ref 0.0–149.0)
VLDL: 15.2 mg/dL (ref 0.0–40.0)

## 2018-11-16 LAB — PSA, MEDICARE: PSA: 0.91 ng/ml (ref 0.10–4.00)

## 2018-11-16 LAB — TSH: TSH: 2.6 u[IU]/mL (ref 0.35–4.50)

## 2018-11-19 ENCOUNTER — Other Ambulatory Visit: Payer: Self-pay

## 2018-11-19 ENCOUNTER — Encounter: Payer: Self-pay | Admitting: Family Medicine

## 2018-11-19 ENCOUNTER — Ambulatory Visit (INDEPENDENT_AMBULATORY_CARE_PROVIDER_SITE_OTHER): Payer: BLUE CROSS/BLUE SHIELD | Admitting: Family Medicine

## 2018-11-19 VITALS — BP 118/70 | HR 70 | Temp 98.2°F | Ht 67.0 in | Wt 201.4 lb

## 2018-11-19 DIAGNOSIS — I700 Atherosclerosis of aorta: Secondary | ICD-10-CM

## 2018-11-19 DIAGNOSIS — E7800 Pure hypercholesterolemia, unspecified: Secondary | ICD-10-CM

## 2018-11-19 DIAGNOSIS — Z0000 Encounter for general adult medical examination without abnormal findings: Secondary | ICD-10-CM

## 2018-11-19 DIAGNOSIS — K219 Gastro-esophageal reflux disease without esophagitis: Secondary | ICD-10-CM | POA: Diagnosis not present

## 2018-11-19 DIAGNOSIS — E669 Obesity, unspecified: Secondary | ICD-10-CM

## 2018-11-19 DIAGNOSIS — E039 Hypothyroidism, unspecified: Secondary | ICD-10-CM

## 2018-11-19 MED ORDER — OMEPRAZOLE 40 MG PO CPDR: 40.0000 mg | DELAYED_RELEASE_CAPSULE | Freq: Every day | ORAL | 3 refills | Status: DC

## 2018-11-19 MED ORDER — EZETIMIBE 10 MG PO TABS: 10.0000 mg | ORAL_TABLET | Freq: Every day | ORAL | 3 refills | Status: DC

## 2018-11-19 MED ORDER — SYNTHROID 100 MCG PO TABS: 100.0000 ug | ORAL_TABLET | Freq: Every day | ORAL | 3 refills | Status: DC

## 2018-11-19 NOTE — Assessment & Plan Note (Signed)
Continue statin. 

## 2018-11-19 NOTE — Progress Notes (Signed)
This visit was conducted in person.  BP 118/70 (BP Location: Left Arm, Patient Position: Sitting, Cuff Size: Normal)   Pulse 70   Temp 98.2 F (36.8 C) (Oral)   Ht _0  (1.702 m)   Wt 201 lb 7 oz (91.4 kg)   SpO2 96%   BMI 31.55 kg/m    CC: CPE Subjective:    Patient ID: Caleb Smith, male    DOB: 1951-01-03, 68 y.o.   MRN: [id number]  HPI: Caleb Smith is a 68 y.o. male presenting on 11/19/2018 for Annual Exam   Takes thyroid medicine with other meds in am (4:30am) and breakfast at 6am. Otherwise may forget meds.  Cardiology recently increased lipitor to 73m daily. Planned f/u 03/2019.  Some ED - last few months.   Preventative: COLONOSCOPY Date: 03/2003 small sigmoid polyp, rpt 5 yrs ([name]. Did not do stool kit last few years. Agrees GI referral for colonoscopy.  Prostate cancer screening - discussed,DRE normal 2018. Deferred DRE today.  Flu shot yearly Pneumovax 2013, prevnar 06/2017. Due for last pneumovax.  Td 2011 Zostavax 2013 Shingrix - 01/2018, 06/2018 Advanced directive - does not have set up. Would want wife then daughters to be HCPOA. Packet provided today.  Seat belt use discussed.  Sunscreen use discussed - doesn't use. No changing moles on skin. Non smoker.  Alcohol - 4 beers/day, more on weekends - see [facility] Dentist - yearly Eye exam yearly  Lives with wife Grown children Occupation: Gen foreman/lineman  Edu: [employer] Activity: active on farm  Diet: some water, fruits/vegetables daily     Relevant past medical, surgical, family and social history reviewed and updated as indicated. Interim medical history since our last visit reviewed. Allergies and medications reviewed and updated. Outpatient Medications Prior to Visit  Medication Sig Dispense Refill  . aspirin 81 MG chewable tablet Chew 81 mg by mouth daily.    .Marland Kitchenatorvastatin (LIPITOR) 80 MG tablet TAKE 1 TABLET BY MOUTH EVERY DAY 90 tablet 1  . cholecalciferol (VITAMIN D)  1000 units tablet Take 1,000 Units by mouth daily.    . vitamin B-12 (CYANOCOBALAMIN) 500 MCG tablet Take 500 mcg by mouth daily.    .Marland Kitchenezetimibe (ZETIA) 10 MG tablet Take 1 tablet (10 mg total) by mouth daily. 90 tablet 3  . omeprazole (PRILOSEC) 40 MG capsule Take 1 capsule (40 mg total) by mouth daily. 90 capsule 3  . SYNTHROID 100 MCG tablet Take 1 tablet (100 mcg total) by mouth daily before breakfast. 90 tablet 3   No facility-administered medications prior to visit.      Per HPI unless specifically indicated in ROS section below Review of Systems  Constitutional: Negative for activity change, appetite change, chills, fatigue, fever and unexpected weight change.  HENT: Negative for hearing loss.   Eyes: Negative for visual disturbance.  Respiratory: Positive for cough (chronic AM cough, worse in spring - attributed to allergic rhinitis). Negative for chest tightness, shortness of breath and wheezing.   Cardiovascular: Negative for chest pain, palpitations and leg swelling.  Gastrointestinal: Negative for abdominal distention, abdominal pain, blood in stool, constipation, diarrhea, nausea and vomiting.  Genitourinary: Negative for difficulty urinating and hematuria.  Musculoskeletal: Negative for arthralgias, myalgias and neck pain.  Skin: Negative for rash.  Neurological: Negative for dizziness, seizures, syncope and headaches.  Hematological: Negative for adenopathy. Bruises/bleeds easily.  Psychiatric/Behavioral: Negative for dysphoric mood. The patient is not nervous/anxious.    Objective:    BP 118/70 (BP Location:  Left Arm, Patient Position: Sitting, Cuff Size: Normal)   Pulse 70   Temp 98.2 F (36.8 C) (Oral)   Ht '5\' 7"'$  (1.702 m)   Wt 201 lb 7 oz (91.4 kg)   SpO2 96%   BMI 31.55 kg/m   Wt Readings from Last 3 Encounters:  11/19/18 201 lb 7 oz (91.4 kg)  11/13/17 202 lb 4 oz (91.7 kg)  10/02/17 202 lb (91.6 kg)    Physical Exam Vitals signs and nursing note  reviewed.  Constitutional:      General: He is not in acute distress.    Appearance: Normal appearance. He is well-developed. He is not ill-appearing.  HENT:     Head: Normocephalic and atraumatic.     Right Ear: Hearing, tympanic membrane, ear canal and external ear normal.     Left Ear: Hearing, tympanic membrane, ear canal and external ear normal.     Nose: Nose normal.     Mouth/Throat:     Mouth: Mucous membranes are moist.     Pharynx: Uvula midline. No oropharyngeal exudate or posterior oropharyngeal erythema.  Eyes:     General: No scleral icterus.    Conjunctiva/sclera: Conjunctivae normal.     Pupils: Pupils are equal, round, and reactive to light.  Neck:     Musculoskeletal: Normal range of motion and neck supple.  Cardiovascular:     Rate and Rhythm: Normal rate and regular rhythm.     Pulses: Normal pulses.          Radial pulses are 2+ on the right side and 2+ on the left side.     Heart sounds: Normal heart sounds. No murmur.  Pulmonary:     Effort: Pulmonary effort is normal. No respiratory distress.     Breath sounds: Normal breath sounds. No wheezing, rhonchi or rales.  Abdominal:     General: Bowel sounds are normal. There is no distension.     Palpations: Abdomen is soft. There is no mass.     Tenderness: There is no abdominal tenderness. There is no guarding or rebound.  Musculoskeletal: Normal range of motion.  Lymphadenopathy:     Cervical: No cervical adenopathy.  Skin:    General: Skin is warm and dry.     Findings: No rash.  Neurological:     Mental Status: He is alert and oriented to person, place, and time.     Comments: CN grossly intact, station and gait intact  Psychiatric:        Behavior: Behavior normal.        Thought Content: Thought content normal.        Judgment: Judgment normal.       Results for orders placed or performed in visit on 11/16/18  PSA, Medicare  Result Value Ref Range   PSA 0.91 0.10 - 4.00 ng/ml  TSH  Result  Value Ref Range   TSH 2.60 0.35 - 4.50 uIU/mL  Lipid panel  Result Value Ref Range   Cholesterol 137 0 - 200 mg/dL   Triglycerides 76.0 0.0 - 149.0 mg/dL   HDL 58.40 >39.00 mg/dL   VLDL 15.2 0.0 - 40.0 mg/dL   LDL Cholesterol 64 0 - 99 mg/dL   Total CHOL/HDL Ratio 2    NonHDL 79.08   Comprehensive metabolic panel  Result Value Ref Range   Sodium 140 135 - 145 mEq/L   Potassium 4.7 3.5 - 5.1 mEq/L   Chloride 105 96 - 112 mEq/L   CO2 30  19 - 32 mEq/L   Glucose, Bld 103 (H) 70 - 99 mg/dL   BUN 13 6 - 23 mg/dL   Creatinine, Ser 1.27 0.40 - 1.50 mg/dL   Total Bilirubin 0.5 0.2 - 1.2 mg/dL   Alkaline Phosphatase 52 39 - 117 U/L   AST 18 0 - 37 U/L   ALT 24 0 - 53 U/L   Total Protein 6.8 6.0 - 8.3 g/dL   Albumin 4.3 3.5 - 5.2 g/dL   Calcium 9.2 8.4 - 10.5 mg/dL   GFR 56.56 (L) >60.00 mL/min   Assessment & Plan:   Problem List Items Addressed This Visit    Obesity, Class I, BMI 30-34.9    Encouraged healthy diet and lifestyle changes to affect sustainable weight loss.       Hypothyroidism    Chronic, stable. Continue current regimen. Takes levothyroxine with PPI, otherwise may forget.       Relevant Medications   SYNTHROID 100 MCG tablet   HLD (hyperlipidemia)    Chronic, stable. Appreciate cards care. The 10-year ASCVD risk score Mikey Bussing DC Brooke Bonito., et al., 2013) is: 9.4%   Values used to calculate the score:     Age: 79 years     Sex: Male     Is Non-Hispanic African American: No     Diabetic: No     Tobacco smoker: No     Systolic Blood Pressure: 767 mmHg     Is BP treated: No     HDL Cholesterol: 58.4 mg/dL     Total Cholesterol: 137 mg/dL       Relevant Medications   ezetimibe (ZETIA) 10 MG tablet   Health maintenance examination - Primary    Preventative protocols reviewed and updated unless pt declined. Discussed healthy diet and lifestyle.       GERD (gastroesophageal reflux disease)    Chronic, stable on daily PPI. Continue.       Relevant Medications    omeprazole (PRILOSEC) 40 MG capsule   Atherosclerosis of aorta (HCC)    Continue statin.       Relevant Medications   ezetimibe (ZETIA) 10 MG tablet       Meds ordered this encounter  Medications  . ezetimibe (ZETIA) 10 MG tablet    Sig: Take 1 tablet (10 mg total) by mouth daily.    Dispense:  90 tablet    Refill:  3  . omeprazole (PRILOSEC) 40 MG capsule    Sig: Take 1 capsule (40 mg total) by mouth daily.    Dispense:  90 capsule    Refill:  3  . SYNTHROID 100 MCG tablet    Sig: Take 1 tablet (100 mcg total) by mouth daily before breakfast.    Dispense:  90 tablet    Refill:  3   No orders of the defined types were placed in this encounter.   Follow up plan: Return in about 1 year (around 11/19/2019) for annual exam, prior fasting for blood work.  Ria Bush, MD

## 2018-11-19 NOTE — Assessment & Plan Note (Signed)
Chronic, stable on daily PPI. Continue.

## 2018-11-19 NOTE — Assessment & Plan Note (Signed)
Preventative protocols reviewed and updated unless pt declined. Discussed healthy diet and lifestyle.  

## 2018-11-19 NOTE — Assessment & Plan Note (Signed)
Encouraged healthy diet and lifestyle changes to affect sustainable weight loss.  

## 2018-11-19 NOTE — Patient Instructions (Addendum)
We will refer you for colonoscopy.  Make sure to stay well hydrated for kidneys.  Back off alcohol some.  You are doing well today, continue current medicines.  Return as needed or in 1 year for next physical.   Health Maintenance After Age 67 After age 59, you are at a higher risk for certain long-term diseases and infections as well as injuries from falls. Falls are a major cause of broken bones and head injuries in people who are older than age 61. Getting regular preventive care can help to keep you healthy and well. Preventive care includes getting regular testing and making lifestyle changes as recommended by your health care provider. Talk with your health care provider about:  Which screenings and tests you should have. A screening is a test that checks for a disease when you have no symptoms.  A diet and exercise plan that is right for you. What should I know about screenings and tests to prevent falls? Screening and testing are the best ways to find a health problem early. Early diagnosis and treatment give you the best chance of managing medical conditions that are common after age 17. Certain conditions and lifestyle choices may make you more likely to have a fall. Your health care provider may recommend:  Regular vision checks. Poor vision and conditions such as cataracts can make you more likely to have a fall. If you wear glasses, make sure to get your prescription updated if your vision changes.  Medicine review. Work with your health care provider to regularly review all of the medicines you are taking, including over-the-counter medicines. Ask your health care provider about any side effects that may make you more likely to have a fall. Tell your health care provider if any medicines that you take make you feel dizzy or sleepy.  Osteoporosis screening. Osteoporosis is a condition that causes the bones to get weaker. This can make the bones weak and cause them to break more  easily.  Blood pressure screening. Blood pressure changes and medicines to control blood pressure can make you feel dizzy.  Strength and balance checks. Your health care provider may recommend certain tests to check your strength and balance while standing, walking, or changing positions.  Foot health exam. Foot pain and numbness, as well as not wearing proper footwear, can make you more likely to have a fall.  Depression screening. You may be more likely to have a fall if you have a fear of falling, feel emotionally low, or feel unable to do activities that you used to do.  Alcohol use screening. Using too much alcohol can affect your balance and may make you more likely to have a fall. What actions can I take to lower my risk of falls? General instructions  Talk with your health care provider about your risks for falling. Tell your health care provider if: ? You fall. Be sure to tell your health care provider about all falls, even ones that seem minor. ? You feel dizzy, sleepy, or off-balance.  Take over-the-counter and prescription medicines only as told by your health care provider. These include any supplements.  Eat a healthy diet and maintain a healthy weight. A healthy diet includes low-fat dairy products, low-fat (lean) meats, and fiber from whole grains, beans, and lots of fruits and vegetables. Home safety  Remove any tripping hazards, such as rugs, cords, and clutter.  Install safety equipment such as grab bars in bathrooms and safety rails on stairs.  Keep  rooms and walkways well-lit. Activity   Follow a regular exercise program to stay fit. This will help you maintain your balance. Ask your health care provider what types of exercise are appropriate for you.  If you need a cane or walker, use it as recommended by your health care provider.  Wear supportive shoes that have nonskid soles. Lifestyle  Do not drink alcohol if your health care provider tells you not to  drink.  If you drink alcohol, limit how much you have: ? 0-1 drink a day for women. ? 0-2 drinks a day for men.  Be aware of how much alcohol is in your drink. In the [place] one drink equals one typical bottle of beer (12 oz), one-half glass of wine (5 oz), or one shot of hard liquor (1 oz).  Do not use any products that contain nicotine or tobacco, such as cigarettes and e-cigarettes. If you need help quitting, ask your health care provider. Summary  Having a healthy lifestyle and getting preventive care can help to protect your health and wellness after age 2.  Screening and testing are the best way to find a health problem early and help you avoid having a fall. Early diagnosis and treatment give you the best chance for managing medical conditions that are more common for people who are older than age 86.  Falls are a major cause of broken bones and head injuries in people who are older than age 45. Take precautions to prevent a fall at home.  Work with your health care provider to learn what changes you can make to improve your health and wellness and to prevent falls. This information is not intended to replace advice given to you by your health care provider. Make sure you discuss any questions you have with your health care provider. Document Released: 04/29/2017 Document Revised: 04/29/2017 Document Reviewed: 04/29/2017 Elsevier Interactive Patient Education  2019 [employer].

## 2018-11-19 NOTE — Assessment & Plan Note (Signed)
Chronic, stable. Appreciate cards care. The 10-year ASCVD risk score Mikey Bussing DC Brooke Bonito., et al., 2013) is: 9.4%   Values used to calculate the score:     Age: 68 years     Sex: Male     Is Non-Hispanic African American: No     Diabetic: No     Tobacco smoker: No     Systolic Blood Pressure: 881 mmHg     Is BP treated: No     HDL Cholesterol: 58.4 mg/dL     Total Cholesterol: 137 mg/dL

## 2018-11-19 NOTE — Assessment & Plan Note (Signed)
Chronic, stable. Continue current regimen. Takes levothyroxine with PPI, otherwise may forget.

## 2019-02-10 ENCOUNTER — Encounter (INDEPENDENT_AMBULATORY_CARE_PROVIDER_SITE_OTHER): Payer: Self-pay

## 2019-02-10 ENCOUNTER — Other Ambulatory Visit: Payer: Self-pay

## 2019-02-10 ENCOUNTER — Ambulatory Visit: Payer: BC Managed Care – PPO | Admitting: Cardiology

## 2019-02-10 ENCOUNTER — Encounter: Payer: Self-pay | Admitting: Cardiology

## 2019-02-10 VITALS — BP 133/83 | HR 56 | Ht 67.0 in | Wt 196.2 lb

## 2019-02-10 DIAGNOSIS — I2510 Atherosclerotic heart disease of native coronary artery without angina pectoris: Secondary | ICD-10-CM

## 2019-02-10 DIAGNOSIS — E785 Hyperlipidemia, unspecified: Secondary | ICD-10-CM

## 2019-02-10 NOTE — Patient Instructions (Addendum)
Your physician recommends that you continue on your current medications as directed. Please refer to the Current Medication list given to you today.   Your physician wants you to follow-up in: YEAR WITH DR TURNER You will receive a reminder letter in the mail two months in advance. If you don't receive a letter, please call our office to schedule the follow-up appointment.  

## 2019-02-10 NOTE — Progress Notes (Signed)
Cardiology Office Note:    Date:  02/10/2019   ID:  Smith, Caleb 08-13-1950, MRN [id number]  PCP:  Ria Bush, MD  Cardiologist:  No primary care provider on file.    Referring MD: Ria Bush, MD   Chief Complaint  Patient presents with  . Follow-up    Coronary artery calcifications and HLD    History of Present Illness:    Caleb Smith is a 68 y.o. male with a hx of GERD, HLD and recent chest CT showing coronary artery calcifications.   His had a stress test a year ago that showed no ischemia.  he is here today for followup and is doing well.  He denies any chest pain or pressure, SOB, DOE, PND, orthopnea, LE edema, dizziness, palpitations or syncope. He is compliant with his meds and is tolerating meds with no SE.    Past Medical History:  Diagnosis Date  . Coronary artery calcification seen on CAT scan 08/21/2017  . GERD (gastroesophageal reflux disease) 1995   s/p esoph dilation for stricture  . HLD (hyperlipidemia)   . Hypothyroidism   . Obesity 07/14/2014    Past Surgical History:  Procedure Laterality Date  . CARDIOVASCULAR STRESS TEST  07/2017   low risk study ([facility])  . COLONOSCOPY  03/2003   small sigmoid polyp, rpt 5 yrs [name])  . ESOPHAGOGASTRODUODENOSCOPY  1995   s/p esophageal dilation  . FACIAL RECONSTRUCTION SURGERY  1990s   cow headbutted him  . FINGER SURGERY Left teenager   reattachment of 2nd,3rd,4th    Current Medications: Current Meds  Medication Sig  . aspirin 81 MG chewable tablet Chew 81 mg by mouth daily.  Marland Kitchen atorvastatin (LIPITOR) 80 MG tablet TAKE 1 TABLET BY MOUTH EVERY DAY  . cholecalciferol (VITAMIN D) 1000 units tablet Take 1,000 Units by mouth daily.  Marland Kitchen ezetimibe (ZETIA) 10 MG tablet Take 1 tablet (10 mg total) by mouth daily.  Marland Kitchen omeprazole (PRILOSEC) 40 MG capsule Take 1 capsule (40 mg total) by mouth daily.  Marland Kitchen SYNTHROID 100 MCG tablet Take 1 tablet (100 mcg total) by mouth daily before  breakfast.  . vitamin B-12 (CYANOCOBALAMIN) 500 MCG tablet Take 500 mcg by mouth daily.     Allergies:   Patient has no known allergies.   Social History   Socioeconomic History  . Marital status: Married    Spouse name: Not on file  . Number of children: Not on file  . Years of education: Not on file  . Highest education level: Not on file  Occupational History  . Not on file  Social Needs  . Financial resource strain: Not on file  . Food insecurity    Worry: Not on file    Inability: Not on file  . Transportation needs    Medical: Not on file    Non-medical: Not on file  Tobacco Use  . Smoking status: Never Smoker  . Smokeless tobacco: Former Systems developer    Types: Chew  Substance and Sexual Activity  . Alcohol use: Yes    Alcohol/week: 0.0 standard drinks    Comment: 2-3 beer daily  . Drug use: No  . Sexual activity: Not on file  Lifestyle  . Physical activity    Days per week: Not on file    Minutes per session: Not on file  . Stress: Not on file  Relationships  . Social Herbalist on phone: Not on file    Gets together: Not  on file    Attends religious service: Not on file    Active member of club or organization: Not on file    Attends meetings of clubs or organizations: Not on file    Relationship status: Not on file  Other Topics Concern  . Not on file  Social History Narrative   Lives with wife   Grown children   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[employer]   Edu: [employer]   Activity: active on farm   Diet: good water, fruits/vegetables daily     Family History: The patient's family history includes CAD (age of onset: 37) in his maternal uncle; Cancer in his mother; Suicidality in his father. There is no history of Stroke, Diabetes, or Hypertension.  ROS:   Please see the history of present illness.    ROS  All other systems reviewed and negative.   EKGs/Labs/Other Studies Reviewed:    The following studies were reviewed today: none   EKG:  EKG is  ordered today.  The ekg ordered today demonstrates sinus brady with no ST changes  Recent Labs: 11/16/2018: ALT 24; BUN 13; Creatinine, Ser 1.27; Potassium 4.7; Sodium 140; TSH 2.60   Recent Lipid Panel    Component Value Date/Time   CHOL 137 11/16/2018 0844   CHOL 141 02/05/2018 0736   CHOL 204 07/17/2014   TRIG 76.0 11/16/2018 0844   TRIG 58 07/17/2014   HDL 58.40 11/16/2018 0844   HDL 61 02/05/2018 0736   CHOLHDL 2 11/16/2018 0844   VLDL 15.2 11/16/2018 0844   LDLCALC 64 11/16/2018 0844   LDLCALC 67 02/05/2018 0736   LDLCALC 133 07/17/2014    Physical Exam:    VS:  BP 133/83 (BP Location: Right Arm, Patient Position: Sitting, Cuff Size: Normal)   Pulse (!) 56   Ht 5\' 7"  (1.702 m)   Wt 196 lb 3.2 oz (89 kg)   BMI 30.73 kg/m     Wt Readings from Last 3 Encounters:  02/10/19 196 lb 3.2 oz (89 kg)  11/19/18 201 lb 7 oz (91.4 kg)  11/13/17 202 lb 4 oz (91.7 kg)     GEN:  Well nourished, well developed in no acute distress HEENT: Normal NECK: No JVD; No carotid bruits LYMPHATICS: No lymphadenopathy CARDIAC: RRR, no murmurs, rubs, gallops RESPIRATORY:  Clear to auscultation without rales, wheezing or rhonchi  ABDOMEN: Soft, non-tender, non-distended MUSCULOSKELETAL:  No edema; No deformity  SKIN: Warm and dry NEUROLOGIC:  Alert and oriented x 3 PSYCHIATRIC:  Normal affect   ASSESSMENT:    1. Coronary artery calcification seen on CAT scan   2. Hyperlipidemia, unspecified hyperlipidemia type    PLAN:    In order of problems listed above:  1.  Coronary artery calcification -this was noted on prior Chest CT -nuclear stress test a year ago showed no ischemia. -he has no anginal sx. -continue on ASA 81mg  daily and statin.  2.  Hyperlipidemia -LDL goal I < 70.  -his LDL was 64 in May 2020. -continue on atorvastatin 80mg  daily and Zetia 10mg  daily.     Medication Adjustments/Labs and Tests Ordered: Current medicines are reviewed at length with  the patient today.  Concerns regarding medicines are outlined above.  No orders of the defined types were placed in this encounter.  No orders of the defined types were placed in this encounter.   Signed, Fransico Him, MD  02/10/2019 2:08 PM    [place]

## 2019-02-18 ENCOUNTER — Ambulatory Visit: Payer: BLUE CROSS/BLUE SHIELD | Admitting: Cardiology

## 2019-04-29 ENCOUNTER — Other Ambulatory Visit: Payer: Self-pay | Admitting: Family Medicine

## 2019-06-08 ENCOUNTER — Ambulatory Visit: Payer: Self-pay

## 2019-06-08 ENCOUNTER — Ambulatory Visit (INDEPENDENT_AMBULATORY_CARE_PROVIDER_SITE_OTHER): Payer: BC Managed Care – PPO | Admitting: Orthopedic Surgery

## 2019-06-08 ENCOUNTER — Telehealth: Payer: Self-pay | Admitting: Radiology

## 2019-06-08 ENCOUNTER — Other Ambulatory Visit: Payer: Self-pay

## 2019-06-08 DIAGNOSIS — M25561 Pain in right knee: Secondary | ICD-10-CM | POA: Diagnosis not present

## 2019-06-08 DIAGNOSIS — I2510 Atherosclerotic heart disease of native coronary artery without angina pectoris: Secondary | ICD-10-CM

## 2019-06-08 DIAGNOSIS — G8929 Other chronic pain: Secondary | ICD-10-CM

## 2019-06-08 DIAGNOSIS — M1711 Unilateral primary osteoarthritis, right knee: Secondary | ICD-10-CM | POA: Diagnosis not present

## 2019-06-08 NOTE — Telephone Encounter (Signed)
Request right knee synvisc injection

## 2019-06-10 ENCOUNTER — Telehealth: Payer: Self-pay

## 2019-06-10 NOTE — Telephone Encounter (Signed)
Submitted VOB for SynviscOne, right knee. 

## 2019-06-10 NOTE — Telephone Encounter (Signed)
Noted  

## 2019-06-11 ENCOUNTER — Encounter: Payer: Self-pay | Admitting: Orthopedic Surgery

## 2019-06-11 MED ORDER — BUPIVACAINE HCL 0.25 % IJ SOLN
4.0000 mL | INTRAMUSCULAR | Status: AC | PRN
  Administered 2019-06-11: 4 mL via INTRA_ARTICULAR

## 2019-06-11 MED ORDER — METHYLPREDNISOLONE ACETATE 40 MG/ML IJ SUSP
40.0000 mg | INTRAMUSCULAR | Status: AC | PRN
  Administered 2019-06-11: 09:00:00 40 mg via INTRA_ARTICULAR

## 2019-06-11 MED ORDER — LIDOCAINE HCL 1 % IJ SOLN
5.0000 mL | INTRAMUSCULAR | Status: AC | PRN
  Administered 2019-06-11: 5 mL

## 2019-06-11 NOTE — Progress Notes (Addendum)
Office Visit Note   Patient: Caleb Smith           Date of Birth: [birth year]           MRN: [id number] Visit Date: 06/08/2019 Requested by: Ria Bush, MD [place],  [place] [id number] PCP: Ria Bush, MD  Subjective: Chief Complaint  Patient presents with  . Right Knee - Pain    HPI: Dmoni is a patient with right knee pain.  He hurt his knee in the summer when he twisted it getting out of a pickup truck.  Did get better but over the last month he has been having more pain and swelling now and he localizes it to the medial aspect of the knee.  Takes Motrin with relief.  He reports a pulling sensation in the posterior aspect of his knee.  Walking downhill hurts him.  Had one episode where the pain would wake him from sleep at night.  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              ROS: All systems reviewed are negative as they relate to the chief complaint within the history of present illness.  Patient denies  fevers or chills.   Assessment & Plan: Visit Diagnoses:  1. Chronic pain of right knee     Plan: Impression is right knee pain with medial compartment arthritis.  Plan is cortisone injection today with preapproval of Synvisc for when that shot wears off.  I think he is likely heading at some point in the future for partial versus total knee replacement.  However for now his symptoms are fairly manageable.  We will see him back in several weeks once that cortisone shot wears off.  Gel injection to be performed at that time. This patient is diagnosed with osteoarthritis of the knee(s).    Radiographs show evidence of joint space narrowing, osteophytes, subchondral sclerosis and/or subchondral cysts.  This patient has knee pain which interferes with functional and activities of daily living.    This patient has experienced  inadequate response, adverse effects and/or intolerance with conservative treatments such as acetaminophen, NSAIDS, topical creams, physical therapy or regular exercise, knee bracing and/or weight loss.   This patient has experienced inadequate response or has a contraindication to intra articular steroid injections for at least 3 months.   This patient is not scheduled to have a total knee replacement within 6 months of starting treatment with viscosupplementation.   Follow-Up Instructions: Return if symptoms worsen or fail to improve.   Orders:  Orders Placed This Encounter  Procedures  . XR KNEE 3 VIEW RIGHT   No orders of the defined types were placed in this encounter.     Procedures: Large Joint Inj: R knee on 06/11/2019 8:43 AM Indications: diagnostic evaluation, joint swelling and pain Details: 18 G 1.5 in needle, superolateral approach  Arthrogram: No  Medications: 5 mL lidocaine 1 %; 40 mg methylPREDNISolone acetate 40 MG/ML; 4 mL bupivacaine 0.25 % Outcome: tolerated well, no immediate complications Procedure, treatment alternatives, risks and benefits explained, specific risks discussed. Consent was given by the patient. Immediately prior to procedure a time out was called to verify the correct patient, procedure, equipment, support staff and site/side marked as required. Patient was prepped and draped in the usual sterile fashion.  Clinical Data: No additional findings.  Objective: Vital Signs: There were no vitals taken for this visit.  Physical Exam:   Constitutional: Patient appears well-developed HEENT:  Head: Normocephalic Eyes:EOM are normal Neck: Normal range of motion Cardiovascular: Normal rate Pulmonary/chest: Effort normal Neurologic: Patient is alert Skin: Skin is warm Psychiatric: Patient has normal mood and affect    Ortho Exam: Ortho exam demonstrates slightly antalgic gait to the right.  He has medial greater than lateral joint  line tenderness.  Extensor mechanism is intact.  Knee has flexion easily past 90 and almost full extension.  No effusion.  Collateral and cruciate ligaments are stable.  Pedal pulses palpable.  Specialty Comments:  No specialty comments available.  Imaging: No results found.   PMFS History: Patient Active Problem List   Diagnosis Date Noted  . Advanced care planning/counseling discussion 11/13/2017  . Coronary artery calcification seen on CAT scan 08/21/2017  . Atherosclerosis of aorta ([place]) 07/03/2017  . Lung nodule, solitary 06/28/2017  . Pedal edema 11/05/2016  . Upper airway cough syndrome 06/26/2016  . Plantar fasciitis, right 10/30/2015  . Health maintenance examination 10/27/2014  . Lesion of nose 07/14/2014  . Obesity, Class I, BMI 30-34.9 07/14/2014  . Hypothyroidism   . HLD (hyperlipidemia)   . GERD (gastroesophageal reflux disease)    Past Medical History:  Diagnosis Date  . Coronary artery calcification seen on CAT scan 08/21/2017  . GERD (gastroesophageal reflux disease) 1995   s/p esoph dilation for stricture  . HLD (hyperlipidemia)   . Hypothyroidism   . Obesity 07/14/2014    Family History  Problem Relation Age of Onset  . Cancer Mother        breast  . CAD Maternal Uncle 66       massive MI  . Suicidality Father        suicide  . Stroke Neg Hx   . Diabetes Neg Hx   . Hypertension Neg Hx     Past Surgical History:  Procedure Laterality Date  . CARDIOVASCULAR STRESS TEST  07/2017   low risk study ([facility])  . COLONOSCOPY  03/2003   small sigmoid polyp, rpt 5 yrs [name])  . ESOPHAGOGASTRODUODENOSCOPY  1995   s/p esophageal dilation  . FACIAL RECONSTRUCTION SURGERY  1990s   cow headbutted him  . FINGER SURGERY Left teenager   reattachment of 2nd,3rd,4th   Social History   Occupational History  . Not on file  Tobacco Use  . Smoking status: Never Smoker  . Smokeless tobacco: Former Systems developer    Types: Chew  Substance and Sexual Activity  .  Alcohol use: Yes    Alcohol/week: 0.0 standard drinks    Comment: 2-3 beer daily  . Drug use: No  . Sexual activity: Not on file

## 2019-06-13 ENCOUNTER — Telehealth: Payer: Self-pay

## 2019-06-13 NOTE — Telephone Encounter (Signed)
PA required for SynviscOne, right knee. Faxed completed PA form to [employer] at [phone]. 

## 2019-06-15 ENCOUNTER — Telehealth: Payer: Self-pay

## 2019-06-15 NOTE — Telephone Encounter (Signed)
Approved for SynviscOne, right knee. Buy & Bill Covered at 100% after Co-pay Co-pay of $150.00 required PA required PA Approval# [id number] Valid 06/13/2019- 06/12/2020  Appt. 06/22/2019 with Dr. Marlou Sa

## 2019-06-22 ENCOUNTER — Ambulatory Visit: Payer: BC Managed Care – PPO | Admitting: Orthopedic Surgery

## 2019-06-22 ENCOUNTER — Other Ambulatory Visit: Payer: Self-pay

## 2019-06-22 DIAGNOSIS — M1711 Unilateral primary osteoarthritis, right knee: Secondary | ICD-10-CM

## 2019-06-23 ENCOUNTER — Encounter: Payer: Self-pay | Admitting: Orthopedic Surgery

## 2019-06-23 DIAGNOSIS — M1711 Unilateral primary osteoarthritis, right knee: Secondary | ICD-10-CM | POA: Diagnosis not present

## 2019-06-23 MED ORDER — HYLAN G-F 20 48 MG/6ML IX SOSY
48.0000 mg | PREFILLED_SYRINGE | INTRA_ARTICULAR | Status: AC | PRN
  Administered 2019-06-23: 48 mg via INTRA_ARTICULAR

## 2019-06-23 MED ORDER — LIDOCAINE HCL 1 % IJ SOLN
5.0000 mL | INTRAMUSCULAR | Status: AC | PRN
  Administered 2019-06-23: 5 mL

## 2019-06-23 NOTE — Progress Notes (Signed)
   Procedure Note  Patient: Caleb Smith             Date of Birth: [birth year]           MRN: [id number]             Visit Date: 06/22/2019  Procedures: Visit Diagnoses:  1. Arthritis of right knee     Large Joint Inj: R knee on 06/23/2019 7:29 AM Indications: pain, joint swelling and diagnostic evaluation Details: 18 G 1.5 in needle, superolateral approach  Arthrogram: No  Medications: 5 mL lidocaine 1 %; 48 mg Hylan 48 MG/6ML Outcome: tolerated well, no immediate complications Procedure, treatment alternatives, risks and benefits explained, specific risks discussed. Consent was given by the patient. Immediately prior to procedure a time out was called to verify the correct patient, procedure, equipment, support staff and site/side marked as required. Patient was prepped and draped in the usual sterile fashion.

## 2019-07-12 ENCOUNTER — Telehealth: Payer: Self-pay

## 2019-07-12 NOTE — Telephone Encounter (Signed)
Patient wishes to proceed with TKA. Please complete blue sheet so patient can be contacted about getting set up. He also Is asking if you can call him to discuss typical process for surgery.

## 2019-07-13 NOTE — Telephone Encounter (Signed)
Done thx

## 2019-07-18 ENCOUNTER — Encounter: Payer: Self-pay | Admitting: Family Medicine

## 2019-07-18 ENCOUNTER — Ambulatory Visit (INDEPENDENT_AMBULATORY_CARE_PROVIDER_SITE_OTHER): Payer: BC Managed Care – PPO | Admitting: Family Medicine

## 2019-07-18 ENCOUNTER — Other Ambulatory Visit: Payer: Self-pay

## 2019-07-18 VITALS — BP 130/72 | HR 65 | Temp 97.9°F | Ht 67.0 in | Wt 202.4 lb

## 2019-07-18 DIAGNOSIS — S21101A Unspecified open wound of right front wall of thorax without penetration into thoracic cavity, initial encounter: Secondary | ICD-10-CM

## 2019-07-18 DIAGNOSIS — W5511XA Bitten by horse, initial encounter: Secondary | ICD-10-CM | POA: Diagnosis not present

## 2019-07-18 MED ORDER — CEPHALEXIN 500 MG PO CAPS: 500.0000 mg | ORAL_CAPSULE | Freq: Three times a day (TID) | ORAL | 0 refills | Status: DC

## 2019-07-18 NOTE — Addendum Note (Signed)
Addended by: Ria Bush on: 07/18/2019 11:50 AM   Modules accepted: Level of Service

## 2019-07-18 NOTE — Progress Notes (Signed)
This visit was conducted in person.  BP 130/72 (BP Location: Left Arm, Patient Position: Sitting, Cuff Size: Large)   Pulse 65   Temp 97.9 F (36.6 C) (Temporal)   Ht 5\' 7"  (1.702 m)   Wt 202 lb 7 oz (91.8 kg)   SpO2 96%   BMI 31.71 kg/m    CC: horse bite  Subjective:    Patient ID: Caleb Smith, male    DOB: 1950/08/01, 69 y.o.   MRN: [id number]  HPI: Caleb Smith is a 69 y.o. male presenting on 07/18/2019 for Animal Bite (Pt was bitten on right chest near armpit by a horse.  Area is a little red and is firm.  Injury happened on 07/08/19.) and Hearing Loss (C/o hearing loss in left ear.  Pt was in the wind all day on 07/16/19.  Afterwards, pt states he was not able to hear.  Ablel to hear  again today. Denies any pain.  A)   DOI: 07/07/2019 Bit on right chest by his horse. He did have thick coat on. Broke skin some, treating with neosporin ointment. Area started draining 2 days ago.   L hearing loss noted over the weekend. Now resolved after he yawned loud. No recent congestion, ST, PNdrainage. Not on allergy medication.   Upcoming R knee replacement Marlou Sa).      Relevant past medical, surgical, family and social history reviewed and updated as indicated. Interim medical history since our last visit reviewed. Allergies and medications reviewed and updated. Outpatient Medications Prior to Visit  Medication Sig Dispense Refill  . aspirin 81 MG chewable tablet Chew 81 mg by mouth daily.    Marland Kitchen atorvastatin (LIPITOR) 80 MG tablet TAKE 1 TABLET BY MOUTH EVERY DAY 90 tablet 2  . cholecalciferol (VITAMIN D) 1000 units tablet Take 1,000 Units by mouth daily.    Marland Kitchen ezetimibe (ZETIA) 10 MG tablet Take 1 tablet (10 mg total) by mouth daily. 90 tablet 3  . omeprazole (PRILOSEC) 40 MG capsule Take 1 capsule (40 mg total) by mouth daily. 90 capsule 3  . SYNTHROID 100 MCG tablet Take 1 tablet (100 mcg total) by mouth daily before breakfast. 90 tablet 3  . vitamin B-12  (CYANOCOBALAMIN) 500 MCG tablet Take 500 mcg by mouth daily.     No facility-administered medications prior to visit.     Per HPI unless specifically indicated in ROS section below Review of Systems Objective:    BP 130/72 (BP Location: Left Arm, Patient Position: Sitting, Cuff Size: Large)   Pulse 65   Temp 97.9 F (36.6 C) (Temporal)   Ht 5\' 7"  (1.702 m)   Wt 202 lb 7 oz (91.8 kg)   SpO2 96%   BMI 31.71 kg/m   Wt Readings from Last 3 Encounters:  07/18/19 202 lb 7 oz (91.8 kg)  02/10/19 196 lb 3.2 oz (89 kg)  11/19/18 201 lb 7 oz (91.4 kg)    Physical Exam Vitals and nursing note reviewed.  Constitutional:      Appearance: Normal appearance. He is not ill-appearing.  HENT:     Head: Normocephalic and atraumatic.     Right Ear: Hearing, tympanic membrane, ear canal and external ear normal.     Left Ear: Hearing, tympanic membrane, ear canal and external ear normal.  Musculoskeletal:        General: Normal range of motion.  Skin:    Findings: Wound present.          Comments: Large wound  under R axilla of denuded skin with scab along lower part of wound, dry granulation tissue with mild surrounding erythema, ecchymosis inferior to wound  Neurological:     Mental Status: He is alert.    Wound dressed with triple abx ointment and nonstick gauze.     Assessment & Plan:  This visit occurred during the SARS-CoV-2 public health emergency.  Safety protocols were in place, including screening questions prior to the visit, additional usage of staff PPE, and extensive cleaning of exam room while observing appropriate contact time as indicated for disinfecting solutions.   Hearing loss has resolved. Bilateral TMs pearly grey with some wax in canals.  Problem List Items Addressed This Visit    Open wound of right chest wall - Primary    Poorly healing wound anticipate due to overly dry wound. Recommend increase amt of neosporin each dressing change, recommended covering wound  after applying neosporin. Will Rx short keflex course to cover any possible infection contributing to slow wound healing. Home care instructions provided.        Other Visit Diagnoses    Horse bite, initial encounter           Meds ordered this encounter  Medications  . cephALEXin (KEFLEX) 500 MG capsule    Sig: Take 1 capsule (500 mg total) by mouth 3 (three) times daily.    Dispense:  15 capsule    Refill:  0   No orders of the defined types were placed in this encounter.   Patient Instructions  Increase amount of neosporin you're using to help skin continue to heal. Take keflex antibiotic three times daily for the next 5 days.  Let us know if any streaking redness or draining develops.  Ears looking ok today.    Follow up plan: Return if symptoms worsen or fail to improve.  Ria Bush, MD

## 2019-07-18 NOTE — Patient Instructions (Signed)
Increase amount of neosporin you're using to help skin continue to heal. Take keflex antibiotic three times daily for the next 5 days.  Let us know if any streaking redness or draining develops.  Ears looking ok today.

## 2019-07-18 NOTE — Assessment & Plan Note (Signed)
Poorly healing wound anticipate due to overly dry wound. Recommend increase amt of neosporin each dressing change, recommended covering wound after applying neosporin. Will Rx short keflex course to cover any possible infection contributing to slow wound healing. Home care instructions provided.

## 2019-07-27 ENCOUNTER — Other Ambulatory Visit: Payer: Self-pay

## 2019-08-04 NOTE — Pre-Procedure Instructions (Signed)
[facility] - [address], [id number], [place] [address] Phone: [phone] Fax: [phone]  [id number] - [place], [place] [address] Phone: [phone] Fax: [phone]     Your procedure is scheduled on Tuesday February 9th.  Report to [facility] Main Entrance "A" at 9:00 A.M., and check in at the Admitting office.  Call this number if you have problems the morning of surgery:  [phone]  Call [phone] if you have any questions prior to your surgery date Monday-Friday 8am-4pm    Remember:  Do not eat or drink after midnight the night before your surgery  You may drink clear liquids until 8:00 the morning of your surgery.   Clear liquids allowed are: Water, Non-Citrus Juices (without pulp), Carbonated Beverages, Clear Tea, Black Coffee Only, and Gatorade Patient Instructions  . The night before surgery:  o No food after midnight. ONLY clear liquids after midnight  . The day of surgery (if you do NOT have diabetes):  o Drink ONE (1) Pre-Surgery Clear Ensure as directed.   o This drink was given to you during your hospital  pre-op appointment visit. o The pre-op nurse will instruct you on the time to drink the  Pre-Surgery Ensure depending on your surgery time- by 8:00 AM o Finish the drink at the designated time by the pre-op nurse.  o Nothing else to drink after completing the         If you have questions, please contact your surgeon's office.     Take these medicines the morning of surgery with A SIP OF WATER  omeprazole (PRILOSEC)  SYNTHROID   As of today, STOP taking any Aspirin (unless otherwise instructed by your surgeon), Aleve, Naproxen, Ibuprofen, Motrin, Advil, Goody's, BC's, all herbal medications, fish oil, and all vitamins.    The Morning of Surgery  Do not wear jewelry, make-up or nail polish.  Do not wear lotions, powders,  or perfumes/colognes, or deodorant  Do not shave 48 hours prior to surgery.  Men may shave face and neck.  Do not bring valuables to the hospital.  [facility] is not responsible for any belongings or valuables.  If you are a smoker, DO NOT Smoke 24 hours prior to surgery  If you wear a CPAP at night please bring your mask the morning of surgery   Remember that you must have someone to transport you home after your surgery, and remain with you for 24 hours if you are discharged the same day.   Please bring cases for contacts, glasses, hearing aids, dentures or bridgework because it cannot be worn into surgery.    Leave your suitcase in the car.  After surgery it may be brought to your room.  For patients admitted to the hospital, discharge time will be determined by your treatment team.  Patients discharged the day of surgery will not be allowed to drive home.    Special instructions:   [facility]- Preparing For Surgery  Before surgery, you can play an important role. Because skin is not sterile, your skin needs to be as free of germs as possible. You can reduce the number of germs on your skin by washing with CHG (chlorahexidine gluconate) Soap before surgery.  CHG is an antiseptic cleaner which kills germs and bonds with the skin to continue killing germs even after washing.    Oral Hygiene is also important  to reduce your risk of infection.  Remember - BRUSH YOUR TEETH THE MORNING OF SURGERY WITH YOUR REGULAR TOOTHPASTE  Please do not use if you have an allergy to CHG or antibacterial soaps. If your skin becomes reddened/irritated stop using the CHG.  Do not shave (including legs and underarms) for at least 48 hours prior to first CHG shower. It is OK to shave your face.  Please follow these instructions carefully.   1. Shower the NIGHT BEFORE SURGERY and the MORNING OF SURGERY with CHG Soap.   2. If you chose to wash your hair, wash your hair first as usual with your normal  shampoo.  3. After you shampoo, rinse your hair and body thoroughly to remove the shampoo.  4. Use CHG as you would any other liquid soap. You can apply CHG directly to the skin and wash gently with a scrungie or a clean washcloth.   5. Apply the CHG Soap to your body ONLY FROM THE NECK DOWN.  Do not use on open wounds or open sores. Avoid contact with your eyes, ears, mouth and genitals (private parts). Wash Face and genitals (private parts)  with your normal soap.   6. Wash thoroughly, paying special attention to the area where your surgery will be performed.  7. Thoroughly rinse your body with warm water from the neck down.  8. DO NOT shower/wash with your normal soap after using and rinsing off the CHG Soap.  9. Pat yourself dry with a CLEAN TOWEL.  10. Wear CLEAN PAJAMAS to bed the night before surgery, wear comfortable clothes the morning of surgery  11. Place CLEAN SHEETS on your bed the night of your first shower and DO NOT SLEEP WITH PETS.    Day of Surgery:  Please shower the morning of surgery with the CHG soap Do not apply any deodorants/lotions. Please wear clean clothes to the hospital/surgery center.   Remember to brush your teeth WITH YOUR REGULAR TOOTHPASTE.   Please read over the following fact sheets that you were given.

## 2019-08-05 ENCOUNTER — Encounter (HOSPITAL_COMMUNITY)
Admission: RE | Admit: 2019-08-05 | Discharge: 2019-08-05 | Disposition: A | Discharge status: Home / Self Care | Payer: BC Managed Care – PPO | Source: Ambulatory Visit | Attending: Orthopedic Surgery | Admitting: Orthopedic Surgery

## 2019-08-05 ENCOUNTER — Other Ambulatory Visit (HOSPITAL_COMMUNITY)
Admission: RE | Admit: 2019-08-05 | Discharge: 2019-08-05 | Disposition: A | Discharge status: Home / Self Care | Payer: BC Managed Care – PPO | Source: Ambulatory Visit | Attending: Orthopedic Surgery | Admitting: Orthopedic Surgery

## 2019-08-05 ENCOUNTER — Other Ambulatory Visit: Payer: Self-pay

## 2019-08-05 ENCOUNTER — Encounter (HOSPITAL_COMMUNITY): Payer: Self-pay

## 2019-08-05 DIAGNOSIS — Z20822 Contact with and (suspected) exposure to covid-19: Secondary | ICD-10-CM | POA: Diagnosis not present

## 2019-08-05 DIAGNOSIS — Z01818 Encounter for other preprocedural examination: Secondary | ICD-10-CM | POA: Insufficient documentation

## 2019-08-05 DIAGNOSIS — I2510 Atherosclerotic heart disease of native coronary artery without angina pectoris: Secondary | ICD-10-CM | POA: Diagnosis not present

## 2019-08-05 HISTORY — DX: Unspecified osteoarthritis, unspecified site: M19.90

## 2019-08-05 LAB — URINALYSIS, ROUTINE W REFLEX MICROSCOPIC
Bilirubin Urine: NEGATIVE
Glucose, UA: NEGATIVE mg/dL
Hgb urine dipstick: NEGATIVE
Ketones, ur: NEGATIVE mg/dL
Leukocytes,Ua: NEGATIVE
Nitrite: NEGATIVE
Protein, ur: NEGATIVE mg/dL
Specific Gravity, Urine: 1.004 — ABNORMAL LOW (ref 1.005–1.030)
pH: 7 (ref 5.0–8.0)

## 2019-08-05 LAB — BASIC METABOLIC PANEL
Anion gap: 11 (ref 5–15)
BUN: 13 mg/dL (ref 8–23)
CO2: 23 mmol/L (ref 22–32)
Calcium: 9.1 mg/dL (ref 8.9–10.3)
Chloride: 106 mmol/L (ref 98–111)
Creatinine, Ser: 1.35 mg/dL — ABNORMAL HIGH (ref 0.61–1.24)
GFR calc Af Amer: >60 mL/min (ref >60)
GFR calc non Af Amer: 54 mL/min — ABNORMAL LOW (ref >60)
Glucose, Bld: 92 mg/dL (ref 70–99)
Potassium: 4.3 mmol/L (ref 3.5–5.1)
Sodium: 140 mmol/L (ref 135–145)

## 2019-08-05 LAB — CBC
HCT: 46.4 % (ref 39.0–52.0)
Hemoglobin: 14.8 g/dL (ref 13.0–17.0)
MCH: 31.1 pg (ref 26.0–34.0)
MCHC: 31.9 g/dL (ref 30.0–36.0)
MCV: 97.5 fL (ref 80.0–100.0)
Platelets: 275 10*3/uL (ref 150–400)
RBC: 4.76 MIL/uL (ref 4.22–5.81)
RDW: 13.9 % (ref 11.5–15.5)
WBC: 6.7 10*3/uL (ref 4.0–10.5)
nRBC: 0 % (ref 0.0–0.2)

## 2019-08-05 LAB — SURGICAL PCR SCREEN
MRSA, PCR: NEGATIVE
Staphylococcus aureus: NEGATIVE

## 2019-08-05 LAB — SARS CORONAVIRUS 2 (TAT 6-24 HRS): SARS Coronavirus 2: NEGATIVE

## 2019-08-05 NOTE — Progress Notes (Signed)
PCP - Dr. Philip Aspen Cardiologist - Tressia Miners [facility]  Chest x-ray - N/A EKG - 02/10/19 Stress Test - 08/28/17 ECHO - denies Cardiac Cath - denies  Sleep Study - denies   Aspirin Instructions :Patient instructed to hold all Aspirin, NSAID's, herbal medications, fish oil and vitamins 7 days prior to surgery.  COVID test- 08/05/19 at [facility]. Results pending  Anesthesia review: documented h/o CAD  Patient denies shortness of breath, fever, cough and chest pain at PAT appointment   Patient verbalized understanding of instructions that were given to them at the PAT appointment. Patient was also instructed that they will need to review over the PAT instructions again at home before surgery.

## 2019-08-06 LAB — URINE CULTURE: Culture: NO GROWTH

## 2019-08-08 ENCOUNTER — Other Ambulatory Visit: Payer: Self-pay | Admitting: Surgical

## 2019-08-08 MED ORDER — CELECOXIB 200 MG PO CAPS: 200.0000 mg | ORAL_CAPSULE | Freq: Two times a day (BID) | ORAL | 0 refills | Status: DC

## 2019-08-08 NOTE — Progress Notes (Signed)
Anesthesia Chart Review:  Follows with cardiology for hx of coronary calcifications seen on CT and risk factor modification. Last seen by Dr. Radford Pax 02/10/19. Per note, "Coronary artery calcification. This was noted on prior Chest CT. Nuclear stress test a year ago showed no ischemia. He has no anginal sx." Recommended continue risk factor modification.  Preop labs reviewed. Creatinine mildly elevated at 1.35. Otherwise unremarkable.   EKG 8/13/2: Sinus bradycardia. Rate 56.   Nuclear stress 08/28/17:  Nuclear stress EF: 61%.  Blood pressure demonstrated a hypertensive response to exercise.  ST segment depression was noted during stress.  The study is normal.  This is a low risk study.  The left ventricular ejection fraction is normal (55-65%).   Low risk stress nuclear study with no ischemia or infarction; note positive ST changes with exercise; EF 61 with normal wall motion.   Wynonia Musty [facility] Short Stay Center/Anesthesiology Phone [phone] 08/08/2019 9:15 AM

## 2019-08-08 NOTE — Anesthesia Preprocedure Evaluation (Addendum)
Anesthesia Evaluation  Patient identified by MRN, date of birth, ID band Patient awake    Reviewed: Allergy & Precautions, NPO status , Patient's Chart, lab work & pertinent test results  Airway Mallampati: II  TM Distance: >3 FB Neck ROM: Full    Dental no notable dental hx. (+) Caps, Dental Advisory Given   Pulmonary neg pulmonary ROS,    Pulmonary exam normal breath sounds clear to auscultation       Cardiovascular Normal cardiovascular exam Rhythm:Regular Rate:Normal     Neuro/Psych negative neurological ROS  negative psych ROS   GI/Hepatic GERD  Medicated and Controlled,  Endo/Other  Hypothyroidism   Renal/GU negative Renal ROS     Musculoskeletal   Abdominal   Peds  Hematology   Anesthesia Other Findings   Reproductive/Obstetrics                            Anesthesia Physical Anesthesia Plan  ASA: II  Anesthesia Plan: Spinal   Post-op Pain Management:  Regional for Post-op pain   Induction:   PONV Risk Score and Plan: Dexamethasone, Ondansetron and Midazolam  Airway Management Planned: Natural Airway and Simple Face Mask  Additional Equipment:   Intra-op Plan:   Post-operative Plan:   Informed Consent: I have reviewed the patients History and Physical, chart, labs and discussed the procedure including the risks, benefits and alternatives for the proposed anesthesia with the patient or authorized representative who has indicated his/her understanding and acceptance.     Dental advisory given  Plan Discussed with: CRNA, Anesthesiologist and Surgeon  Anesthesia Plan Comments: (Follows with cardiology for hx of coronary calcifications seen on CT and risk factor modification. Last seen by Dr. Radford Pax 02/10/19. Per note, "Coronary artery calcification. This was noted on prior Chest CT. Nuclear stress test a year ago showed no ischemia. He has no anginal sx." Recommended  continue risk factor modification.  Preop labs reviewed. Creatinine mildly elevated at 1.35. Otherwise unremarkable.   EKG 8/13/2: Sinus bradycardia. Rate 56.   Nuclear stress 08/28/17: Nuclear stress EF: 61%. Blood pressure demonstrated a hypertensive response to exercise. ST segment depression was noted during stress. The study is normal. This is a low risk study. The left ventricular ejection fraction is normal (55-65%).   Low risk stress nuclear study with no ischemia or infarction; note positive ST changes with exercise; EF 61 with normal wall motion. )       Anesthesia Quick Evaluation

## 2019-08-09 ENCOUNTER — Observation Stay (HOSPITAL_COMMUNITY)
Admission: RE | Admit: 2019-08-09 | Discharge: 2019-08-10 | Disposition: A | Discharge status: Home / Self Care | Payer: BC Managed Care – PPO | Attending: Orthopedic Surgery | Admitting: Orthopedic Surgery

## 2019-08-09 ENCOUNTER — Encounter (HOSPITAL_COMMUNITY): Payer: Self-pay | Admitting: Orthopedic Surgery

## 2019-08-09 ENCOUNTER — Ambulatory Visit (HOSPITAL_COMMUNITY): Payer: BC Managed Care – PPO | Admitting: Anesthesiology

## 2019-08-09 ENCOUNTER — Encounter (HOSPITAL_COMMUNITY)
Admission: RE | Disposition: A | Discharge status: Home / Self Care | Payer: Self-pay | Source: Home / Self Care | Attending: Orthopedic Surgery

## 2019-08-09 ENCOUNTER — Other Ambulatory Visit: Payer: Self-pay

## 2019-08-09 ENCOUNTER — Ambulatory Visit (HOSPITAL_COMMUNITY): Payer: BC Managed Care – PPO | Admitting: Physician Assistant

## 2019-08-09 DIAGNOSIS — Z791 Long term (current) use of non-steroidal anti-inflammatories (NSAID): Secondary | ICD-10-CM | POA: Insufficient documentation

## 2019-08-09 DIAGNOSIS — I2510 Atherosclerotic heart disease of native coronary artery without angina pectoris: Secondary | ICD-10-CM | POA: Insufficient documentation

## 2019-08-09 DIAGNOSIS — Z79899 Other long term (current) drug therapy: Secondary | ICD-10-CM | POA: Insufficient documentation

## 2019-08-09 DIAGNOSIS — Z7982 Long term (current) use of aspirin: Secondary | ICD-10-CM | POA: Insufficient documentation

## 2019-08-09 DIAGNOSIS — Z79890 Hormone replacement therapy (postmenopausal): Secondary | ICD-10-CM | POA: Insufficient documentation

## 2019-08-09 DIAGNOSIS — K219 Gastro-esophageal reflux disease without esophagitis: Secondary | ICD-10-CM | POA: Insufficient documentation

## 2019-08-09 DIAGNOSIS — I700 Atherosclerosis of aorta: Secondary | ICD-10-CM | POA: Insufficient documentation

## 2019-08-09 DIAGNOSIS — Z6832 Body mass index (BMI) 32.0-32.9, adult: Secondary | ICD-10-CM | POA: Insufficient documentation

## 2019-08-09 DIAGNOSIS — E669 Obesity, unspecified: Secondary | ICD-10-CM | POA: Insufficient documentation

## 2019-08-09 DIAGNOSIS — E039 Hypothyroidism, unspecified: Secondary | ICD-10-CM | POA: Diagnosis not present

## 2019-08-09 DIAGNOSIS — M1711 Unilateral primary osteoarthritis, right knee: Principal | ICD-10-CM | POA: Diagnosis present

## 2019-08-09 DIAGNOSIS — E785 Hyperlipidemia, unspecified: Secondary | ICD-10-CM | POA: Insufficient documentation

## 2019-08-09 DIAGNOSIS — G8918 Other acute postprocedural pain: Secondary | ICD-10-CM | POA: Diagnosis not present

## 2019-08-09 HISTORY — PX: TOTAL KNEE ARTHROPLASTY: SHX125

## 2019-08-09 SURGERY — ARTHROPLASTY, KNEE, TOTAL
Anesthesia: Spinal | Site: Knee | Laterality: Right

## 2019-08-09 MED ORDER — PROPOFOL 10 MG/ML IV BOLUS
INTRAVENOUS | Status: DC | PRN
  Administered 2019-08-09: 30 mg via INTRAVENOUS
  Administered 2019-08-09: 20 mg via INTRAVENOUS

## 2019-08-09 MED ORDER — SODIUM CHLORIDE 0.9 % IR SOLN
Status: DC | PRN
  Administered 2019-08-09: 3000 mL

## 2019-08-09 MED ORDER — VANCOMYCIN HCL 1000 MG IV SOLR
INTRAVENOUS | Status: DC | PRN
  Administered 2019-08-09: 1000 mg

## 2019-08-09 MED ORDER — CLONIDINE HCL (ANALGESIA) 100 MCG/ML EP SOLN
EPIDURAL | Status: DC | PRN
  Administered 2019-08-09: 1 mL

## 2019-08-09 MED ORDER — TRANEXAMIC ACID 1000 MG/10ML IV SOLN
2000.0000 mg | INTRAVENOUS | Status: AC
  Administered 2019-08-09: 12:00:00 2000 mg via TOPICAL
  Filled 2019-08-09: qty 20

## 2019-08-09 MED ORDER — DOCUSATE SODIUM 100 MG PO CAPS
100.0000 mg | ORAL_CAPSULE | Freq: Two times a day (BID) | ORAL | Status: DC
  Administered 2019-08-09 – 2019-08-10 (×2): 100 mg via ORAL
  Filled 2019-08-09 (×2): qty 1

## 2019-08-09 MED ORDER — FENTANYL CITRATE (PF) 100 MCG/2ML IJ SOLN
INTRAMUSCULAR | Status: AC
  Filled 2019-08-09: qty 2

## 2019-08-09 MED ORDER — ONDANSETRON HCL 4 MG/2ML IJ SOLN: 4.0000 mg | Freq: Four times a day (QID) | INTRAMUSCULAR | Status: DC | PRN

## 2019-08-09 MED ORDER — OXYCODONE HCL 5 MG PO TABS: 5.0000 mg | ORAL_TABLET | Freq: Once | ORAL | Status: DC | PRN

## 2019-08-09 MED ORDER — LIDOCAINE 2% (20 MG/ML) 5 ML SYRINGE
INTRAMUSCULAR | Status: AC
  Filled 2019-08-09: qty 5

## 2019-08-09 MED ORDER — ATORVASTATIN CALCIUM 80 MG PO TABS
80.0000 mg | ORAL_TABLET | Freq: Every day | ORAL | Status: DC
  Administered 2019-08-10: 80 mg via ORAL
  Filled 2019-08-09: qty 1

## 2019-08-09 MED ORDER — ONDANSETRON HCL 4 MG PO TABS: 4.0000 mg | ORAL_TABLET | Freq: Four times a day (QID) | ORAL | Status: DC | PRN

## 2019-08-09 MED ORDER — CHLORHEXIDINE GLUCONATE 4 % EX LIQD: 60.0000 mL | Freq: Once | CUTANEOUS | Status: DC

## 2019-08-09 MED ORDER — POVIDONE-IODINE 10 % EX SWAB: 2.0000 "application " | Freq: Once | CUTANEOUS | Status: DC

## 2019-08-09 MED ORDER — CELECOXIB 200 MG PO CAPS
200.0000 mg | ORAL_CAPSULE | Freq: Two times a day (BID) | ORAL | Status: DC
  Administered 2019-08-09 – 2019-08-10 (×2): 200 mg via ORAL
  Filled 2019-08-09 (×2): qty 1

## 2019-08-09 MED ORDER — EZETIMIBE 10 MG PO TABS
10.0000 mg | ORAL_TABLET | Freq: Every day | ORAL | Status: DC
  Administered 2019-08-10: 10 mg via ORAL
  Filled 2019-08-09: qty 1

## 2019-08-09 MED ORDER — TRANEXAMIC ACID-NACL 1000-0.7 MG/100ML-% IV SOLN
1000.0000 mg | INTRAVENOUS | Status: AC
  Administered 2019-08-09: 1000 mg via INTRAVENOUS
  Filled 2019-08-09: qty 100

## 2019-08-09 MED ORDER — VANCOMYCIN HCL 1000 MG IV SOLR
INTRAVENOUS | Status: AC
  Filled 2019-08-09: qty 1000

## 2019-08-09 MED ORDER — PHENOL 1.4 % MT LIQD: 1.0000 | OROMUCOSAL | Status: DC | PRN

## 2019-08-09 MED ORDER — BUPIVACAINE LIPOSOME 1.3 % IJ SUSP
INTRAMUSCULAR | Status: DC | PRN
  Administered 2019-08-09: 20 mL

## 2019-08-09 MED ORDER — PANTOPRAZOLE SODIUM 40 MG PO TBEC
40.0000 mg | DELAYED_RELEASE_TABLET | Freq: Every day | ORAL | Status: DC
  Administered 2019-08-10: 40 mg via ORAL
  Filled 2019-08-09: qty 1

## 2019-08-09 MED ORDER — METHOCARBAMOL 1000 MG/10ML IJ SOLN
500.0000 mg | Freq: Four times a day (QID) | INTRAVENOUS | Status: DC | PRN
  Filled 2019-08-09: qty 5

## 2019-08-09 MED ORDER — ONDANSETRON HCL 4 MG/2ML IJ SOLN
INTRAMUSCULAR | Status: AC
  Filled 2019-08-09: qty 2

## 2019-08-09 MED ORDER — PHENYLEPHRINE HCL-NACL 10-0.9 MG/250ML-% IV SOLN
INTRAVENOUS | Status: DC | PRN
  Administered 2019-08-09: 40 ug/min via INTRAVENOUS

## 2019-08-09 MED ORDER — ONDANSETRON HCL 4 MG/2ML IJ SOLN: 4.0000 mg | Freq: Once | INTRAMUSCULAR | Status: DC | PRN

## 2019-08-09 MED ORDER — GABAPENTIN 300 MG PO CAPS
300.0000 mg | ORAL_CAPSULE | Freq: Three times a day (TID) | ORAL | Status: DC
  Administered 2019-08-09 – 2019-08-10 (×3): 300 mg via ORAL
  Filled 2019-08-09 (×3): qty 1

## 2019-08-09 MED ORDER — FENTANYL CITRATE (PF) 250 MCG/5ML IJ SOLN
INTRAMUSCULAR | Status: AC
  Filled 2019-08-09: qty 5

## 2019-08-09 MED ORDER — BUPIVACAINE HCL (PF) 0.25 % IJ SOLN
INTRAMUSCULAR | Status: AC
  Filled 2019-08-09: qty 30

## 2019-08-09 MED ORDER — ACETAMINOPHEN 160 MG/5ML PO SOLN: 325.0000 mg | ORAL | Status: DC | PRN

## 2019-08-09 MED ORDER — DEXAMETHASONE SODIUM PHOSPHATE 10 MG/ML IJ SOLN
INTRAMUSCULAR | Status: DC | PRN
  Administered 2019-08-09: 10 mg

## 2019-08-09 MED ORDER — METHOCARBAMOL 500 MG PO TABS
500.0000 mg | ORAL_TABLET | Freq: Four times a day (QID) | ORAL | Status: DC | PRN
  Administered 2019-08-09 – 2019-08-10 (×2): 500 mg via ORAL
  Filled 2019-08-09 (×2): qty 1

## 2019-08-09 MED ORDER — ONDANSETRON HCL 4 MG/2ML IJ SOLN
INTRAMUSCULAR | Status: DC | PRN
  Administered 2019-08-09: 4 mg via INTRAVENOUS

## 2019-08-09 MED ORDER — OXYCODONE HCL 5 MG/5ML PO SOLN: 5.0000 mg | Freq: Once | ORAL | Status: DC | PRN

## 2019-08-09 MED ORDER — METOCLOPRAMIDE HCL 5 MG PO TABS: 5.0000 mg | ORAL_TABLET | Freq: Three times a day (TID) | ORAL | Status: DC | PRN

## 2019-08-09 MED ORDER — FENTANYL CITRATE (PF) 100 MCG/2ML IJ SOLN
100.0000 ug | Freq: Once | INTRAMUSCULAR | Status: AC
  Administered 2019-08-09: 100 ug via INTRAVENOUS

## 2019-08-09 MED ORDER — LEVOTHYROXINE SODIUM 100 MCG PO TABS
100.0000 ug | ORAL_TABLET | Freq: Every day | ORAL | Status: DC
  Administered 2019-08-10: 100 ug via ORAL
  Filled 2019-08-09: qty 1

## 2019-08-09 MED ORDER — 0.9 % SODIUM CHLORIDE (POUR BTL) OPTIME
TOPICAL | Status: DC | PRN
  Administered 2019-08-09: 1000 mL
  Administered 2019-08-09: 2000 mL

## 2019-08-09 MED ORDER — ROPIVACAINE HCL 7.5 MG/ML IJ SOLN
INTRAMUSCULAR | Status: DC | PRN
  Administered 2019-08-09: 25 mL via PERINEURAL

## 2019-08-09 MED ORDER — BUPIVACAINE HCL 0.25 % IJ SOLN
INTRAMUSCULAR | Status: DC | PRN
  Administered 2019-08-09: 10 mL
  Administered 2019-08-09: 20 mL

## 2019-08-09 MED ORDER — LACTATED RINGERS IV SOLN: INTRAVENOUS | Status: DC

## 2019-08-09 MED ORDER — FENTANYL CITRATE (PF) 100 MCG/2ML IJ SOLN: 25.0000 ug | INTRAMUSCULAR | Status: DC | PRN

## 2019-08-09 MED ORDER — DEXAMETHASONE SODIUM PHOSPHATE 10 MG/ML IJ SOLN
INTRAMUSCULAR | Status: AC
  Filled 2019-08-09: qty 1

## 2019-08-09 MED ORDER — DEXAMETHASONE SODIUM PHOSPHATE 10 MG/ML IJ SOLN
INTRAMUSCULAR | Status: DC | PRN
  Administered 2019-08-09: 10 mg via INTRAVENOUS

## 2019-08-09 MED ORDER — OXYCODONE HCL 5 MG PO TABS
5.0000 mg | ORAL_TABLET | ORAL | Status: DC | PRN
  Administered 2019-08-09: 10 mg via ORAL
  Administered 2019-08-09: 5 mg via ORAL
  Administered 2019-08-10 (×4): 10 mg via ORAL
  Filled 2019-08-09 (×2): qty 2
  Filled 2019-08-09: qty 1
  Filled 2019-08-09 (×3): qty 2

## 2019-08-09 MED ORDER — CEFAZOLIN SODIUM-DEXTROSE 2-4 GM/100ML-% IV SOLN
2.0000 g | Freq: Four times a day (QID) | INTRAVENOUS | Status: AC
  Administered 2019-08-09 (×2): 2 g via INTRAVENOUS
  Filled 2019-08-09 (×2): qty 100

## 2019-08-09 MED ORDER — IRRISEPT - 450ML BOTTLE WITH 0.05% CHG IN STERILE WATER, USP 99.95% OPTIME
TOPICAL | Status: DC | PRN
  Administered 2019-08-09: 450 mL

## 2019-08-09 MED ORDER — CEFAZOLIN SODIUM-DEXTROSE 2-4 GM/100ML-% IV SOLN
2.0000 g | INTRAVENOUS | Status: AC
  Administered 2019-08-09: 2 g via INTRAVENOUS
  Filled 2019-08-09: qty 100

## 2019-08-09 MED ORDER — HYDROMORPHONE HCL 1 MG/ML IJ SOLN: 0.5000 mg | INTRAMUSCULAR | Status: DC | PRN

## 2019-08-09 MED ORDER — METOCLOPRAMIDE HCL 5 MG/ML IJ SOLN: 5.0000 mg | Freq: Three times a day (TID) | INTRAMUSCULAR | Status: DC | PRN

## 2019-08-09 MED ORDER — ACETAMINOPHEN 325 MG PO TABS: 325.0000 mg | ORAL_TABLET | ORAL | Status: DC | PRN

## 2019-08-09 MED ORDER — MIDAZOLAM HCL 2 MG/2ML IJ SOLN
2.0000 mg | Freq: Once | INTRAMUSCULAR | Status: AC
  Administered 2019-08-09: 2 mg via INTRAVENOUS

## 2019-08-09 MED ORDER — ACETAMINOPHEN 325 MG PO TABS: 325.0000 mg | ORAL_TABLET | Freq: Four times a day (QID) | ORAL | Status: DC | PRN

## 2019-08-09 MED ORDER — BUPIVACAINE IN DEXTROSE 0.75-8.25 % IT SOLN
INTRATHECAL | Status: DC | PRN
  Administered 2019-08-09: 2 mL via INTRATHECAL

## 2019-08-09 MED ORDER — MORPHINE SULFATE (PF) 4 MG/ML IV SOLN
INTRAVENOUS | Status: DC | PRN
  Administered 2019-08-09: 8 mg

## 2019-08-09 MED ORDER — PROPOFOL 500 MG/50ML IV EMUL
INTRAVENOUS | Status: DC | PRN
  Administered 2019-08-09: 50 ug/kg/min via INTRAVENOUS
  Administered 2019-08-09: 100 ug/kg/min via INTRAVENOUS

## 2019-08-09 MED ORDER — ASPIRIN 81 MG PO CHEW
81.0000 mg | CHEWABLE_TABLET | Freq: Two times a day (BID) | ORAL | Status: DC
  Administered 2019-08-09 – 2019-08-10 (×2): 81 mg via ORAL
  Filled 2019-08-09 (×2): qty 1

## 2019-08-09 MED ORDER — SODIUM CHLORIDE 0.9% FLUSH
INTRAVENOUS | Status: DC | PRN
  Administered 2019-08-09: 20 mL

## 2019-08-09 MED ORDER — MORPHINE SULFATE (PF) 4 MG/ML IV SOLN
INTRAVENOUS | Status: AC
  Filled 2019-08-09: qty 2

## 2019-08-09 MED ORDER — MEPERIDINE HCL 25 MG/ML IJ SOLN: 6.2500 mg | INTRAMUSCULAR | Status: DC | PRN

## 2019-08-09 MED ORDER — MENTHOL 3 MG MT LOZG: 1.0000 | LOZENGE | OROMUCOSAL | Status: DC | PRN

## 2019-08-09 MED ORDER — MIDAZOLAM HCL 2 MG/2ML IJ SOLN
INTRAMUSCULAR | Status: AC
  Filled 2019-08-09: qty 2

## 2019-08-09 SURGICAL SUPPLY — 77 items
BAG DECANTER FOR FLEXI CONT (MISCELLANEOUS) ×2 IMPLANT
BANDAGE ESMARK 6X9 LF (GAUZE/BANDAGES/DRESSINGS) ×1 IMPLANT
BLADE SAG 18X100X1.27 (BLADE) ×2 IMPLANT
BNDG COHESIVE 6X5 TAN STRL LF (GAUZE/BANDAGES/DRESSINGS) ×2 IMPLANT
BNDG ELASTIC 6X10 VLCR STRL LF (GAUZE/BANDAGES/DRESSINGS) ×2 IMPLANT
BNDG ELASTIC 6X15 VLCR STRL LF (GAUZE/BANDAGES/DRESSINGS) ×2 IMPLANT
BNDG ESMARK 6X9 LF (GAUZE/BANDAGES/DRESSINGS) ×2
CLOSURE STERI-STRIP 1/4X4 (GAUZE/BANDAGES/DRESSINGS) ×2 IMPLANT
CNTNR URN SCR LID CUP LEK RST (MISCELLANEOUS) ×1 IMPLANT
COMPONENT TRI CR FEM SZ5 KNEE (Orthopedic Implant) ×1 IMPLANT
CONT SPEC 4OZ STRL OR WHT (MISCELLANEOUS) ×1
COVER SURGICAL LIGHT HANDLE (MISCELLANEOUS) ×2 IMPLANT
COVER WAND RF STERILE (DRAPES) ×2 IMPLANT
CUFF TOURN SGL QUICK 34 (TOURNIQUET CUFF) ×1
CUFF TRNQT CYL 34X4.125X (TOURNIQUET CUFF) ×1 IMPLANT
DECANTER SPIKE VIAL GLASS SM (MISCELLANEOUS) ×2 IMPLANT
DRAPE INCISE IOBAN 66X45 STRL (DRAPES) IMPLANT
DRAPE ORTHO SPLIT 77X108 STRL (DRAPES) ×3
DRAPE SURG ORHT 6 SPLT 77X108 (DRAPES) ×3 IMPLANT
DRAPE U-SHAPE 47X51 STRL (DRAPES) ×2 IMPLANT
DRSG AQUACEL AG ADV 3.5X10 (GAUZE/BANDAGES/DRESSINGS) ×2 IMPLANT
DRSG AQUACEL AG ADV 3.5X14 (GAUZE/BANDAGES/DRESSINGS) IMPLANT
DURAPREP 26ML APPLICATOR (WOUND CARE) ×4 IMPLANT
ELECT CAUTERY BLADE 6.4 (BLADE) ×2 IMPLANT
ELECT REM PT RETURN 9FT ADLT (ELECTROSURGICAL) ×2
ELECTRODE REM PT RTRN 9FT ADLT (ELECTROSURGICAL) ×1 IMPLANT
GAUZE SPONGE 4X4 12PLY STRL (GAUZE/BANDAGES/DRESSINGS) ×2 IMPLANT
GLOVE BIOGEL PI IND STRL 7.0 (GLOVE) ×1 IMPLANT
GLOVE BIOGEL PI IND STRL 8 (GLOVE) ×1 IMPLANT
GLOVE BIOGEL PI INDICATOR 7.0 (GLOVE) ×1
GLOVE BIOGEL PI INDICATOR 8 (GLOVE) ×1
GLOVE ECLIPSE 7.0 STRL STRAW (GLOVE) ×2 IMPLANT
GLOVE ECLIPSE 8.0 STRL XLNG CF (GLOVE) ×2 IMPLANT
GOWN STRL REUS W/ TWL LRG LVL3 (GOWN DISPOSABLE) ×3 IMPLANT
GOWN STRL REUS W/TWL LRG LVL3 (GOWN DISPOSABLE) ×3
HANDPIECE INTERPULSE COAX TIP (DISPOSABLE) ×1
HOOD PEEL AWAY FLYTE STAYCOOL (MISCELLANEOUS) ×6 IMPLANT
IMMOBILIZER KNEE 20 (SOFTGOODS)
IMMOBILIZER KNEE 20 THIGH 36 (SOFTGOODS) IMPLANT
IMMOBILIZER KNEE 22 UNIV (SOFTGOODS) ×2 IMPLANT
IMMOBILIZER KNEE 24 THIGH 36 (MISCELLANEOUS) IMPLANT
IMMOBILIZER KNEE 24 UNIV (MISCELLANEOUS)
INSERT KNEE TIB BRG 6 (Insert) ×2 IMPLANT
JET LAVAGE IRRISEPT WOUND (IRRIGATION / IRRIGATOR) ×2
KIT BASIN OR (CUSTOM PROCEDURE TRAY) ×2 IMPLANT
KIT TURNOVER KIT B (KITS) ×2 IMPLANT
KNEE PATELLA ASYMMETRIC 10X35 (Knees) ×2 IMPLANT
KNEE TIBIAL COMPONENT SZ6 (Knees) ×2 IMPLANT
LAVAGE JET IRRISEPT WOUND (IRRIGATION / IRRIGATOR) ×1 IMPLANT
MANIFOLD NEPTUNE II (INSTRUMENTS) ×2 IMPLANT
NEEDLE 22X1 1/2 (OR ONLY) (NEEDLE) ×4 IMPLANT
NEEDLE SPNL 18GX3.5 QUINCKE PK (NEEDLE) ×2 IMPLANT
NS IRRIG 1000ML POUR BTL (IV SOLUTION) ×6 IMPLANT
PACK TOTAL JOINT (CUSTOM PROCEDURE TRAY) ×2 IMPLANT
PAD ARMBOARD 7.5X6 YLW CONV (MISCELLANEOUS) ×4 IMPLANT
PAD CAST 4YDX4 CTTN HI CHSV (CAST SUPPLIES) ×1 IMPLANT
PADDING CAST COTTON 4X4 STRL (CAST SUPPLIES) ×1
PADDING CAST COTTON 6X4 STRL (CAST SUPPLIES) ×2 IMPLANT
PIN FLUTED HEDLESS FIX 3.5X1/8 (PIN) ×2 IMPLANT
SET HNDPC FAN SPRY TIP SCT (DISPOSABLE) ×1 IMPLANT
STRIP CLOSURE SKIN 1/2X4 (GAUZE/BANDAGES/DRESSINGS) ×4 IMPLANT
SUCTION FRAZIER HANDLE 10FR (MISCELLANEOUS) ×1
SUCTION TUBE FRAZIER 10FR DISP (MISCELLANEOUS) ×1 IMPLANT
SUT MNCRL AB 3-0 PS2 18 (SUTURE) ×2 IMPLANT
SUT VIC AB 0 CT1 27 (SUTURE) ×3
SUT VIC AB 0 CT1 27XBRD ANBCTR (SUTURE) ×3 IMPLANT
SUT VIC AB 1 CT1 27 (SUTURE) ×5
SUT VIC AB 1 CT1 27XBRD ANBCTR (SUTURE) ×5 IMPLANT
SUT VIC AB 2-0 CT1 27 (SUTURE) ×4
SUT VIC AB 2-0 CT1 TAPERPNT 27 (SUTURE) ×4 IMPLANT
SYR 30ML LL (SYRINGE) ×6 IMPLANT
SYR TB 1ML LUER SLIP (SYRINGE) ×2 IMPLANT
TOWEL GREEN STERILE (TOWEL DISPOSABLE) ×4 IMPLANT
TOWEL GREEN STERILE FF (TOWEL DISPOSABLE) ×4 IMPLANT
TRAY CATH 16FR W/PLASTIC CATH (SET/KITS/TRAYS/PACK) ×2 IMPLANT
TRI CRUC RET FEM SZ5 KNEE (Orthopedic Implant) ×2 IMPLANT
WATER STERILE IRR 1000ML POUR (IV SOLUTION) IMPLANT

## 2019-08-09 NOTE — Op Note (Signed)
NAME: Caleb Smith, Caleb Smith MEDICAL RECORD [id number] ACCOUNT [id number] DATE OF BIRTH:Feb 08, 1951 FACILITY: [facility] LOCATION: [facility] PHYSICIAN:Jowanda Heeg Randel Pigg, MD  OPERATIVE REPORT  DATE OF PROCEDURE:  08/09/2019  PREOPERATIVE DIAGNOSIS:  Right knee arthritis.  POSTOPERATIVE DIAGNOSIS:  Right knee arthritis.  PROCEDURE:  Right total knee replacement using Triathlon Stryker press-fit knee posterior cruciate retaining 5 femur, 6 tibia, 9 mm polyethylene insert, deep dish and 35 mm 3-peg press-fit patella.  SURGEON:  Meredith Pel, MD  ASSISTANT:  Annie Main, PA.  INDICATIONS:  The patient is a 69 year old patient with end-stage right knee arthritis, who presents for operative management of right knee arthritis after explanation of risks and benefits.  PROCEDURE IN DETAIL:  The patient was brought to the operating room where spinal anesthetic was induced.  Preoperative antibiotics administered.  Timeout was called.  Right leg was prescrubbed with alcohol and Betadine, allowed to air dry, prepped with  DuraPrep solution and draped in a sterile manner.  Ioban used to cover the operative field.  Leg was elevated and exsanguinated with the Esmarch wrap.  Tourniquet was inflated.  Total time 70 minutes at 300 mmHg.  Anterior approach to knee was made.   Skin and subcutaneous tissue were sharply divided.  Median parapatellar approach was made and marked with a #1 Vicryl suture.  The fat pad was partially excised.  Minimal soft tissue dissection was performed along the medial tibial plateau.  Lateral  patellofemoral ligament was released, soft tissue removed off the anterior distal femur.  At this time, the patella was everted.  Significant arthritis was present in the medial compartment.  There was some arthritis in the midportion of the lateral  femoral condyle, as well as arthritis on the undersurface of the patella.  At this time, intramedullary alignment was used to make a cut  perpendicular to the mechanical axis 9 mm off the least affected lateral side of the tibial plateau.  This was on the  lateral tibial plateau.  Two more millimeters were cut in order to get underneath the sclerotic portion on the medial tibial plateau.  Next, a distal femoral cut measuring 10 mm was made off the distal femur in 5 degrees of valgus using intramedullary  alignment.  The patient did have a preoperative flexion contracture of about 7 degrees.  Next, the sizer guide was placed.  The femur was a size 5.  The tibia was a size 6.  Anterior, posterior and chamfer cuts were made on the femur.  The tibia was keel  punched.  Bone quality was excellent.  Trial components placed and the patient had excellent alignment, full extension, full flexion with minimal liftoff and excellent patellar tracking.  Patella was then cut down from 26 to 15 mm.  A 3-peg patellar  trial was placed.  The patient again had excellent patellar tracking and stability and mobility.  Trial components were removed.  Thorough irrigation performed with 3 L of pulsatile irrigating solutions.  Tranexamic acid sponge allowed to sit in the  incision for 3 minutes.  Exparel, Marcaine, saline injected in the capsule during that time.  IrriSept solution was used throughout the whole case after the incision, as well as after the arthrotomy to prevent infection.  Following this 3-minute time  period, the sponges were removed.  The components were then press-fit into position with same stability parameters maintained.  Tourniquet released at this time.  Bleeding points encountered, controlled using electrocautery.  Thorough irrigation again  performed with pouring irrigation, followed  by IrriSept, followed by vancomycin powder within the arthrotomy, as well as above the arthrotomy.  Arthrotomy was closed with the knee over a bolster using #1 Vicryl suture, followed by interrupted inverted 0  Vicryl suture, 2-0 Vicryl suture and a 3-0  Monocryl.  Steri-Strips, Aquacel dressing, bulky immobilizer placed.  It should be noted that prior to placing the Steri-Strips, a solution of Marcaine, morphine, clonidine injected into the knee for postop pain  relief.  Luke's assistance was required at all times during the case for retraction and mobilization and opening and closing.  His assistance was a medical necessity.  VN/NUANCE  D:08/09/2019 T:08/09/2019 JOB:[id number]

## 2019-08-09 NOTE — Anesthesia Procedure Notes (Signed)
Procedure Name: MAC Date/Time: 08/09/2019 11:15 AM Performed by: Jenne Campus, CRNA Pre-anesthesia Checklist: Patient identified, Emergency Drugs available, Suction available, Patient being monitored and Timeout performed Oxygen Delivery Method: Simple face mask

## 2019-08-09 NOTE — Plan of Care (Signed)

## 2019-08-09 NOTE — Progress Notes (Signed)
Orthopedic Tech Progress Note Patient Details:  Caleb Smith [facility] 25-Mar-1951 [id number]  CPM Right Knee CPM Right Knee: On Right Knee Flexion (Degrees): 10 Right Knee Extension (Degrees): 40 Additional Comments: added ice  Post Interventions Patient Tolerated: Well Instructions Provided: Care of device, Adjustment of device Ortho Devices Type of Ortho Device: Bone foam zero knee Ortho Device/Splint Interventions: Application, Ordered   Post Interventions Patient Tolerated: Well Instructions Provided: Care of device, Adjustment of device   Caleb Smith 08/09/2019, 2:37 PM

## 2019-08-09 NOTE — Transfer of Care (Signed)
Immediate Anesthesia Transfer of Care Note  Patient: Caleb Smith  Procedure(s) Performed: RIGHT TOTAL KNEE ARTHROPLASTY-CEMENTED (Right Knee)  Patient Location: PACU  Anesthesia Type:MAC and Spinal  Level of Consciousness: awake, oriented and patient cooperative  Airway & Oxygen Therapy: Patient Spontanous Breathing and Patient connected to face mask oxygen  Post-op Assessment: Report given to RN and Post -op Vital signs reviewed and stable  Post vital signs: Reviewed  Last Vitals:  Vitals Value Taken Time  BP 102/65 08/09/19 1344  Temp    Pulse 61 08/09/19 1350  Resp 13 08/09/19 1350  SpO2 93 % 08/09/19 1350  Vitals shown include unvalidated device data.  Last Pain:  Vitals:   08/09/19 0912  TempSrc:   PainSc: 1       Patients Stated Pain Goal: 2 (88/91/69 4503)  Complications: No apparent anesthesia complications

## 2019-08-09 NOTE — Evaluation (Signed)
Physical Therapy Evaluation Patient Details Name: Caleb Smith MRN: [id number] DOB: 08-28-1950 Today's Date: 08/09/2019   History of Present Illness  Pt is a 69 y/o male s/p R TKA. PMH includes arthritis and GERD.   Clinical Impression  Pt is s/p surgery above with deficits below. Pt requiring min guard A for mobility using RW. Reviewed supine HEP and knee precautions. Will continue to follow acutely to maximize functional mobility independence and safety.     Follow Up Recommendations Follow surgeon's recommendation for DC plan and follow-up therapies    Equipment Recommendations  None recommended by PT    Recommendations for Other Services       Precautions / Restrictions Precautions Precautions: Knee Precaution Booklet Issued: No Precaution Comments: Reviewed knee precautions with pt.  Restrictions Weight Bearing Restrictions: Yes RLE Weight Bearing: Weight bearing as tolerated      Mobility  Bed Mobility Overal bed mobility: Needs Assistance Bed Mobility: Supine to Sit     Supine to sit: Supervision     General bed mobility comments: Supervision for safety.   Transfers Overall transfer level: Needs assistance Equipment used: Rolling walker (2 wheeled) Transfers: Sit to/from Stand Sit to Stand: Min guard         General transfer comment: Min guard for safety. Cues for safe hand placement.   Ambulation/Gait Ambulation/Gait assistance: Min guard Gait Distance (Feet): 5 Feet Assistive device: Rolling walker (2 wheeled) Gait Pattern/deviations: Step-to pattern;Decreased step length - right;Decreased step length - left;Decreased weight shift to right;Antalgic Gait velocity: Decreased   General Gait Details: Slow, antalgic gait. Cues for sequencing using RW.   Stairs            Wheelchair Mobility    Modified Rankin (Stroke Patients Only)       Balance Overall balance assessment: Needs assistance Sitting-balance support: No upper  extremity supported;Feet supported Sitting balance-Leahy Scale: Good     Standing balance support: Bilateral upper extremity supported;During functional activity Standing balance-Leahy Scale: Poor Standing balance comment: Reliant on BUE support                              Pertinent Vitals/Pain Pain Assessment: Faces Faces Pain Scale: Hurts little more Pain Location: R knee Pain Descriptors / Indicators: Aching;Operative site guarding Pain Intervention(s): Monitored during session;Limited activity within patient's tolerance;Repositioned    Home Living Family/patient expects to be discharged to:: Private residence Living Arrangements: Spouse/significant other Available Help at Discharge: Family Type of Home: House Home Access: Stairs to enter Entrance Stairs-Rails: None Entrance Stairs-Number of Steps: 3 Home Layout: One level Home Equipment: Environmental consultant - 2 wheels;Bedside commode      Prior Function Level of Independence: Independent               Hand Dominance        Extremity/Trunk Assessment   Upper Extremity Assessment Upper Extremity Assessment: Overall WFL for tasks assessed    Lower Extremity Assessment Lower Extremity Assessment: RLE deficits/detail RLE Deficits / Details: Deficits consistent with post op pain and weakness.        Communication   Communication: No difficulties  Cognition Arousal/Alertness: Awake/alert Behavior During Therapy: WFL for tasks assessed/performed Overall Cognitive Status: Within Functional Limits for tasks assessed  General Comments      Exercises Total Joint Exercises Ankle Circles/Pumps: AROM;Both;20 reps;Supine Quad Sets: AROM;Right;10 reps;Supine   Assessment/Plan    PT Assessment Patient needs continued PT services  PT Problem List Decreased strength;Decreased balance;Decreased mobility;Decreased knowledge of use of DME;Decreased knowledge of  precautions;Pain       PT Treatment Interventions DME instruction;Gait training;Stair training;Functional mobility training;Therapeutic activities;Therapeutic exercise;Balance training;Patient/family education    PT Goals (Current goals can be found in the Care Plan section)  Acute Rehab PT Goals Patient Stated Goal: to go home PT Goal Formulation: With patient Time For Goal Achievement: 08/23/19 Potential to Achieve Goals: Good    Frequency 7X/week   Barriers to discharge        Co-evaluation               AM-PAC PT "6 Clicks" Mobility  Outcome Measure Help needed turning from your back to your side while in a flat bed without using bedrails?: None Help needed moving from lying on your back to sitting on the side of a flat bed without using bedrails?: None Help needed moving to and from a bed to a chair (including a wheelchair)?: A Little Help needed standing up from a chair using your arms (e.g., wheelchair or bedside chair)?: A Little Help needed to walk in hospital room?: A Little Help needed climbing 3-5 steps with a railing? : A Lot 6 Click Score: 19    End of Session Equipment Utilized During Treatment: Gait belt Activity Tolerance: Patient tolerated treatment well Patient left: in chair;with call bell/phone within reach;with family/visitor present Nurse Communication: Mobility status PT Visit Diagnosis: Other abnormalities of gait and mobility (R26.89);Pain Pain - Right/Left: Right Pain - part of body: Knee    Time: [id number] PT Time Calculation (min) (ACUTE ONLY): 23 min   Charges:   PT Evaluation $PT Eval Low Complexity: 1 Low PT Treatments $Therapeutic Activity: 8-22 mins        Lou Miner, DPT  Acute Rehabilitation Services  Pager: [phone] Office: [phone]   Rudean Hitt 08/09/2019, 6:05 PM

## 2019-08-09 NOTE — Brief Op Note (Signed)
2/9/2021a    08/09/2019  1:33 PM  PATIENT:  Caleb Smith  69 y.o. male  PRE-OPERATIVE DIAGNOSIS:  right knee osteoarthritis  POST-OPERATIVE DIAGNOSIS:  right knee osteoarthritis  PROCEDURE:  Procedure(s): RIGHT TOTAL KNEE ARTHROPLASTY-CEMENTED  SURGEON:  Surgeon(s): Meredith Pel, MD  ASSISTANT: Magnant pa  ANESTHESIA:   spinal  EBL: 50 ml    Total I/O In: 1200 [I.V.:1000; IV Piggyback:200] Out: 50 [Blood:50]  BLOOD ADMINISTERED: none  DRAINS: none   LOCAL MEDICATIONS USED:  Marcaine mso4 clonidine exparel  SPECIMEN:  No Specimen  COUNTS:  YES  TOURNIQUET:   Total Tourniquet Time Documented: Thigh (Right) - 71 minutes Total: Thigh (Right) - 71 minutes   DICTATION: .Other Dictation: Dictation Number [id number]  PLAN OF CARE: Admit for overnight observation  PATIENT DISPOSITION:  PACU - hemodynamically stable

## 2019-08-09 NOTE — Anesthesia Procedure Notes (Signed)
Spinal  Patient location during procedure: OR Start time: 08/09/2019 11:19 AM End time: 08/09/2019 11:24 AM Staffing Anesthesiologist: Janeece Riggers, MD Preanesthetic Checklist Completed: patient identified, IV checked, site marked, risks and benefits discussed, surgical consent, monitors and equipment checked, pre-op evaluation and timeout performed Spinal Block Patient position: sitting Prep: DuraPrep Patient monitoring: heart rate, cardiac monitor, continuous pulse ox and blood pressure Approach: midline Location: L3-4 Injection technique: single-shot Needle Needle type: Sprotte  Needle gauge: 24 G Needle length: 9 cm Assessment Sensory level: T4

## 2019-08-09 NOTE — Anesthesia Procedure Notes (Signed)
Anesthesia Regional Block: Adductor canal block   Pre-Anesthetic Checklist: ,, timeout performed, Correct Patient, Correct Site, Correct Laterality, Correct Procedure, Correct Position, site marked, Risks and benefits discussed,  Surgical consent,  Pre-op evaluation,  At surgeon's request and post-op pain management  Laterality: Left and Right  Prep: chloraprep       Needles:  Injection technique: Single-shot  Needle Type: Echogenic Stimulator Needle     Needle Length: 5cm  Needle Gauge: 22     Additional Needles:   Procedures:, nerve stimulator,,, ultrasound used (permanent image in chart),,,,  Narrative:  Start time: 08/09/2019 10:29 AM End time: 08/09/2019 10:32 AM Injection made incrementally with aspirations every 5 mL.  Performed by: Personally  Anesthesiologist: Janeece Riggers, MD  Additional Notes: Functioning IV was confirmed and monitors were applied.  A 72mm 22ga Arrow echogenic stimulator needle was used. Sterile prep and drape,hand hygiene and sterile gloves were used. Ultrasound guidance: relevant anatomy identified, needle position confirmed, local anesthetic spread visualized around nerve(s)., vascular puncture avoided.  Image printed for medical record. Negative aspiration and negative test dose prior to incremental administration of local anesthetic. The patient tolerated the procedure well.

## 2019-08-09 NOTE — H&P (Signed)
TOTAL KNEE ADMISSION H&P  Patient is being admitted for right total knee arthroplasty.  Subjective:  Chief Complaint:right knee pain.  HPI: Caleb Smith, 69 y.o. male, has a history of pain and functional disability in the right knee due to arthritis and has failed non-surgical conservative treatments for greater than 12 weeks to includeNSAID's and/or analgesics, corticosteriod injections, viscosupplementation injections and activity modification.  Onset of symptoms was abrupt, starting 4 years ago with rapidlly worsening course since that time. The patient noted no past surgery on the right knee(s).  Patient currently rates pain in the right knee(s) at 9 out of 10 with activity. Patient has night pain, worsening of pain with activity and weight bearing, pain that interferes with activities of daily living, pain with passive range of motion, crepitus and joint swelling.  Patient has evidence of subchondral sclerosis and joint space narrowing by imaging studies. This patient has had Marginal improvement cortisone and gel injections approximately.  No personal or family history of DVT or pulmonary losing.  2 months ago.. There is no active infection.  Patient Active Problem List   Diagnosis Date Noted  . Open wound of right chest wall 07/18/2019  . Advanced care planning/counseling discussion 11/13/2017  . Coronary artery calcification seen on CAT scan 08/21/2017  . Atherosclerosis of aorta ([place]) 07/03/2017  . Lung nodule, solitary 06/28/2017  . Pedal edema 11/05/2016  . Upper airway cough syndrome 06/26/2016  . Plantar fasciitis, right 10/30/2015  . Health maintenance examination 10/27/2014  . Lesion of nose 07/14/2014  . Obesity, Class I, BMI 30-34.9 07/14/2014  . Hypothyroidism   . HLD (hyperlipidemia)   . GERD (gastroesophageal reflux disease)    Past Medical History:  Diagnosis Date  . Arthritis   . Coronary artery calcification seen on CAT scan 08/21/2017  . GERD  (gastroesophageal reflux disease) 1995   s/p esoph dilation for stricture  . HLD (hyperlipidemia)   . Hypothyroidism   . Obesity 07/14/2014    Past Surgical History:  Procedure Laterality Date  . CARDIOVASCULAR STRESS TEST  07/2017   low risk study ([place])  . COLONOSCOPY  03/2003   small sigmoid polyp, rpt 5 yrs [name])  . ESOPHAGOGASTRODUODENOSCOPY  1995   s/p esophageal dilation  . FACIAL RECONSTRUCTION SURGERY  1990s   cow headbutted him  . FINGER SURGERY Left teenager   reattachment of 2nd,3rd,4th  . TONSILLECTOMY      Current Facility-Administered Medications  Medication Dose Route Frequency Provider Last Rate Last Admin  . ceFAZolin (ANCEF) IVPB 2g/100 mL premix  2 g Intravenous To SS-Surg Ela Moffat, Tonna Corner, MD      . chlorhexidine (HIBICLENS) 4 % liquid 4 application  60 mL Topical Once Magnant, Charles L, PA-C      . chlorhexidine (HIBICLENS) 4 % liquid 4 application  60 mL Topical Once Magnant, Charles L, PA-C      . fentaNYL (SUBLIMAZE) 100 MCG/2ML injection           . lactated ringers infusion   Intravenous Continuous Janeece Riggers, MD      . midazolam (VERSED) 2 MG/2ML injection           . povidone-iodine 10 % swab 2 application  2 application Topical Once Magnant, Charles L, PA-C      . tranexamic acid (CYKLOKAPRON) 2,000 mg in sodium chloride 0.9 % 50 mL Topical Application  123XX123 mg Topical To OR Meredith Pel, MD      . tranexamic acid (CYKLOKAPRON) IVPB 1,000  mg  1,000 mg Intravenous To OR Marlou Sa Tonna Corner, MD       No Known Allergies  Social History   Tobacco Use  . Smoking status: Never Smoker  . Smokeless tobacco: Former Systems developer    Types: Chew  Substance Use Topics  . Alcohol use: Yes    Alcohol/week: 0.0 standard drinks    Comment: 2-3 beer daily    Family History  Problem Relation Age of Onset  . Cancer Mother        breast  . CAD Maternal Uncle 46       massive MI  . Suicidality Father        suicide  . Stroke Neg Hx   .  Diabetes Neg Hx   . Hypertension Neg Hx      Review of Systems  Objective:  Physical Exam  Vital signs in last 24 hours: Temp:  [98 F (36.7 C)] 98 F (36.7 C) (02/09 0904) Pulse Rate:  [63] 63 (02/09 0904) Resp:  [17] 17 (02/09 0904) BP: (165)/(80) 165/80 (02/09 0904) SpO2:  [98 %] 98 % (02/09 0904) Weight:  [91.2 kg] 91.2 kg (02/09 0904)  Labs:   Estimated body mass index is 32.44 kg/m as calculated from the following:   Height as of this encounter: 5\' 6"  (1.676 m).   Weight as of this encounter: 91.2 kg.   Imaging Review Plain radiographs demonstrate moderate degenerative joint disease of the right knee(s). The overall alignment ismild varus. The bone quality appears to be good for age and reported activity level.      Assessment/Plan:  End stage arthritis, right knee   The patient history, physical examination, clinical judgment of the provider and imaging studies are consistent with end stage degenerative joint disease of the right knee(s) and total knee arthroplasty is deemed medically necessary. The treatment options including medical management, injection therapy arthroscopy and arthroplasty were discussed at length. The risks and benefits of total knee arthroplasty were presented and reviewed. The risks due to aseptic loosening, infection, stiffness, patella tracking problems, thromboembolic complications and other imponderables were discussed. The patient acknowledged the explanation, agreed to proceed with the plan and consent was signed. Patient is being admitted for inpatient treatment for surgery, pain control, PT, OT, prophylactic antibiotics, VTE prophylaxis, progressive ambulation and ADL's and discharge planning. The patient is planning to be discharged home with home health services     Patient's anticipated LOS is less than 2 midnights, meeting these requirements: - Younger than 29 - Lives within 1 hour of care - Has a competent adult at home to  recover with post-op recover - NO history of  - Chronic pain requiring opiods  - Diabetes  - Coronary Artery Disease  - Heart failure  - Heart attack  - Stroke  - DVT/VTE  - Cardiac arrhythmia  - Respiratory Failure/COPD  - Renal failure  - Anemia  - Advanced Liver disease

## 2019-08-10 ENCOUNTER — Encounter (HOSPITAL_COMMUNITY): Payer: Self-pay | Admitting: Orthopedic Surgery

## 2019-08-10 DIAGNOSIS — I700 Atherosclerosis of aorta: Secondary | ICD-10-CM | POA: Diagnosis not present

## 2019-08-10 DIAGNOSIS — Z79899 Other long term (current) drug therapy: Secondary | ICD-10-CM | POA: Diagnosis not present

## 2019-08-10 DIAGNOSIS — Z6832 Body mass index (BMI) 32.0-32.9, adult: Secondary | ICD-10-CM | POA: Diagnosis not present

## 2019-08-10 DIAGNOSIS — E039 Hypothyroidism, unspecified: Secondary | ICD-10-CM | POA: Diagnosis not present

## 2019-08-10 DIAGNOSIS — I2510 Atherosclerotic heart disease of native coronary artery without angina pectoris: Secondary | ICD-10-CM | POA: Diagnosis not present

## 2019-08-10 DIAGNOSIS — E785 Hyperlipidemia, unspecified: Secondary | ICD-10-CM | POA: Diagnosis not present

## 2019-08-10 DIAGNOSIS — Z7982 Long term (current) use of aspirin: Secondary | ICD-10-CM | POA: Diagnosis not present

## 2019-08-10 DIAGNOSIS — K219 Gastro-esophageal reflux disease without esophagitis: Secondary | ICD-10-CM | POA: Diagnosis not present

## 2019-08-10 DIAGNOSIS — M1711 Unilateral primary osteoarthritis, right knee: Secondary | ICD-10-CM | POA: Diagnosis not present

## 2019-08-10 DIAGNOSIS — E669 Obesity, unspecified: Secondary | ICD-10-CM | POA: Diagnosis not present

## 2019-08-10 DIAGNOSIS — Z79890 Hormone replacement therapy (postmenopausal): Secondary | ICD-10-CM | POA: Diagnosis not present

## 2019-08-10 DIAGNOSIS — Z791 Long term (current) use of non-steroidal anti-inflammatories (NSAID): Secondary | ICD-10-CM | POA: Diagnosis not present

## 2019-08-10 MED ORDER — ASPIRIN 81 MG PO CHEW: 81.0000 mg | CHEWABLE_TABLET | Freq: Two times a day (BID) | ORAL | 0 refills | Status: DC

## 2019-08-10 MED ORDER — OXYCODONE HCL 5 MG PO TABS: 5.0000 mg | ORAL_TABLET | ORAL | 0 refills | Status: DC | PRN

## 2019-08-10 MED ORDER — METHOCARBAMOL 500 MG PO TABS: 500.0000 mg | ORAL_TABLET | Freq: Three times a day (TID) | ORAL | 0 refills | Status: DC | PRN

## 2019-08-10 NOTE — Progress Notes (Signed)
Physical Therapy Treatment Patient Details Name: Caleb Smith MRN: [id number] DOB: [birth year] Today's Date: 08/10/2019    History of Present Illness Pt is a 69 y/o male s/p R TKA. PMH includes arthritis and GERD.     PT Comments    Pt making excellent progress.  Has 0-90 degrees ROM and good quad activation.  He was able to ambulate 150'x2 and perform stair with RW backwards and min guard.  Pt with good pain control during therapy.  Wife present and was able to assist as needed.    Follow Up Recommendations  Follow surgeon's recommendation for DC plan and follow-up therapies     Equipment Recommendations  None recommended by PT    Recommendations for Other Services       Precautions / Restrictions Precautions Precautions: Knee;Fall Restrictions Weight Bearing Restrictions: Yes RLE Weight Bearing: Weight bearing as tolerated    Mobility  Bed Mobility Overal bed mobility: Needs Assistance Bed Mobility: Supine to Sit;Sit to Supine     Supine to sit: Supervision Sit to supine: Supervision   General bed mobility comments: Supervision for safety.   Transfers Overall transfer level: Needs assistance Equipment used: Rolling walker (2 wheeled) Transfers: Sit to/from Stand Sit to Stand: Supervision         General transfer comment: Cues for safe hand placement.  Ambulation/Gait Ambulation/Gait assistance: Min guard;Supervision Gait Distance (Feet): 150 Feet(x2) Assistive device: Rolling walker (2 wheeled)   Gait velocity: Decreased   General Gait Details: Partial step through pattern; cued for sequencing with RW ; progressed from min guard to supervision   Stairs Stairs: Yes Stairs assistance: Min guard Stair Management: Two rails;Step to pattern;Backwards;With walker;With cane;Forwards Number of Stairs: 9 General stair comments: Performed 5 steps with rails and min guard without difficulty; Then performed 2 with cane and HHA required min A; Then  performed 2 with walker backwards with min guard - wife present and educated on guarding RW.  Pt and wife preferred method with RW - gave handout.  Required cues for sequence with all.   Wheelchair Mobility    Modified Rankin (Stroke Patients Only)       Balance Overall balance assessment: Needs assistance Sitting-balance support: No upper extremity supported;Feet supported Sitting balance-Leahy Scale: Normal     Standing balance support: Bilateral upper extremity supported;During functional activity Standing balance-Leahy Scale: Fair                              Cognition Arousal/Alertness: Awake/alert Behavior During Therapy: WFL for tasks assessed/performed Overall Cognitive Status: Within Functional Limits for tasks assessed                                        Exercises Total Joint Exercises Ankle Circles/Pumps: AROM;Both;20 reps;Supine Quad Sets: AROM;Right;10 reps;Supine Towel Squeeze: AROM;Supine;Both;10 reps Heel Slides: AROM;Supine;Right;10 reps Hip ABduction/ADduction: AROM;Supine;Right;10 reps Straight Leg Raises: AROM;Supine;Right;10 reps Long Arc Quad: AROM;Seated;Right;10 reps Knee Flexion: AROM;Seated;Right;10 reps Goniometric ROM: R knee 0 to 90 degrees    General Comments  Educated on safe ice use, CPM, stairs, walking, HEP, no pivots on knee, and resting with leg straight.       Pertinent Vitals/Pain Pain Assessment: 0-10 Pain Score: 3  Pain Location: R knee Pain Descriptors / Indicators: Aching;Operative site guarding Pain Intervention(s): Limited activity within patient's tolerance;Repositioned;Ice applied;Monitored during session  Home Living                      Prior Function            PT Goals (current goals can now be found in the care plan section) Progress towards PT goals: Progressing toward goals    Frequency    7X/week      PT Plan Current plan remains appropriate     Co-evaluation              AM-PAC PT "6 Clicks" Mobility   Outcome Measure  Help needed turning from your back to your side while in a flat bed without using bedrails?: None Help needed moving from lying on your back to sitting on the side of a flat bed without using bedrails?: None Help needed moving to and from a bed to a chair (including a wheelchair)?: None Help needed standing up from a chair using your arms (e.g., wheelchair or bedside chair)?: None Help needed to walk in hospital room?: None Help needed climbing 3-5 steps with a railing? : A Little 6 Click Score: 23    End of Session Equipment Utilized During Treatment: Gait belt Activity Tolerance: Patient tolerated treatment well Patient left: in chair;with call bell/phone within reach;with family/visitor present Nurse Communication: Mobility status PT Visit Diagnosis: Other abnormalities of gait and mobility (R26.89);Pain     Time: 1015-1100 PT Time Calculation (min) (ACUTE ONLY): 45 min  Charges:  $Gait Training: 23-37 mins $Therapeutic Exercise: 8-22 mins                     Caleb Smith, PT Acute Rehab Services Pager [phone] [facility] [phone] [facility] [phone]    Caleb Smith 08/10/2019, 12:08 PM

## 2019-08-10 NOTE — Progress Notes (Signed)
Patient discharging home. Discharge instructions explained to patient and patients wife and they both verbalized understanding. Packed all personal belongings. No further questions or concerns voiced.

## 2019-08-10 NOTE — TOC Transition Note (Signed)
Transition of Care [facility]) - CM/SW Discharge Note   Patient Details  Name: Caleb Smith MRN: [id number] Date of Birth: [birth year]  Transition of Care [facility]) CM/SW Contact:  Bartholomew Crews, RN Phone Number: [phone] 08/10/2019, 2:03 PM   Clinical Narrative:    Patient ready to transition home today. [facility] accepted referral for [facility] PT. Per nurse patient has all needed DME. No further TOC needs identified at this time.    Final next level of care: [place] Barriers to Discharge: No Barriers Identified   Patient Goals and CMS Choice        Discharge Placement                       Discharge Plan and Services                DME Arranged: N/A DME Agency: NA       HH Arranged: PT HH Agency: [facility] (formerly [employer]) Date [place]: 08/10/19 Time [place]: 70 Representative spoke with at [place]: [place] ([place]) Interventions     Readmission Risk Interventions No flowsheet data found.

## 2019-08-10 NOTE — Progress Notes (Signed)
Patient stable.  Pain controlled. Patient can do a straight leg raise.  Foot perfused.  Ankle dorsiflexion intact Dressing dry Patient needs physical therapy this morning and then he will be ready for discharge by this afternoon.  He is doing quite well.

## 2019-08-11 ENCOUNTER — Encounter: Payer: Self-pay | Admitting: *Deleted

## 2019-08-11 DIAGNOSIS — K219 Gastro-esophageal reflux disease without esophagitis: Secondary | ICD-10-CM | POA: Diagnosis not present

## 2019-08-11 DIAGNOSIS — M722 Plantar fascial fibromatosis: Secondary | ICD-10-CM | POA: Diagnosis not present

## 2019-08-11 DIAGNOSIS — E785 Hyperlipidemia, unspecified: Secondary | ICD-10-CM | POA: Diagnosis not present

## 2019-08-11 DIAGNOSIS — Z87891 Personal history of nicotine dependence: Secondary | ICD-10-CM | POA: Diagnosis not present

## 2019-08-11 DIAGNOSIS — R911 Solitary pulmonary nodule: Secondary | ICD-10-CM | POA: Diagnosis not present

## 2019-08-11 DIAGNOSIS — I2510 Atherosclerotic heart disease of native coronary artery without angina pectoris: Secondary | ICD-10-CM | POA: Diagnosis not present

## 2019-08-11 DIAGNOSIS — Z471 Aftercare following joint replacement surgery: Secondary | ICD-10-CM | POA: Diagnosis not present

## 2019-08-11 DIAGNOSIS — E669 Obesity, unspecified: Secondary | ICD-10-CM | POA: Diagnosis not present

## 2019-08-11 DIAGNOSIS — I700 Atherosclerosis of aorta: Secondary | ICD-10-CM | POA: Diagnosis not present

## 2019-08-11 DIAGNOSIS — E039 Hypothyroidism, unspecified: Secondary | ICD-10-CM | POA: Diagnosis not present

## 2019-08-11 DIAGNOSIS — Z9181 History of falling: Secondary | ICD-10-CM | POA: Diagnosis not present

## 2019-08-11 DIAGNOSIS — Z7982 Long term (current) use of aspirin: Secondary | ICD-10-CM | POA: Diagnosis not present

## 2019-08-11 DIAGNOSIS — Z6832 Body mass index (BMI) 32.0-32.9, adult: Secondary | ICD-10-CM | POA: Diagnosis not present

## 2019-08-11 DIAGNOSIS — Z96651 Presence of right artificial knee joint: Secondary | ICD-10-CM | POA: Diagnosis not present

## 2019-08-11 NOTE — Anesthesia Postprocedure Evaluation (Signed)
Anesthesia Post Note  Patient: Caleb Smith  Procedure(s) Performed: RIGHT TOTAL KNEE ARTHROPLASTY-CEMENTED (Right Knee)     Patient location during evaluation: PACU Anesthesia Type: Spinal Level of consciousness: oriented and awake and alert Pain management: pain level controlled Vital Signs Assessment: post-procedure vital signs reviewed and stable Respiratory status: spontaneous breathing, respiratory function stable and patient connected to nasal cannula oxygen Cardiovascular status: blood pressure returned to baseline and stable Postop Assessment: no headache, no backache and no apparent nausea or vomiting Anesthetic complications: no    Last Vitals:  Vitals:   08/10/19 0500 08/10/19 0847  BP: 116/70 137/73  Pulse:  75  Resp: 16 18  Temp: 36.7 C (!) 36.4 C  SpO2: 96% 97%    Last Pain:  Vitals:   08/10/19 1000  TempSrc:   PainSc: 4                  Haneefah Venturini

## 2019-08-12 DIAGNOSIS — K219 Gastro-esophageal reflux disease without esophagitis: Secondary | ICD-10-CM | POA: Diagnosis not present

## 2019-08-12 DIAGNOSIS — I2510 Atherosclerotic heart disease of native coronary artery without angina pectoris: Secondary | ICD-10-CM | POA: Diagnosis not present

## 2019-08-12 DIAGNOSIS — Z7982 Long term (current) use of aspirin: Secondary | ICD-10-CM | POA: Diagnosis not present

## 2019-08-12 DIAGNOSIS — E039 Hypothyroidism, unspecified: Secondary | ICD-10-CM | POA: Diagnosis not present

## 2019-08-12 DIAGNOSIS — I700 Atherosclerosis of aorta: Secondary | ICD-10-CM | POA: Diagnosis not present

## 2019-08-12 DIAGNOSIS — M722 Plantar fascial fibromatosis: Secondary | ICD-10-CM | POA: Diagnosis not present

## 2019-08-12 DIAGNOSIS — Z471 Aftercare following joint replacement surgery: Secondary | ICD-10-CM | POA: Diagnosis not present

## 2019-08-12 DIAGNOSIS — E785 Hyperlipidemia, unspecified: Secondary | ICD-10-CM | POA: Diagnosis not present

## 2019-08-12 DIAGNOSIS — Z96651 Presence of right artificial knee joint: Secondary | ICD-10-CM | POA: Diagnosis not present

## 2019-08-12 DIAGNOSIS — Z9181 History of falling: Secondary | ICD-10-CM | POA: Diagnosis not present

## 2019-08-12 DIAGNOSIS — R911 Solitary pulmonary nodule: Secondary | ICD-10-CM | POA: Diagnosis not present

## 2019-08-12 DIAGNOSIS — Z6832 Body mass index (BMI) 32.0-32.9, adult: Secondary | ICD-10-CM | POA: Diagnosis not present

## 2019-08-12 DIAGNOSIS — Z87891 Personal history of nicotine dependence: Secondary | ICD-10-CM | POA: Diagnosis not present

## 2019-08-12 DIAGNOSIS — E669 Obesity, unspecified: Secondary | ICD-10-CM | POA: Diagnosis not present

## 2019-08-15 DIAGNOSIS — E785 Hyperlipidemia, unspecified: Secondary | ICD-10-CM | POA: Diagnosis not present

## 2019-08-15 DIAGNOSIS — I2510 Atherosclerotic heart disease of native coronary artery without angina pectoris: Secondary | ICD-10-CM | POA: Diagnosis not present

## 2019-08-15 DIAGNOSIS — Z96651 Presence of right artificial knee joint: Secondary | ICD-10-CM | POA: Diagnosis not present

## 2019-08-15 DIAGNOSIS — Z87891 Personal history of nicotine dependence: Secondary | ICD-10-CM | POA: Diagnosis not present

## 2019-08-15 DIAGNOSIS — R911 Solitary pulmonary nodule: Secondary | ICD-10-CM | POA: Diagnosis not present

## 2019-08-15 DIAGNOSIS — Z471 Aftercare following joint replacement surgery: Secondary | ICD-10-CM | POA: Diagnosis not present

## 2019-08-15 DIAGNOSIS — E039 Hypothyroidism, unspecified: Secondary | ICD-10-CM | POA: Diagnosis not present

## 2019-08-15 DIAGNOSIS — Z6832 Body mass index (BMI) 32.0-32.9, adult: Secondary | ICD-10-CM | POA: Diagnosis not present

## 2019-08-15 DIAGNOSIS — Z9181 History of falling: Secondary | ICD-10-CM | POA: Diagnosis not present

## 2019-08-15 DIAGNOSIS — K219 Gastro-esophageal reflux disease without esophagitis: Secondary | ICD-10-CM | POA: Diagnosis not present

## 2019-08-15 DIAGNOSIS — M722 Plantar fascial fibromatosis: Secondary | ICD-10-CM | POA: Diagnosis not present

## 2019-08-15 DIAGNOSIS — Z7982 Long term (current) use of aspirin: Secondary | ICD-10-CM | POA: Diagnosis not present

## 2019-08-15 DIAGNOSIS — E669 Obesity, unspecified: Secondary | ICD-10-CM | POA: Diagnosis not present

## 2019-08-15 DIAGNOSIS — I700 Atherosclerosis of aorta: Secondary | ICD-10-CM | POA: Diagnosis not present

## 2019-08-17 DIAGNOSIS — Z7982 Long term (current) use of aspirin: Secondary | ICD-10-CM | POA: Diagnosis not present

## 2019-08-17 DIAGNOSIS — Z87891 Personal history of nicotine dependence: Secondary | ICD-10-CM | POA: Diagnosis not present

## 2019-08-17 DIAGNOSIS — R911 Solitary pulmonary nodule: Secondary | ICD-10-CM | POA: Diagnosis not present

## 2019-08-17 DIAGNOSIS — E039 Hypothyroidism, unspecified: Secondary | ICD-10-CM | POA: Diagnosis not present

## 2019-08-17 DIAGNOSIS — I700 Atherosclerosis of aorta: Secondary | ICD-10-CM | POA: Diagnosis not present

## 2019-08-17 DIAGNOSIS — K219 Gastro-esophageal reflux disease without esophagitis: Secondary | ICD-10-CM | POA: Diagnosis not present

## 2019-08-17 DIAGNOSIS — Z471 Aftercare following joint replacement surgery: Secondary | ICD-10-CM | POA: Diagnosis not present

## 2019-08-17 DIAGNOSIS — Z6832 Body mass index (BMI) 32.0-32.9, adult: Secondary | ICD-10-CM | POA: Diagnosis not present

## 2019-08-17 DIAGNOSIS — Z9181 History of falling: Secondary | ICD-10-CM | POA: Diagnosis not present

## 2019-08-17 DIAGNOSIS — I2510 Atherosclerotic heart disease of native coronary artery without angina pectoris: Secondary | ICD-10-CM | POA: Diagnosis not present

## 2019-08-17 DIAGNOSIS — M722 Plantar fascial fibromatosis: Secondary | ICD-10-CM | POA: Diagnosis not present

## 2019-08-17 DIAGNOSIS — Z96651 Presence of right artificial knee joint: Secondary | ICD-10-CM | POA: Diagnosis not present

## 2019-08-17 DIAGNOSIS — E669 Obesity, unspecified: Secondary | ICD-10-CM | POA: Diagnosis not present

## 2019-08-17 DIAGNOSIS — E785 Hyperlipidemia, unspecified: Secondary | ICD-10-CM | POA: Diagnosis not present

## 2019-08-18 NOTE — Discharge Summary (Signed)
Physician Discharge Summary      Patient ID: Caleb Smith MRN: [id number] DOB/AGE: March 22, 1951 69 y.o.  Admit date: 08/09/2019 Discharge date: 08/10/2019  Admission Diagnoses:  Active Problems:   Arthritis of right knee   Discharge Diagnoses:  Same  Surgeries: Procedure(s): RIGHT TOTAL KNEE ARTHROPLASTY-CEMENTED on 08/09/2019   Consultants:   Discharged Condition: Stable  Hospital Course: Caleb Smith is an 70 y.o. male who was admitted 08/09/2019 with a chief complaint of right knee pain, and found to have a diagnosis of right knee OA.  They were brought to the operating room on 08/09/2019 and underwent the above named procedures.  Pt awoke from anesthesia without complication and was transferred to the floor. On POD1, patient's pain was controlled and he was able to mobilize well.  He ambulated well during PT and was discharged home on POD1.  Pt will f/u with Dr. Marlou Sa in clinic in ~2 weeks.   Antibiotics given:  Anti-infectives (From admission, onward)   Start     Dose/Rate Route Frequency Ordered Stop   08/09/19 1630  ceFAZolin (ANCEF) IVPB 2g/100 mL premix     2 g 200 mL/hr over 30 Minutes Intravenous Every 6 hours 08/09/19 1629 08/09/19 2134   08/09/19 1236  vancomycin (VANCOCIN) powder  Status:  Discontinued       As needed 08/09/19 1237 08/09/19 1339   08/09/19 1000  ceFAZolin (ANCEF) IVPB 2g/100 mL premix     2 g 200 mL/hr over 30 Minutes Intravenous To ShortStay Surgical 08/09/19 0747 08/09/19 1119    .  Recent vital signs:  Vitals:   08/10/19 0500 08/10/19 0847  BP: 116/70 137/73  Pulse:  75  Resp: 16 18  Temp: 98.1 F (36.7 C) (!) 97.5 F (36.4 C)  SpO2: 96% 97%    Recent laboratory studies:  Results for orders placed or performed during the hospital encounter of 08/05/19  Urine culture   Specimen: Urine, Clean Catch  Result Value Ref Range   Specimen Description URINE, CLEAN CATCH    Special Requests NONE    Culture      NO  GROWTH Performed at [facility], [place] [address] [place], [place] [address]    Report Status 08/06/2019 FINAL   Surgical pcr screen   Specimen: Nasal Mucosa; Nasal Swab  Result Value Ref Range   MRSA, PCR NEGATIVE NEGATIVE   Staphylococcus aureus NEGATIVE NEGATIVE  Basic metabolic panel  Result Value Ref Range   Sodium 140 135 - 145 mmol/L   Potassium 4.3 3.5 - 5.1 mmol/L   Chloride 106 98 - 111 mmol/L   CO2 23 22 - 32 mmol/L   Glucose, Bld 92 70 - 99 mg/dL   BUN 13 8 - 23 mg/dL   Creatinine, Ser 1.35 (H) 0.61 - 1.24 mg/dL   Calcium 9.1 8.9 - 10.3 mg/dL   GFR calc non Af Amer 54 (L) >60 mL/min   GFR calc Af Amer >60 >60 mL/min   Anion gap 11 5 - 15  CBC  Result Value Ref Range   WBC 6.7 4.0 - 10.5 K/uL   RBC 4.76 4.22 - 5.81 MIL/uL   Hemoglobin 14.8 13.0 - 17.0 g/dL   HCT 46.4 39.0 - 52.0 %   MCV 97.5 80.0 - 100.0 fL   MCH 31.1 26.0 - 34.0 pg   MCHC 31.9 30.0 - 36.0 g/dL   RDW 13.9 11.5 - 15.5 %   Platelets 275 150 - 400 K/uL  nRBC 0.0 0.0 - 0.2 %  Urinalysis, Routine w reflex microscopic  Result Value Ref Range   Color, Urine STRAW (A) YELLOW   APPearance CLEAR CLEAR   Specific Gravity, Urine 1.004 (L) 1.005 - 1.030   pH 7.0 5.0 - 8.0   Glucose, UA NEGATIVE NEGATIVE mg/dL   Hgb urine dipstick NEGATIVE NEGATIVE   Bilirubin Urine NEGATIVE NEGATIVE   Ketones, ur NEGATIVE NEGATIVE mg/dL   Protein, ur NEGATIVE NEGATIVE mg/dL   Nitrite NEGATIVE NEGATIVE   Leukocytes,Ua NEGATIVE NEGATIVE    Discharge Medications:   Allergies as of 08/10/2019   No Known Allergies     Medication List    STOP taking these medications   cephALEXin 500 MG capsule Commonly known as: KEFLEX   ibuprofen 200 MG tablet Commonly known as: ADVIL     TAKE these medications   aspirin 81 MG chewable tablet Chew 1 tablet (81 mg total) by mouth 2 (two) times daily. What changed: when to take this   atorvastatin 80 MG tablet Commonly known as: LIPITOR TAKE 1 TABLET BY MOUTH EVERY  DAY   celecoxib 200 MG capsule Commonly known as: CeleBREX Take 1 capsule (200 mg total) by mouth 2 (two) times daily. Additionally, take 400mg  the morning of your surgery as soon as you wake up with a small sip of water   cholecalciferol 1000 units tablet Commonly known as: VITAMIN D Take 1,000 Units by mouth daily.   ezetimibe 10 MG tablet Commonly known as: ZETIA Take 1 tablet (10 mg total) by mouth daily.   Fish Oil 1200 MG Caps Take 1,200 mg by mouth daily.   methocarbamol 500 MG tablet Commonly known as: ROBAXIN Take 1 tablet (500 mg total) by mouth every 8 (eight) hours as needed for muscle spasms.   omeprazole 40 MG capsule Commonly known as: PRILOSEC Take 1 capsule (40 mg total) by mouth daily.   oxyCODONE 5 MG immediate release tablet Commonly known as: Oxy IR/ROXICODONE Take 1 tablet (5 mg total) by mouth every 4 (four) hours as needed for moderate pain (pain score 4-6).   Synthroid 100 MCG tablet Generic drug: levothyroxine Take 1 tablet (100 mcg total) by mouth daily before breakfast.   vitamin B-12 1000 MCG tablet Commonly known as: CYANOCOBALAMIN Take 1,000 mcg by mouth daily.       Diagnostic Studies: No results found.  Disposition: Discharge disposition: 01-Home or Self Care       Discharge Instructions    Call MD / Call 911   Complete by: As directed    If you experience chest pain or shortness of breath, CALL 911 and be transported to the hospital emergency room.  If you develope a fever above 101 F, pus (white drainage) or increased drainage or redness at the wound, or calf pain, call your surgeon's office.   Constipation Prevention   Complete by: As directed    Drink plenty of fluids.  Prune juice may be helpful.  You may use a stool softener, such as Colace (over the counter) 100 mg twice a day.  Use MiraLax (over the counter) for constipation as needed.   Diet - low sodium heart healthy   Complete by: As directed    Discharge  instructions   Complete by: As directed    You may shower, dressing is waterproof.  Do not remove the dressing, we will remove it at your first post-op appointment.  Do not take a bath or soak the knee in a tub  or pool.  You may weightbear as you can tolerate on the operative leg with a walker.  Continue using the CPM machine 3 times per day for at least one hour each time, increasing the degrees of range of motion daily.  Use the blue cradle boot under your heel to work on getting your leg straight.  Do NOT put a pillow under your knee.  You will follow-up with Dr. Marlou Sa in the clinic in 2 weeks at your given appointment date.   Increase activity slowly as tolerated   Complete by: As directed       Follow-up Information    [facility]   Specialty: [place] Why: The office will f/u to schedule PT visits Contact information: [place] [address] [phone]            Signed: Donella Stade 08/18/2019, 12:48 PM

## 2019-08-20 DIAGNOSIS — I700 Atherosclerosis of aorta: Secondary | ICD-10-CM | POA: Diagnosis not present

## 2019-08-20 DIAGNOSIS — I2510 Atherosclerotic heart disease of native coronary artery without angina pectoris: Secondary | ICD-10-CM | POA: Diagnosis not present

## 2019-08-20 DIAGNOSIS — K219 Gastro-esophageal reflux disease without esophagitis: Secondary | ICD-10-CM | POA: Diagnosis not present

## 2019-08-20 DIAGNOSIS — Z87891 Personal history of nicotine dependence: Secondary | ICD-10-CM | POA: Diagnosis not present

## 2019-08-20 DIAGNOSIS — E669 Obesity, unspecified: Secondary | ICD-10-CM | POA: Diagnosis not present

## 2019-08-20 DIAGNOSIS — Z96651 Presence of right artificial knee joint: Secondary | ICD-10-CM | POA: Diagnosis not present

## 2019-08-20 DIAGNOSIS — Z7982 Long term (current) use of aspirin: Secondary | ICD-10-CM | POA: Diagnosis not present

## 2019-08-20 DIAGNOSIS — E785 Hyperlipidemia, unspecified: Secondary | ICD-10-CM | POA: Diagnosis not present

## 2019-08-20 DIAGNOSIS — R911 Solitary pulmonary nodule: Secondary | ICD-10-CM | POA: Diagnosis not present

## 2019-08-20 DIAGNOSIS — M722 Plantar fascial fibromatosis: Secondary | ICD-10-CM | POA: Diagnosis not present

## 2019-08-20 DIAGNOSIS — Z9181 History of falling: Secondary | ICD-10-CM | POA: Diagnosis not present

## 2019-08-20 DIAGNOSIS — E039 Hypothyroidism, unspecified: Secondary | ICD-10-CM | POA: Diagnosis not present

## 2019-08-20 DIAGNOSIS — Z471 Aftercare following joint replacement surgery: Secondary | ICD-10-CM | POA: Diagnosis not present

## 2019-08-20 DIAGNOSIS — Z6832 Body mass index (BMI) 32.0-32.9, adult: Secondary | ICD-10-CM | POA: Diagnosis not present

## 2019-08-22 DIAGNOSIS — E669 Obesity, unspecified: Secondary | ICD-10-CM | POA: Diagnosis not present

## 2019-08-22 DIAGNOSIS — I700 Atherosclerosis of aorta: Secondary | ICD-10-CM | POA: Diagnosis not present

## 2019-08-22 DIAGNOSIS — I2510 Atherosclerotic heart disease of native coronary artery without angina pectoris: Secondary | ICD-10-CM | POA: Diagnosis not present

## 2019-08-22 DIAGNOSIS — R911 Solitary pulmonary nodule: Secondary | ICD-10-CM | POA: Diagnosis not present

## 2019-08-22 DIAGNOSIS — Z87891 Personal history of nicotine dependence: Secondary | ICD-10-CM | POA: Diagnosis not present

## 2019-08-22 DIAGNOSIS — E785 Hyperlipidemia, unspecified: Secondary | ICD-10-CM | POA: Diagnosis not present

## 2019-08-22 DIAGNOSIS — Z471 Aftercare following joint replacement surgery: Secondary | ICD-10-CM | POA: Diagnosis not present

## 2019-08-22 DIAGNOSIS — E039 Hypothyroidism, unspecified: Secondary | ICD-10-CM | POA: Diagnosis not present

## 2019-08-22 DIAGNOSIS — Z6832 Body mass index (BMI) 32.0-32.9, adult: Secondary | ICD-10-CM | POA: Diagnosis not present

## 2019-08-22 DIAGNOSIS — Z96651 Presence of right artificial knee joint: Secondary | ICD-10-CM | POA: Diagnosis not present

## 2019-08-22 DIAGNOSIS — Z7982 Long term (current) use of aspirin: Secondary | ICD-10-CM | POA: Diagnosis not present

## 2019-08-22 DIAGNOSIS — M722 Plantar fascial fibromatosis: Secondary | ICD-10-CM | POA: Diagnosis not present

## 2019-08-22 DIAGNOSIS — K219 Gastro-esophageal reflux disease without esophagitis: Secondary | ICD-10-CM | POA: Diagnosis not present

## 2019-08-22 DIAGNOSIS — Z9181 History of falling: Secondary | ICD-10-CM | POA: Diagnosis not present

## 2019-08-23 DIAGNOSIS — Z96651 Presence of right artificial knee joint: Secondary | ICD-10-CM | POA: Diagnosis not present

## 2019-08-23 DIAGNOSIS — I2510 Atherosclerotic heart disease of native coronary artery without angina pectoris: Secondary | ICD-10-CM | POA: Diagnosis not present

## 2019-08-23 DIAGNOSIS — K219 Gastro-esophageal reflux disease without esophagitis: Secondary | ICD-10-CM | POA: Diagnosis not present

## 2019-08-23 DIAGNOSIS — Z7982 Long term (current) use of aspirin: Secondary | ICD-10-CM | POA: Diagnosis not present

## 2019-08-23 DIAGNOSIS — Z87891 Personal history of nicotine dependence: Secondary | ICD-10-CM | POA: Diagnosis not present

## 2019-08-23 DIAGNOSIS — E785 Hyperlipidemia, unspecified: Secondary | ICD-10-CM | POA: Diagnosis not present

## 2019-08-23 DIAGNOSIS — Z471 Aftercare following joint replacement surgery: Secondary | ICD-10-CM | POA: Diagnosis not present

## 2019-08-23 DIAGNOSIS — M722 Plantar fascial fibromatosis: Secondary | ICD-10-CM | POA: Diagnosis not present

## 2019-08-23 DIAGNOSIS — E669 Obesity, unspecified: Secondary | ICD-10-CM | POA: Diagnosis not present

## 2019-08-23 DIAGNOSIS — Z9181 History of falling: Secondary | ICD-10-CM | POA: Diagnosis not present

## 2019-08-23 DIAGNOSIS — R911 Solitary pulmonary nodule: Secondary | ICD-10-CM | POA: Diagnosis not present

## 2019-08-23 DIAGNOSIS — E039 Hypothyroidism, unspecified: Secondary | ICD-10-CM | POA: Diagnosis not present

## 2019-08-23 DIAGNOSIS — I700 Atherosclerosis of aorta: Secondary | ICD-10-CM | POA: Diagnosis not present

## 2019-08-23 DIAGNOSIS — Z6832 Body mass index (BMI) 32.0-32.9, adult: Secondary | ICD-10-CM | POA: Diagnosis not present

## 2019-08-24 ENCOUNTER — Ambulatory Visit (INDEPENDENT_AMBULATORY_CARE_PROVIDER_SITE_OTHER): Payer: BC Managed Care – PPO

## 2019-08-24 ENCOUNTER — Ambulatory Visit: Payer: BC Managed Care – PPO | Admitting: Orthopedic Surgery

## 2019-08-24 ENCOUNTER — Other Ambulatory Visit: Payer: Self-pay

## 2019-08-24 DIAGNOSIS — Z96651 Presence of right artificial knee joint: Secondary | ICD-10-CM

## 2019-08-26 ENCOUNTER — Other Ambulatory Visit: Payer: Self-pay

## 2019-08-26 ENCOUNTER — Ambulatory Visit (INDEPENDENT_AMBULATORY_CARE_PROVIDER_SITE_OTHER): Payer: BC Managed Care – PPO | Admitting: Physical Therapy

## 2019-08-26 DIAGNOSIS — R2689 Other abnormalities of gait and mobility: Secondary | ICD-10-CM

## 2019-08-26 DIAGNOSIS — M25561 Pain in right knee: Secondary | ICD-10-CM

## 2019-08-26 DIAGNOSIS — R600 Localized edema: Secondary | ICD-10-CM

## 2019-08-26 DIAGNOSIS — M25661 Stiffness of right knee, not elsewhere classified: Secondary | ICD-10-CM | POA: Diagnosis not present

## 2019-08-26 NOTE — Therapy (Signed)
[facility] Physical Therapy [address] [place], [place], [address] Phone: [phone]   Fax:  [phone]  Physical Therapy Evaluation  Patient Details  Name: Caleb Smith MRN: [id number] Date of Birth: 22-Jul-1950 Referring Provider (PT): Marlou Sa, MD   Encounter Date: 08/26/2019  PT End of Session - 08/26/19 1647    Visit Number  1    Number of Visits  8    Date for PT Re-Evaluation  09/23/19    PT Start Time  [id number]    PT Stop Time  1620    PT Time Calculation (min)  50 min    Activity Tolerance  Patient tolerated treatment well       Past Medical History:  Diagnosis Date  . Arthritis   . Coronary artery calcification seen on CAT scan 08/21/2017  . GERD (gastroesophageal reflux disease) 1995   s/p esoph dilation for stricture  . HLD (hyperlipidemia)   . Hypothyroidism   . Obesity 07/14/2014    Past Surgical History:  Procedure Laterality Date  . CARDIOVASCULAR STRESS TEST  07/2017   low risk study ([place])  . COLONOSCOPY  03/2003   small sigmoid polyp, rpt 5 yrs [name])  . ESOPHAGOGASTRODUODENOSCOPY  1995   s/p esophageal dilation  . FACIAL RECONSTRUCTION SURGERY  1990s   cow headbutted him  . FINGER SURGERY Left teenager   reattachment of 2nd,3rd,4th  . TONSILLECTOMY    . 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 TOTAL KNEE ARTHROPLASTY Right 08/09/2019   Procedure: RIGHT TOTAL KNEE ARTHROPLASTY-CEMENTED;  Surgeon: Meredith Pel, MD;  Location: [facility];  Service: Orthopedics;  Laterality: Right;    There were no vitals filed for this visit.   Subjective Assessment - 08/26/19 1537    Subjective  He had Rt TKA 08/09/19, had HHPT unitl 08/24/19. Has been doing his exercises and stretches and having only mild pain but having some swelling in his knee. 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  Yes    Pain Score  1     Pain Location  Knee    Pain  Orientation  Right    Pain Descriptors / Indicators  Aching    Pain Type  Surgical pain    Pain Radiating Towards  denies    Aggravating Factors   steps, prolonged standing or walking    Pain Relieving Factors  ice, rest         [facility] PT Assessment - 08/26/19 0001      Assessment   Medical Diagnosis  S/p right TKA 08/09/19    Referring Provider (PT)  Marlou Sa, MD      Precautions   Precautions  None      Balance Screen   Has the patient fallen in the past 6 months  No      [place] residence    Additional Comments  3 steps      Prior Function   Level of Independence  Independent    Vocation  Full time employment    Pharmacist, hospital, gets in and out of trunk     Leisure  farm      Cognition   Overall Cognitive Status  Within Functional Limits for tasks assessed      Observation/Other Assessments   Observations  moderate edema, incision site well healing.  Sensation   Light Touch  Appears Intact      ROM / Strength   AROM / PROM / Strength  AROM;Strength;PROM      AROM   AROM Assessment Site  Knee    Right/Left Knee  Right    Right Knee Extension  8    Right Knee Flexion  112      PROM   PROM Assessment Site  Knee    Right/Left Knee  Right    Right Knee Extension  5    Right Knee Flexion  120      Strength   Overall Strength Comments  hip and knee strength overall 4+/5 grossly on Rt leg      Palpation   Patella mobility  good      Transfers   Transfers  Independent with all Transfers      Ambulation/Gait   Gait Comments  using SPC currently but did not need one PLOF, decreased stance time and hip/knee ROM with Rt LE                Objective measurements completed on examination: See above findings.      [facility] Adult PT Treatment/Exercise - 08/26/19 0001      Exercises   Exercises  Knee/Hip      Knee/Hip Exercises: Aerobic   Recumbent Bike  5 min, full revolutions       Modalities   Modalities  Vasopneumatic      Vasopneumatic   Number Minutes Vasopneumatic   10 minutes    Vasopnuematic Location   Knee    Vasopneumatic Pressure  High    Vasopneumatic Temperature   34      Manual Therapy   Manual therapy comments  Rt knee PROM flexion and extension, manual hamstring stretching             PT Education - 08/26/19 1646    Education Details  HEP review, POC    Person(s) Educated  Patient    Methods  Explanation;Demonstration;Verbal cues;Handout    Comprehension  Verbalized understanding          PT Long Term Goals - 08/26/19 1651      PT LONG TERM GOAL #1   Title  Pt will be I and compliant with HEP. (target for all goals 4 weeks 09/23/19)    Status  New      PT LONG TERM GOAL #2   Title  Pt will improve Rt knee ROM 0-120 deg to improve function.    Status  New      PT LONG TERM GOAL #3   Title  Pt will ambulate community distances without significant gait deviations or pain.    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    Status  New             Plan - 08/26/19 1648    Clinical Impression Statement  Pt presents with Rt knee pain/stiffness/edema/weakness S/P TKA 08/09/19. Overall he is doing quite well and only has mild deficits. 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    Examination-Activity Limitations  Lift;Stand;Stairs;Squat;Carry;Locomotion Level    Examination-Participation Restrictions  Cleaning;Community Activity;Driving;Yard Work    Stability/Clinical Decision Making  Stable/Uncomplicated    Designer, jewellery  Low    Rehab Potential  Excellent    PT Frequency  2x / week    PT Duration  4 weeks    PT Treatment/Interventions  ADLs/Self Care Home Management;Cryotherapy;Paediatric nurse;Therapeutic activities;Therapeutic exercise;Balance training;Neuromuscular re-education;Manual techniques;Passive range of motion;Dry needling;Joint Manipulations;Taping;Vasopneumatic Device    PT Next Visit Plan  progress functional strength and standing tolerance as able, needs knee extenson ROM    PT Home Exercise Plan  Access Code: [email or web address] URL: [email or web address] Date: 08/26/2019 Prepared by: Elsie Ra  Exercises Sit to Stand without Arm Support- 10 reps- 1-2 sets- 2x daily- 6x weekly Single Leg Stance- 3-5 reps- 1 sets- 15-30 sec or as long as you can hold- 2x daily- 6x weekly    Consulted and Agree with Plan of Care  Patient       Patient will benefit from skilled therapeutic intervention in order to improve the following deficits and impairments:  Abnormal gait, Decreased endurance, Decreased activity tolerance, Difficulty walking, Impaired flexibility, Decreased range of motion, Pain  Visit Diagnosis: Acute pain of right knee  Stiffness of right knee, not elsewhere classified  Localized edema  Other abnormalities of gait and mobility     Problem List Patient Active Problem List   Diagnosis Date Noted  . Arthritis of right knee 08/09/2019  . Open wound of right chest wall 07/18/2019  . Advanced care planning/counseling discussion 11/13/2017  . Coronary artery calcification seen on CAT scan 08/21/2017  . Atherosclerosis of aorta ([place]) 07/03/2017  . Lung nodule, solitary 06/28/2017  . Pedal edema 11/05/2016  . Upper airway cough syndrome 06/26/2016  . Plantar fasciitis, right 10/30/2015  . Health maintenance examination 10/27/2014  . Lesion of nose 07/14/2014  . Obesity, Class I, BMI 30-34.9 07/14/2014  . Hypothyroidism   . HLD (hyperlipidemia)   . GERD (gastroesophageal reflux disease)     Debbe Odea, PT,DPT 08/26/2019, 4:54 PM  [facility] Physical Therapy [address] [place], [place],  [address] Phone: [phone]   Fax:  [phone]  Name: Caleb Smith MRN: [id number] Date of Birth: [birth year]

## 2019-08-26 NOTE — Patient Instructions (Signed)
Access Code: [email or web address]  URL: [email or web address]  Date: 08/26/2019  Prepared by: Elsie Ra   Exercises  Sit to Stand without Arm Support - 10 reps - 1-2 sets - 2x daily - 6x weekly  Single Leg Stance - 3-5 reps - 1 sets - 15-30 sec or as long as you can hold - 2x daily - 6x weekly  Instructed to add this to HEP HHPT gave him of heelslides, heel prop, mini squats, standing hip abd, flexion, retrowalking

## 2019-08-27 ENCOUNTER — Encounter: Payer: Self-pay | Admitting: Orthopedic Surgery

## 2019-08-27 NOTE — Progress Notes (Signed)
Post-Op Visit Note   Patient: Caleb Smith           Date of Birth: 13-Mar-1951           MRN: [id number] Visit Date: 08/24/2019 PCP: Ria Bush, MD   Assessment & Plan:  Chief Complaint:  Chief Complaint  Patient presents with  . Right Knee - Routine Post Op   Visit Diagnoses:  1. S/P total knee arthroplasty, right     Plan: Patient is a 69 year old male who presents s/p right total knee arthroplasty on 08/09/2019.  Patient notes that he is doing well overall.  He is up to 123 degrees on CPM machine.  He is not taking any medication for pain aside from the occasional Celebrex.  Home health physical therapy has gone well and he says that he is ready for outpatient physical therapy.  On exam he comes to about 3 degrees of extension and flexes to approximately 105 degrees.  His incision is healing well with no evidence of dehiscence.  He has no calf tenderness on exam and a negative Homans' sign.  Radiographs taken today of the right knee show a well aligned and in place prosthesis with no complicating features.  Plan to start physical therapy upstairs in this facility 3 times per week for 4 weeks.  He will return to the office in 3 weeks and we will determine his return to work status at that time.  He wants to return to work as soon as possible.  He has no upcoming dental work planned.  Follow-Up Instructions: No follow-ups on file.   Orders:  Orders Placed This Encounter  Procedures  . XR Knee 1-2 Views Right  . Ambulatory referral to Physical Therapy   No orders of the defined types were placed in this encounter.   Imaging: No results found.  PMFS History: Patient Active Problem List   Diagnosis Date Noted  . Arthritis of right knee 08/09/2019  . Open wound of right chest wall 07/18/2019  . Advanced care planning/counseling discussion 11/13/2017  . Coronary artery calcification seen on CAT scan 08/21/2017  . Atherosclerosis of aorta ([place]) 07/03/2017  . Lung  nodule, solitary 06/28/2017  . Pedal edema 11/05/2016  . Upper airway cough syndrome 06/26/2016  . Plantar fasciitis, right 10/30/2015  . Health maintenance examination 10/27/2014  . Lesion of nose 07/14/2014  . Obesity, Class I, BMI 30-34.9 07/14/2014  . Hypothyroidism   . HLD (hyperlipidemia)   . GERD (gastroesophageal reflux disease)    Past Medical History:  Diagnosis Date  . Arthritis   . Coronary artery calcification seen on CAT scan 08/21/2017  . GERD (gastroesophageal reflux disease) 1995   s/p esoph dilation for stricture  . HLD (hyperlipidemia)   . Hypothyroidism   . Obesity 07/14/2014    Family History  Problem Relation Age of Onset  . Cancer Mother        breast  . CAD Maternal Uncle 76       massive MI  . Suicidality Father        suicide  . Stroke Neg Hx   . Diabetes Neg Hx   . Hypertension Neg Hx     Past Surgical History:  Procedure Laterality Date  . CARDIOVASCULAR STRESS TEST  07/2017   low risk study ([facility])  . COLONOSCOPY  03/2003   small sigmoid polyp, rpt 5 yrs [name])  . ESOPHAGOGASTRODUODENOSCOPY  1995   s/p esophageal dilation  . [place]  cow headbutted him  . FINGER SURGERY Left teenager   reattachment of 2nd,3rd,4th  . TONSILLECTOMY    . 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 TOTAL KNEE ARTHROPLASTY Right 08/09/2019   Procedure: RIGHT TOTAL KNEE ARTHROPLASTY-CEMENTED;  Surgeon: Meredith Pel, MD;  Location: [facility];  Service: Orthopedics;  Laterality: Right;   Social History   Occupational History  . Not on file  Tobacco Use  . Smoking status: Never Smoker  . Smokeless tobacco: Former Systems developer    Types: Chew  Substance and Sexual Activity  . Alcohol use: Yes    Alcohol/week: 0.0 standard drinks    Comment: 2-3 beer daily  . Drug use: No  . Sexual activity: Not on file

## 2019-08-30 ENCOUNTER — Other Ambulatory Visit: Payer: Self-pay

## 2019-08-30 ENCOUNTER — Ambulatory Visit (INDEPENDENT_AMBULATORY_CARE_PROVIDER_SITE_OTHER): Payer: BC Managed Care – PPO | Admitting: Physical Therapy

## 2019-08-30 DIAGNOSIS — M25661 Stiffness of right knee, not elsewhere classified: Secondary | ICD-10-CM | POA: Diagnosis not present

## 2019-08-30 DIAGNOSIS — R2689 Other abnormalities of gait and mobility: Secondary | ICD-10-CM | POA: Diagnosis not present

## 2019-08-30 DIAGNOSIS — R600 Localized edema: Secondary | ICD-10-CM | POA: Diagnosis not present

## 2019-08-30 DIAGNOSIS — M25561 Pain in right knee: Secondary | ICD-10-CM

## 2019-08-30 NOTE — Therapy (Signed)
[facility] [address] [place], [place], [address] Phone: [phone]   Fax:  [phone]  Physical Therapy Treatment  Patient Details  Name: Caleb Smith MRN: [id number] Date of Birth: 04/19/1951 Referring Provider (PT): Marlou Sa, MD   Encounter Date: 08/30/2019  PT End of Session - 08/30/19 1528    Visit Number  2    Number of Visits  8    Date for PT Re-Evaluation  09/23/19    PT Start Time  1400    PT Stop Time  1445    PT Time Calculation (min)  45 min    Activity Tolerance  Patient tolerated treatment well       Past Medical History:  Diagnosis Date  . Arthritis   . Coronary artery calcification seen on CAT scan 08/21/2017  . GERD (gastroesophageal reflux disease) 1995   s/p esoph dilation for stricture  . HLD (hyperlipidemia)   . Hypothyroidism   . Obesity 07/14/2014    Past Surgical History:  Procedure Laterality Date  . CARDIOVASCULAR STRESS TEST  07/2017   low risk study ([facility])  . COLONOSCOPY  03/2003   small sigmoid polyp, rpt 5 yrs [name])  . ESOPHAGOGASTRODUODENOSCOPY  1995   s/p esophageal dilation  . FACIAL RECONSTRUCTION SURGERY  1990s   cow headbutted him  . FINGER SURGERY Left teenager   reattachment of 2nd,3rd,4th  . TONSILLECTOMY    . 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 TOTAL KNEE ARTHROPLASTY Right 08/09/2019   Procedure: RIGHT TOTAL KNEE ARTHROPLASTY-CEMENTED;  Surgeon: Meredith Pel, MD;  Location: [facility];  Service: Orthopedics;  Laterality: Right;    There were no vitals filed for this visit.  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    Hamstring Curl  Right;20 reps    Hamstring Limitations  green band    Sit to Sand  10 reps   Rt leg a little more posterior for more wt shift     Manual Therapy   Manual therapy comments  Rt knee PROM flexion and extension, manual hamstring stretching                  PT Long Term Goals - 08/26/19 1651      PT LONG TERM GOAL #1   Title  Pt will be I and compliant with HEP. (target for all goals 4 weeks 09/23/19)    Status  New      PT LONG TERM GOAL #2  Title  Pt will improve Rt knee ROM 0-120 deg to improve function.    Status  New      PT LONG TERM GOAL #3   Title  Pt will ambulate community distances without significant gait deviations or pain.    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    Status  New            Plan - 08/30/19 1529    Clinical Impression Statement  He is progressing excellent with PT, had improvements in ROM and was able to progress his strenght progression. He is already ambulating without AD for community distances and going up/down stairs. He will likely only need about 2-3 more weeks of PT.    Examination-Activity Limitations  Lift;Stand;Stairs;Squat;Carry;Locomotion Level    Examination-Participation Restrictions  Cleaning;Community Activity;Driving;Yard Work    Stability/Clinical Decision Making  Stable/Uncomplicated    Rehab Potential  Excellent    PT Frequency  2x / week    PT Duration  4 weeks    PT Treatment/Interventions  ADLs/Self Care Home  Management;Cryotherapy;English as a second language teacher;Therapeutic activities;Therapeutic exercise;Balance training;Neuromuscular re-education;Manual techniques;Passive range of motion;Dry needling;Joint Manipulations;Taping;Vasopneumatic Device    PT Next Visit Plan  progress functional strength and standing tolerance as able, needs knee extenson ROM    PT Home Exercise Plan  Access Code: [email or web address] URL: [email or web address] Date: 08/26/2019 Prepared by: Elsie Ra  Exercises Sit to Stand without Arm Support- 10 reps- 1-2 sets- 2x daily- 6x weekly Single Leg Stance- 3-5 reps- 1 sets- 15-30 sec or as long as you can hold- 2x daily- 6x weekly    Consulted and Agree with Plan of Care  Patient       Patient will benefit from skilled therapeutic intervention in order to improve the following deficits and impairments:  Abnormal gait, Decreased endurance, Decreased activity tolerance, Difficulty walking, Impaired flexibility, Decreased range of motion, Pain  Visit Diagnosis: Acute pain of right knee  Stiffness of right knee, not elsewhere classified  Localized edema  Other abnormalities of gait and mobility     Problem List Patient Active Problem List   Diagnosis Date Noted  . Arthritis of right knee 08/09/2019  . Open wound of right chest wall 07/18/2019  . Advanced care planning/counseling discussion 11/13/2017  . Coronary artery calcification seen on CAT scan 08/21/2017  . Atherosclerosis of aorta ([place]) 07/03/2017  . Lung nodule, solitary 06/28/2017  . Pedal edema 11/05/2016  . Upper airway cough syndrome 06/26/2016  . Plantar fasciitis, right 10/30/2015  . Health maintenance examination 10/27/2014  . Lesion of nose 07/14/2014  . Obesity, Class I, BMI 30-34.9 07/14/2014  . Hypothyroidism   . HLD (hyperlipidemia)   . GERD (gastroesophageal reflux disease)     Debbe Odea, PT,DPT 08/30/2019, 3:33 PM  [facility] [address] [place], [place], [address] Phone: [phone]   Fax:  [phone]  Name: Caleb Smith MRN: [id number] Date of Birth: [birth year]

## 2019-08-31 ENCOUNTER — Other Ambulatory Visit: Payer: Self-pay | Admitting: Surgical

## 2019-08-31 NOTE — Telephone Encounter (Signed)
Ok to rf? 

## 2019-09-01 ENCOUNTER — Other Ambulatory Visit: Payer: Self-pay | Admitting: Surgical

## 2019-09-01 NOTE — Telephone Encounter (Signed)
Pls advise. Thanks.  

## 2019-09-02 ENCOUNTER — Other Ambulatory Visit: Payer: Self-pay

## 2019-09-02 ENCOUNTER — Ambulatory Visit (INDEPENDENT_AMBULATORY_CARE_PROVIDER_SITE_OTHER): Payer: BC Managed Care – PPO | Admitting: Physical Therapy

## 2019-09-02 ENCOUNTER — Encounter: Payer: Self-pay | Admitting: Physical Therapy

## 2019-09-02 DIAGNOSIS — R2689 Other abnormalities of gait and mobility: Secondary | ICD-10-CM | POA: Diagnosis not present

## 2019-09-02 DIAGNOSIS — M25561 Pain in right knee: Secondary | ICD-10-CM

## 2019-09-02 DIAGNOSIS — R600 Localized edema: Secondary | ICD-10-CM

## 2019-09-02 DIAGNOSIS — M25661 Stiffness of right knee, not elsewhere classified: Secondary | ICD-10-CM | POA: Diagnosis not present

## 2019-09-02 NOTE — Therapy (Signed)
[facility] [address] [place], [place], [address] Phone: [phone]   Fax:  [phone]  Physical Therapy Treatment  Patient Details  Name: Caleb Smith MRN: [id number] Date of Birth: 1951/05/21 Referring Provider (PT): Marlou Sa, MD   Encounter Date: 09/02/2019  PT End of Session - 09/02/19 1400    Visit Number  3    Number of Visits  8    Date for PT Re-Evaluation  09/23/19    Authorization Type  BCBS    PT Start Time  [id number]    PT Stop Time  1440    PT Time Calculation (min)  42 min    Activity Tolerance  Patient tolerated treatment well    Behavior During Therapy  [facility] for tasks assessed/performed       Past Medical History:  Diagnosis Date  . Arthritis   . Coronary artery calcification seen on CAT scan 08/21/2017  . GERD (gastroesophageal reflux disease) 1995   s/p esoph dilation for stricture  . HLD (hyperlipidemia)   . Hypothyroidism   . Obesity 07/14/2014    Past Surgical History:  Procedure Laterality Date  . CARDIOVASCULAR STRESS TEST  07/2017   low risk study ([facility])  . COLONOSCOPY  03/2003   small sigmoid polyp, rpt 5 yrs [name])  . ESOPHAGOGASTRODUODENOSCOPY  1995   s/p esophageal dilation  . FACIAL RECONSTRUCTION SURGERY  1990s   cow headbutted him  . FINGER SURGERY Left teenager   reattachment of 2nd,3rd,4th  . TONSILLECTOMY    . 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 TOTAL KNEE ARTHROPLASTY Right 08/09/2019   Procedure: RIGHT TOTAL KNEE ARTHROPLASTY-CEMENTED;  Surgeon: Meredith Pel, MD;  Location: [facility];  Service: Orthopedics;  Laterality: Right;    There were no vitals filed for this visit.  Subjective Assessment - 09/02/19 1358    Subjective  Patient reports he is doing well. He states when he was riding in the car today it felt like his heartbeat was in his knee 3 times but this resolved. He notes he cannot get comfortable in the bed at night.    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  No/denies         [facility] PT Assessment - 09/02/19 0001      AROM   Right Knee Extension  4    Right Knee Flexion  115      PROM   Right Knee Flexion  120                   [facility] Adult PT Treatment/Exercise - 09/02/19 0001      Exercises   Exercises  Knee/Hip      Knee/Hip Exercises: Aerobic   Nustep  L6 x 5 min (LE only)      Knee/Hip Exercises: Machines for Strengthening   Cybex Leg Press  DL: 75# x10, 100# x20, 125# x20; SL: 75# 2x20 each      Knee/Hip Exercises: Standing   Heel Raises  2 sets;20 reps    Heel Raises Limitations  toes elevated    Hip Abduction  2 sets;20 reps    Abduction Limitations  red band at knees 1st set, at ankles 2nd set    Hip Extension  2 sets;20 reps    Extension Limitations  red band at knees 1st set, at ankles 2nd set    Lateral Step Up  20 reps;Both;Step Height: 8"  Forward Step Up  20 reps;Both;Step Height: 8"    SLS  2x20 sec each      Knee/Hip Exercises: Seated   Long Arc Quad  2 sets;20 reps    Long Arc Quad Weight  6 lbs.    Hamstring Curl  2 sets;20 reps    Hamstring Limitations  green band      Manual Therapy   Manual Therapy  Passive ROM;Joint mobilization    Joint Mobilization  Patellar and tibiofemoral mobs in all directions    Passive ROM  Knee flexion and extension in supine             PT Education - 09/02/19 1400    Education Details  HEP    Person(s) Educated  Patient    Methods  Explanation;Demonstration;Verbal cues    Comprehension  Verbalized understanding;Returned demonstration;Verbal cues required;Need further instruction          PT Long Term Goals - 08/26/19 1651      PT LONG TERM GOAL #1   Title  Pt will be I and compliant with HEP. (target for all goals 4 weeks 09/23/19)    Status  New      PT LONG TERM GOAL #2   Title  Pt will improve Rt knee ROM 0-120 deg to improve function.    Status  New      PT LONG TERM GOAL #3    Title  Pt will ambulate community distances without significant gait deviations or pain.    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    Status  New            Plan - 09/02/19 1401    Clinical Impression Statement  Patient is progressing well with his range of motion and strength. He did not report any increased pain this visit. He continues to exhibit limitation in balance and knee motion but this is improving. 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    PT Treatment/Interventions  ADLs/Self Care Home Management;Cryotherapy;English as a second language teacher;Therapeutic activities;Therapeutic exercise;Balance training;Neuromuscular re-education;Manual techniques;Passive range of motion;Dry needling;Joint Manipulations;Taping;Vasopneumatic Device    PT Next Visit Plan  progress functional strength and standing tolerance as able, needs knee extenson ROM    PT Home Exercise Plan  Access Code: [email or web address] URL: [email or web address] Date: 08/26/2019 Prepared by: Elsie Ra  Exercises Sit to Stand without Arm Support- 10 reps- 1-2 sets- 2x daily- 6x weekly Single Leg Stance- 3-5 reps- 1 sets- 15-30 sec or as long as you can hold- 2x daily- 6x weekly    Consulted and Agree with Plan of Care  Patient       Patient will benefit from skilled therapeutic intervention in order to improve the following deficits and impairments:  Abnormal gait, Decreased endurance, Decreased activity tolerance, Difficulty walking, Impaired flexibility, Decreased range of motion, Pain  Visit Diagnosis: Acute pain of right knee  Stiffness of right knee, not elsewhere classified  Localized edema  Other abnormalities of gait and mobility     Problem List Patient Active Problem  List   Diagnosis Date Noted  . Arthritis of right knee 08/09/2019  . Open wound of right chest wall 07/18/2019  . Advanced care planning/counseling discussion 11/13/2017  . Coronary artery calcification seen on CAT  scan 08/21/2017  . Atherosclerosis of aorta ([place]) 07/03/2017  . Lung nodule, solitary 06/28/2017  . Pedal edema 11/05/2016  . Upper airway cough syndrome 06/26/2016  . Plantar fasciitis, right 10/30/2015  . Health maintenance examination 10/27/2014  . Lesion of nose 07/14/2014  . Obesity, Class I, BMI 30-34.9 07/14/2014  . Hypothyroidism   . HLD (hyperlipidemia)   . GERD (gastroesophageal reflux disease)     Hilda Blades, PT, DPT, LAT, ATC 09/02/19  2:41 PM    [facility] [address] [place], [place], [address] Phone: [phone]   Fax:  [phone]  Name: Caleb Smith MRN: [id number] Date of Birth: Feb 22, 1951

## 2019-09-06 ENCOUNTER — Other Ambulatory Visit: Payer: Self-pay

## 2019-09-06 ENCOUNTER — Ambulatory Visit (INDEPENDENT_AMBULATORY_CARE_PROVIDER_SITE_OTHER): Payer: BC Managed Care – PPO | Admitting: Physical Therapy

## 2019-09-06 DIAGNOSIS — M25561 Pain in right knee: Secondary | ICD-10-CM | POA: Diagnosis not present

## 2019-09-06 DIAGNOSIS — R600 Localized edema: Secondary | ICD-10-CM | POA: Diagnosis not present

## 2019-09-06 DIAGNOSIS — R2689 Other abnormalities of gait and mobility: Secondary | ICD-10-CM

## 2019-09-06 DIAGNOSIS — M25661 Stiffness of right knee, not elsewhere classified: Secondary | ICD-10-CM

## 2019-09-06 NOTE — Therapy (Signed)
[facility] [address] [place], [place], [address] Phone: [phone]   Fax:  [phone]  Physical Therapy Treatment  Patient Details  Name: Caleb Smith MRN: [id number] Date of Birth: March 06, 1951 Referring Provider (PT): Marlou Sa, MD   Encounter Date: 09/06/2019  PT End of Session - 09/06/19 1228    Visit Number  4    Number of Visits  8    Date for PT Re-Evaluation  09/23/19    Authorization Type  BCBS    PT Start Time  [id number]    PT Stop Time  1222    PT Time Calculation (min)  38 min    Activity Tolerance  Patient tolerated treatment well    Behavior During Therapy  [facility] for tasks assessed/performed       Past Medical History:  Diagnosis Date  . Arthritis   . Coronary artery calcification seen on CAT scan 08/21/2017  . GERD (gastroesophageal reflux disease) 1995   s/p esoph dilation for stricture  . HLD (hyperlipidemia)   . Hypothyroidism   . Obesity 07/14/2014    Past Surgical History:  Procedure Laterality Date  . CARDIOVASCULAR STRESS TEST  07/2017   low risk study ([facility])  . COLONOSCOPY  03/2003   small sigmoid polyp, rpt 5 yrs [name])  . ESOPHAGOGASTRODUODENOSCOPY  1995   s/p esophageal dilation  . FACIAL RECONSTRUCTION SURGERY  1990s   cow headbutted him  . FINGER SURGERY Left teenager   reattachment of 2nd,3rd,4th  . TONSILLECTOMY    . 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 TOTAL KNEE ARTHROPLASTY Right 08/09/2019   Procedure: RIGHT TOTAL KNEE ARTHROPLASTY-CEMENTED;  Surgeon: Meredith Pel, MD;  Location: [facility];  Service: Orthopedics;  Laterality: Right;    There were no vitals filed for this visit.  Subjective Assessment - 09/06/19 1224    Subjective  knee is a little stiff and hard to get comfortable at night but other than that doing well ,denies pain upon arrival    Patient Stated Goals  back to work full time and back to [facility]  Treatment/Exercise - 09/06/19 0001      Knee/Hip Exercises: Stretches   Active Hamstring Stretch  Right;3 reps;30 seconds    Active Hamstring Stretch Limitations  standing with foot on step    Knee: Self-Stretch Limitations  standing lunge stretch with foot on step 5 sec X 15 reps      Knee/Hip Exercises: Aerobic   Recumbent Bike  6 min L5 for leg portion of UBE      Knee/Hip Exercises: Machines for Strengthening   Cybex Leg Press  DL 2X20 reps 125 lbs, then dropped to 75 lbs for Rt leg only 2X20 reps      Knee/Hip Exercises: Standing   Lateral Step Up  20 reps;Both;Step Height: 8"    Forward Step Up  20 reps;Both;Step Height: 8"    Other Standing Knee Exercises  SLS 3X 20 sec on Rt, bilat marching on airex pad X 15 reps ea, foam balance beam for lateral walking and fwd walking up/down  X5 ea      Manual Therapy   Joint Mobilization  Patellar and tibiofemoral mobs in all directions    Passive ROM  Knee flexion and extension in supine  PT Long Term Goals - 08/26/19 1651      PT LONG TERM GOAL #1   Title  Pt will be I and compliant with HEP. (target for all goals 4 weeks 09/23/19)    Status  New      PT LONG TERM GOAL #2   Title  Pt will improve Rt knee ROM 0-120 deg to improve function.    Status  New      PT LONG TERM GOAL #3   Title  Pt will ambulate community distances without significant gait deviations or pain.    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    Status  New            Plan - 09/06/19 1228    Clinical Impression Statement  He continues to progress well with PT, ROM is doing well except mild extension stiffness. Progressed balance challenges today as he also has mild limitation with Rt SLS. Continue POC    PT Treatment/Interventions  ADLs/Self Care Home Management;Cryotherapy;Paediatric nurse;Therapeutic activities;Therapeutic exercise;Balance training;Neuromuscular re-education;Manual techniques;Passive range of motion;Dry needling;Joint Manipulations;Taping;Vasopneumatic Device    PT Next Visit Plan  progress functional strength and standing tolerance as able, needs knee extenson ROM    PT Home Exercise Plan  Access Code: [email or web address] URL: [email or web address] Date: 08/26/2019 Prepared by: Elsie Ra  Exercises Sit to Stand without Arm Support- 10 reps- 1-2 sets- 2x daily- 6x weekly Single Leg Stance- 3-5 reps- 1 sets- 15-30 sec or as long as you can hold- 2x daily- 6x weekly    Consulted and Agree with Plan of Care  Patient       Patient will benefit from skilled therapeutic intervention in order to improve the following deficits and impairments:  Abnormal gait, Decreased endurance, Decreased activity tolerance, Difficulty walking, Impaired flexibility, Decreased range of motion, Pain  Visit Diagnosis: Acute pain of right knee  Stiffness of right knee, not elsewhere classified  Localized edema  Other abnormalities of gait and mobility     Problem List Patient Active Problem List   Diagnosis Date Noted  . Arthritis of right knee 08/09/2019  . Open wound of right chest wall 07/18/2019  . Advanced care planning/counseling discussion 11/13/2017  . Coronary artery calcification seen on CAT scan 08/21/2017  . Atherosclerosis of aorta ([place]) 07/03/2017  . Lung nodule, solitary 06/28/2017  . Pedal edema 11/05/2016  . Upper airway cough syndrome 06/26/2016  . Plantar fasciitis, right 10/30/2015  . Health maintenance examination 10/27/2014  . Lesion of nose 07/14/2014  . Obesity, Class I, BMI 30-34.9 07/14/2014  . Hypothyroidism   . HLD (hyperlipidemia)   . GERD (gastroesophageal reflux disease)     Debbe Odea, PT,DPT 09/06/2019, 12:30 PM  [facility] [address] [place], [place],  [address] Phone: [phone]   Fax:  [phone]  Name: Jazz Gale MRN: [id number] Date of Birth: [birth year]

## 2019-09-09 ENCOUNTER — Other Ambulatory Visit: Payer: Self-pay

## 2019-09-09 ENCOUNTER — Ambulatory Visit (INDEPENDENT_AMBULATORY_CARE_PROVIDER_SITE_OTHER): Payer: BC Managed Care – PPO | Admitting: Physical Therapy

## 2019-09-09 DIAGNOSIS — M25661 Stiffness of right knee, not elsewhere classified: Secondary | ICD-10-CM

## 2019-09-09 DIAGNOSIS — R2689 Other abnormalities of gait and mobility: Secondary | ICD-10-CM

## 2019-09-09 DIAGNOSIS — M25561 Pain in right knee: Secondary | ICD-10-CM

## 2019-09-09 DIAGNOSIS — R600 Localized edema: Secondary | ICD-10-CM

## 2019-09-09 NOTE — Therapy (Signed)
[facility] Physical Therapy [address] [place], [place], [address] Phone: [phone]   Fax:  [phone]  Physical Therapy Treatment  Patient Details  Name: Caleb Smith MRN: [id number] Date of Birth: 02-Oct-1950 Referring Provider (PT): Marlou Sa, MD   Encounter Date: 09/09/2019  PT End of Session - 09/09/19 [id number]    Visit Number  5    Number of Visits  8    Date for PT Re-Evaluation  09/23/19    Authorization Type  BCBS    PT Start Time  [id number]    PT Stop Time  1225    PT Time Calculation (min)  40 min    Activity Tolerance  Patient tolerated treatment well    Behavior During Therapy  [facility] for tasks assessed/performed       Past Medical History:  Diagnosis Date  . Arthritis   . Coronary artery calcification seen on CAT scan 08/21/2017  . GERD (gastroesophageal reflux disease) 1995   s/p esoph dilation for stricture  . HLD (hyperlipidemia)   . Hypothyroidism   . Obesity 07/14/2014    Past Surgical History:  Procedure Laterality Date  . CARDIOVASCULAR STRESS TEST  07/2017   low risk study ([facility])  . COLONOSCOPY  03/2003   small sigmoid polyp, rpt 5 yrs [name])  . ESOPHAGOGASTRODUODENOSCOPY  1995   s/p esophageal dilation  . FACIAL RECONSTRUCTION SURGERY  1990s   cow headbutted him  . FINGER SURGERY Left teenager   reattachment of 2nd,3rd,4th  . TONSILLECTOMY    . 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 TOTAL KNEE ARTHROPLASTY Right 08/09/2019   Procedure: RIGHT TOTAL KNEE ARTHROPLASTY-CEMENTED;  Surgeon: Meredith Pel, MD;  Location: [place];  Service: Orthopedics;  Laterality: Right;    There were no vitals filed for this visit.  Subjective Assessment - 09/09/19 1316    Subjective  knee is stiff but just in the back. some soreness from increased activity but its doing okay.    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                       [facility] Adult PT Treatment/Exercise - 09/09/19  0001      Knee/Hip Exercises: Stretches   Active Hamstring Stretch  Right;3 reps;30 seconds    Active Hamstring Stretch Limitations  standing with foot on step    Knee: Self-Stretch Limitations  standing lunge stretch with foot on step 5 sec X 15 reps    Other Knee/Hip Stretches  prone lying for knee extension 5 min      Knee/Hip Exercises: Aerobic   Recumbent Bike  8 min L4       Knee/Hip Exercises: Machines for Strengthening   Cybex Leg Press  DL 2X15 reps 150 lbs, then dropped to 75 lbs for Rt leg only 2X20 reps      Knee/Hip Exercises: Standing   Lateral Step Up  Both;Step Height: 8";15 reps;Hand Hold: 0    Forward Step Up  Step Height: 8";15 reps;Right;Hand Hold: 0    Step Down  Right;15 reps;Step Height: 8";Hand Hold: 0    Other Standing Knee Exercises  SLS 3X 15 sec on Rt on airex pad, bilat marching on airex pad X 15 reps ea, foam pad feet together and eyes closed 3X30 sec, tandem walk up/down in bars X 4 reps      Manual Therapy   Manual therapy comments  STM/IASTM to Rt H.S. gastroc  in prone lying with leg off EOB, then passive stretching for hamstiring and quad 30 sec X 2 ea                  PT Long Term Goals - 08/26/19 1651      PT LONG TERM GOAL #1   Title  Pt will be I and compliant with HEP. (target for all goals 4 weeks 09/23/19)    Status  New      PT LONG TERM GOAL #2   Title  Pt will improve Rt knee ROM 0-120 deg to improve function.    Status  New      PT LONG TERM GOAL #3   Title  Pt will ambulate community distances without significant gait deviations or pain.    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    Status  New            Plan - 09/09/19 1318    Clinical Impression Statement  Progressed strength program with good tolerance and without complaints. Good knee flexion ROM but missing slight extension ROM. He may be ready for DC by the end of  next week.    PT Treatment/Interventions  ADLs/Self Care Home Management;Cryotherapy;English as a second language teacher;Therapeutic activities;Therapeutic exercise;Balance training;Neuromuscular re-education;Manual techniques;Passive range of motion;Dry needling;Joint Manipulations;Taping;Vasopneumatic Device    PT Next Visit Plan  progress functional strength and standing tolerance as able, needs knee extenson ROM    PT Home Exercise Plan  Access Code: [email or web address] URL: [email or web address] Date: 08/26/2019 Prepared by: Elsie Ra  Exercises Sit to Stand without Arm Support- 10 reps- 1-2 sets- 2x daily- 6x weekly Single Leg Stance- 3-5 reps- 1 sets- 15-30 sec or as long as you can hold- 2x daily- 6x weekly    Consulted and Agree with Plan of Care  Patient       Patient will benefit from skilled therapeutic intervention in order to improve the following deficits and impairments:  Abnormal gait, Decreased endurance, Decreased activity tolerance, Difficulty walking, Impaired flexibility, Decreased range of motion, Pain  Visit Diagnosis: Acute pain of right knee  Stiffness of right knee, not elsewhere classified  Localized edema  Other abnormalities of gait and mobility     Problem List Patient Active Problem List   Diagnosis Date Noted  . Arthritis of right knee 08/09/2019  . Open wound of right chest wall 07/18/2019  . Advanced care planning/counseling discussion 11/13/2017  . Coronary artery calcification seen on CAT scan 08/21/2017  . Atherosclerosis of aorta ([place]) 07/03/2017  . Lung nodule, solitary 06/28/2017  . Pedal edema 11/05/2016  . Upper airway cough syndrome 06/26/2016  . Plantar fasciitis, right 10/30/2015  . Health maintenance examination 10/27/2014  . Lesion of nose 07/14/2014  . Obesity, Class I, BMI 30-34.9 07/14/2014  . Hypothyroidism   . HLD (hyperlipidemia)   . GERD (gastroesophageal reflux disease)      Debbe Odea, PT,DPT 09/09/2019, 1:21 PM  [facility] Physical Therapy [address] [place], [place], [address] Phone: [phone]   Fax:  [phone]  Name: Caleb Smith MRN: [id number] Date of Birth: 07-31-1950

## 2019-09-13 ENCOUNTER — Ambulatory Visit (INDEPENDENT_AMBULATORY_CARE_PROVIDER_SITE_OTHER): Payer: BC Managed Care – PPO | Admitting: Physical Therapy

## 2019-09-13 ENCOUNTER — Other Ambulatory Visit: Payer: Self-pay

## 2019-09-13 DIAGNOSIS — R600 Localized edema: Secondary | ICD-10-CM

## 2019-09-13 DIAGNOSIS — M25561 Pain in right knee: Secondary | ICD-10-CM | POA: Diagnosis not present

## 2019-09-13 DIAGNOSIS — R2689 Other abnormalities of gait and mobility: Secondary | ICD-10-CM | POA: Diagnosis not present

## 2019-09-13 DIAGNOSIS — M25661 Stiffness of right knee, not elsewhere classified: Secondary | ICD-10-CM

## 2019-09-13 NOTE — Therapy (Signed)
[facility] Physical Therapy [address] [place], [place], [address] Phone: [phone]   Fax:  [phone]  Physical Therapy Treatment  Patient Details  Name: Caleb Smith MRN: [id number] Date of Birth: 16-Jun-1951 Referring Provider (PT): Marlou Sa, MD   Encounter Date: 09/13/2019  PT End of Session - 09/13/19 1229    Visit Number  6    Number of Visits  8    Date for PT Re-Evaluation  09/23/19    Authorization Type  BCBS    PT Start Time  1140    PT Stop Time  1220    PT Time Calculation (min)  40 min    Activity Tolerance  Patient tolerated treatment well    Behavior During Therapy  [facility] for tasks assessed/performed       Past Medical History:  Diagnosis Date  . Arthritis   . Coronary artery calcification seen on CAT scan 08/21/2017  . GERD (gastroesophageal reflux disease) 1995   s/p esoph dilation for stricture  . HLD (hyperlipidemia)   . Hypothyroidism   . Obesity 07/14/2014    Past Surgical History:  Procedure Laterality Date  . CARDIOVASCULAR STRESS TEST  07/2017   low risk study ([facility])  . COLONOSCOPY  03/2003   small sigmoid polyp, rpt 5 yrs [name])  . ESOPHAGOGASTRODUODENOSCOPY  1995   s/p esophageal dilation  . FACIAL RECONSTRUCTION SURGERY  1990s   cow headbutted him  . FINGER SURGERY Left teenager   reattachment of 2nd,3rd,4th  . TONSILLECTOMY    . 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 TOTAL KNEE ARTHROPLASTY Right 08/09/2019   Procedure: RIGHT TOTAL KNEE ARTHROPLASTY-CEMENTED;  Surgeon: Meredith Pel, MD;  Location: [place];  Service: Orthopedics;  Laterality: Right;    There were no vitals filed for this visit.  Subjective Assessment - 09/13/19 1220    Subjective  denies pain upon arrival, has some pain in the back of his knee at night but thats his only complaint.    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         [facility] PT Assessment - 09/13/19 0001      AROM   Right Knee  Extension  4    Right Knee Flexion  125      PROM   Right Knee Extension  0    Right Knee Flexion  130      Strength   Overall Strength Comments  Rt knee strength 5/5 MMT        [facility] Adult PT Treatment/Exercise - 09/13/19 0001      Knee/Hip Exercises: Stretches   Active Hamstring Stretch  Right;3 reps;30 seconds    Active Hamstring Stretch Limitations  standing with foot on step      Knee/Hip Exercises: Aerobic   Recumbent Bike  10 min L4       Knee/Hip Exercises: Machines for Strengthening   Cybex Leg Press  DL 2X20 reps 150 lbs, then dropped to 75 lbs for Rt leg only 2X20 reps      Knee/Hip Exercises: Standing   Lateral Step Up  Both;Step Height: 8";15 reps;Hand Hold: 0    Forward Step Up  Step Height: 8";15 reps;Right;Hand Hold: 0    Step Down  Right;15 reps;Step Height: 8";Hand Hold: 0    Other Standing Knee Exercises  lunges with one UE support X 15 bilat    Other Standing Knee Exercises  Deadlift from floor 20 lbs X 15 reps. Deep  squat with TRX 5 sec hold X 15 reps      Manual Therapy   Passive ROM  Knee flexion and extension in supine, manual H.S. stretching                  PT Long Term Goals - 09/13/19 1230      PT LONG TERM GOAL #1   Title  Pt will be I and compliant with HEP. (target for all goals 4 weeks 09/23/19)    Status  Achieved      PT LONG TERM GOAL #2   Title  Pt will improve Rt knee ROM 0-120 deg to improve function.    Status  Partially Met      PT LONG TERM GOAL #3   Title  Pt will ambulate community distances without significant gait deviations or pain.    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    Status  Achieved            Plan - 09/13/19 1229    Clinical Impression Statement  He is doing well, has excellent knee flexion ROM and overall strength. He does still lack 4 deg of extension AROM but this does not limit him  functionally. PT feels he can discharge at end of the week if MD agrees.    PT Treatment/Interventions  ADLs/Self Care Home Management;Cryotherapy;English as a second language teacher;Therapeutic activities;Therapeutic exercise;Balance training;Neuromuscular re-education;Manual techniques;Passive range of motion;Dry needling;Joint Manipulations;Taping;Vasopneumatic Device    PT Next Visit Plan  progress functional strength and standing tolerance as able, needs knee extenson ROM    PT Home Exercise Plan  Access Code: [email or web address] URL: [email or web address] Date: 08/26/2019 Prepared by: Elsie Ra  Exercises Sit to Stand without Arm Support- 10 reps- 1-2 sets- 2x daily- 6x weekly Single Leg Stance- 3-5 reps- 1 sets- 15-30 sec or as long as you can hold- 2x daily- 6x weekly    Consulted and Agree with Plan of Care  Patient       Patient will benefit from skilled therapeutic intervention in order to improve the following deficits and impairments:  Abnormal gait, Decreased endurance, Decreased activity tolerance, Difficulty walking, Impaired flexibility, Decreased range of motion, Pain  Visit Diagnosis: Acute pain of right knee  Stiffness of right knee, not elsewhere classified  Localized edema  Other abnormalities of gait and mobility     Problem List Patient Active Problem List   Diagnosis Date Noted  . Arthritis of right knee 08/09/2019  . Open wound of right chest wall 07/18/2019  . Advanced care planning/counseling discussion 11/13/2017  . Coronary artery calcification seen on CAT scan 08/21/2017  . Atherosclerosis of aorta ([place]) 07/03/2017  . Lung nodule, solitary 06/28/2017  . Pedal edema 11/05/2016  . Upper airway cough syndrome 06/26/2016  . Plantar fasciitis, right 10/30/2015  . Health maintenance examination 10/27/2014  . Lesion of nose 07/14/2014  . Obesity, Class I, BMI 30-34.9 07/14/2014  . Hypothyroidism   . HLD  (hyperlipidemia)   . GERD (gastroesophageal reflux disease)     Debbe Odea, PT,DPT 09/13/2019, 12:31 PM  [facility] Physical Therapy [address] [place], [place], [address] Phone: [phone]   Fax:  [phone]  Name: Caleb Smith MRN: [id number] Date of Birth: 01/11/1951

## 2019-09-15 ENCOUNTER — Other Ambulatory Visit: Payer: Self-pay

## 2019-09-15 ENCOUNTER — Ambulatory Visit: Payer: BC Managed Care – PPO | Admitting: Orthopedic Surgery

## 2019-09-15 DIAGNOSIS — Z96651 Presence of right artificial knee joint: Secondary | ICD-10-CM

## 2019-09-16 ENCOUNTER — Encounter: Payer: Self-pay | Admitting: Orthopedic Surgery

## 2019-09-16 ENCOUNTER — Ambulatory Visit (INDEPENDENT_AMBULATORY_CARE_PROVIDER_SITE_OTHER): Payer: BC Managed Care – PPO | Admitting: Physical Therapy

## 2019-09-16 DIAGNOSIS — R600 Localized edema: Secondary | ICD-10-CM | POA: Diagnosis not present

## 2019-09-16 DIAGNOSIS — M25661 Stiffness of right knee, not elsewhere classified: Secondary | ICD-10-CM | POA: Diagnosis not present

## 2019-09-16 DIAGNOSIS — M25561 Pain in right knee: Secondary | ICD-10-CM

## 2019-09-16 DIAGNOSIS — R2689 Other abnormalities of gait and mobility: Secondary | ICD-10-CM | POA: Diagnosis not present

## 2019-09-16 NOTE — Progress Notes (Signed)
Post-Op Visit Note   Patient: Caleb Smith           Date of Birth: [birth year]           MRN: [id number] Visit Date: 09/15/2019 PCP: Ria Bush, MD   Assessment & Plan:  Chief Complaint:  Chief Complaint  Patient presents with  . Right Knee - Follow-up   Visit Diagnoses:  1. S/P total knee arthroplasty, right     Plan: Caleb Smith is now 6 weeks out right total knee replacement.  He is doing well.  Still has mild pain at night.  No fevers or chills.  On exam he has 5 degrees from full extension to about 120 of flexion.  Trace effusion.  Collaterals are stable.  Not taking any medication.  Negative Homan negative tenderness to palpation along the calf.  I think it is okay for him to return to work on Monday.  Quad strength is excellent and he is using no assistive devices.  Overall Caleb Smith is done exceedingly well from his knee replacement.  I think is okay for him to transition to home exercise program regarding therapy.  Follow-up with me as needed.  Follow-Up Instructions: No follow-ups on file.   Orders:  No orders of the defined types were placed in this encounter.  No orders of the defined types were placed in this encounter.   Imaging: No results found.  PMFS History: Patient Active Problem List   Diagnosis Date Noted  . Arthritis of right knee 08/09/2019  . Open wound of right chest wall 07/18/2019  . Advanced care planning/counseling discussion 11/13/2017  . Coronary artery calcification seen on CAT scan 08/21/2017  . Atherosclerosis of aorta ([place]) 07/03/2017  . Lung nodule, solitary 06/28/2017  . Pedal edema 11/05/2016  . Upper airway cough syndrome 06/26/2016  . Plantar fasciitis, right 10/30/2015  . Health maintenance examination 10/27/2014  . Lesion of nose 07/14/2014  . Obesity, Class I, BMI 30-34.9 07/14/2014  . Hypothyroidism   . HLD (hyperlipidemia)   . GERD (gastroesophageal reflux disease)    Past Medical History:  Diagnosis Date  .  Arthritis   . Coronary artery calcification seen on CAT scan 08/21/2017  . GERD (gastroesophageal reflux disease) 1995   s/p esoph dilation for stricture  . HLD (hyperlipidemia)   . Hypothyroidism   . Obesity 07/14/2014    Family History  Problem Relation Age of Onset  . Cancer Mother        breast  . CAD Maternal Uncle 76       massive MI  . Suicidality Father        suicide  . Stroke Neg Hx   . Diabetes Neg Hx   . Hypertension Neg Hx     Past Surgical History:  Procedure Laterality Date  . CARDIOVASCULAR STRESS TEST  07/2017   low risk study ([facility])  . COLONOSCOPY  03/2003   small sigmoid polyp, rpt 5 yrs [name])  . ESOPHAGOGASTRODUODENOSCOPY  1995   s/p esophageal dilation  . FACIAL RECONSTRUCTION SURGERY  1990s   cow headbutted him  . FINGER SURGERY Left teenager   reattachment of 2nd,3rd,4th  . TONSILLECTOMY    . 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 TOTAL KNEE ARTHROPLASTY Right 08/09/2019   Procedure: RIGHT TOTAL KNEE ARTHROPLASTY-CEMENTED;  Surgeon: Meredith Pel, MD;  Location: [facility];  Service: Orthopedics;  Laterality: Right;   Social History   Occupational History  . Not on file  Tobacco Use  .  Smoking status: Never Smoker  . Smokeless tobacco: Former Systems developer    Types: Chew  Substance and Sexual Activity  . Alcohol use: Yes    Alcohol/week: 0.0 standard drinks    Comment: 2-3 beer daily  . Drug use: No  . Sexual activity: Not on file

## 2019-09-16 NOTE — Patient Instructions (Signed)
Access Code: [email or web address]: [email or web address]: 03/19/2021Prepared by: Aaron Edelman NelsonExercises  Sit to Stand without Arm Support - 2 x daily - 6 x weekly - 10 reps - 1-2 sets  Single Leg Stance - 2 x daily - 6 x weekly - 3-5 reps - 1 sets - 30 sec or as long as you can hold  Lunge with Counter Support - 2 x daily - 6 x weekly - 2-3 sets - 10 reps  Side Stepping with Resistance at Feet - 2 x daily - 6 x weekly - 10 reps - 3 sets  Standing Hamstring Curl with Resistance - 2 x daily - 6 x weekly - 10 reps - 1-2 sets  Hip Extension with Resistance Loop - 2 x daily - 6 x weekly - 10 reps - 1-2 sets

## 2019-09-16 NOTE — Therapy (Signed)
[facility] Physical Therapy [address] [place], [place], [address] Phone: [phone]   Fax:  [phone]  Physical Therapy Treatment/Discharge PHYSICAL THERAPY DISCHARGE SUMMARY  Visits from Start of Care: 7  Current functional level related to goals / functional outcomes: Back to baseline   Remaining deficits: none   Education / Equipment: HEP Plan: Patient agrees to discharge.  Patient goals were met. Patient is being discharged due to meeting the stated rehab goals.  ?????        Patient Details  Name: Caleb Smith MRN: [id number] Date of Birth: 12-Apr-1951 Referring Provider (PT): Marlou Sa, MD   Encounter Date: 09/16/2019  PT End of Session - 09/16/19 1213    Visit Number  7    Number of Visits  8    Date for PT Re-Evaluation  09/23/19    Authorization Type  BCBS    PT Start Time  1884    PT Stop Time  1223    PT Time Calculation (min)  38 min    Activity Tolerance  Patient tolerated treatment well    Behavior During Therapy  [facility] for tasks assessed/performed       Past Medical History:  Diagnosis Date  . Arthritis   . Coronary artery calcification seen on CAT scan 08/21/2017  . GERD (gastroesophageal reflux disease) 1995   s/p esoph dilation for stricture  . HLD (hyperlipidemia)   . Hypothyroidism   . Obesity 07/14/2014    Past Surgical History:  Procedure Laterality Date  . CARDIOVASCULAR STRESS TEST  07/2017   low risk study ([facility])  . COLONOSCOPY  03/2003   small sigmoid polyp, rpt 5 yrs [name])  . ESOPHAGOGASTRODUODENOSCOPY  1995   s/p esophageal dilation  . FACIAL RECONSTRUCTION SURGERY  1990s   cow headbutted him  . FINGER SURGERY Left teenager   reattachment of 2nd,3rd,4th  . TONSILLECTOMY    . 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 TOTAL KNEE ARTHROPLASTY Right 08/09/2019   Procedure: RIGHT TOTAL KNEE ARTHROPLASTY-CEMENTED;  Surgeon: Meredith Pel, MD;  Location: [place];  Service: Orthopedics;   Laterality: Right;    There were no vitals filed for this visit.  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                       [facility] Adult PT Treatment/Exercise - 09/16/19 0001      Knee/Hip Exercises: Stretches   Active Hamstring Stretch  Right;3 reps;30 seconds    Active Hamstring Stretch Limitations  standing with foot on step    Gastroc Stretch Limitations  slantboard 30 sec X 3      Knee/Hip Exercises: Aerobic   Recumbent Bike  10 min L4       Knee/Hip Exercises: Machines for Strengthening   Cybex Leg Press  SL 82 lbs 3X15 reps      Knee/Hip Exercises: Standing   Other Standing Knee Exercises  lunges with one UE support X 15 bilat, up and down 4 flights of stairs no UE support recipriocal pattern    Other Standing Knee Exercises  lateral resisted walking with green band, H.S curls X 20, hip ext X 20 all with green band  PT Long Term Goals - 09/16/19 1230      PT LONG TERM GOAL #1   Title  Pt will be I and compliant with HEP. (target for all goals 4 weeks 09/23/19)    Status  Achieved      PT LONG TERM GOAL #2   Title  Pt will improve Rt knee ROM 0-120 deg to improve function.    Status  Achieved      PT LONG TERM GOAL #3   Title  Pt will ambulate community distances without significant gait deviations or pain.    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    Status  Achieved            Plan - 09/16/19 1223    Clinical Impression Statement  He has met all PT goals and has no complaints. He has good ROM and strength without pain. He will be discharged today.    PT Treatment/Interventions  ADLs/Self Care Home Management;Cryotherapy;Electrical Stimulation;Moist  Heat;Ultrasound;Gait training;Stair training;Therapeutic activities;Therapeutic exercise;Balance training;Neuromuscular re-education;Manual techniques;Passive range of motion;Dry needling;Joint Manipulations;Taping;Vasopneumatic Device    PT Next Visit Plan  progress functional strength and standing tolerance as able, needs knee extenson ROM    PT Home Exercise Plan  Access Code: [email or web address]    Consulted and Agree with Plan of Care  Patient       Patient will benefit from skilled therapeutic intervention in order to improve the following deficits and impairments:  Abnormal gait, Decreased endurance, Decreased activity tolerance, Difficulty walking, Impaired flexibility, Decreased range of motion, Pain  Visit Diagnosis: Acute pain of right knee  Stiffness of right knee, not elsewhere classified  Localized edema  Other abnormalities of gait and mobility     Problem List Patient Active Problem List   Diagnosis Date Noted  . Arthritis of right knee 08/09/2019  . Open wound of right chest wall 07/18/2019  . Advanced care planning/counseling discussion 11/13/2017  . Coronary artery calcification seen on CAT scan 08/21/2017  . Atherosclerosis of aorta ([facility]) 07/03/2017  . Lung nodule, solitary 06/28/2017  . Pedal edema 11/05/2016  . Upper airway cough syndrome 06/26/2016  . Plantar fasciitis, right 10/30/2015  . Health maintenance examination 10/27/2014  . Lesion of nose 07/14/2014  . Obesity, Class I, BMI 30-34.9 07/14/2014  . Hypothyroidism   . HLD (hyperlipidemia)   . GERD (gastroesophageal reflux disease)     Brian R Nelson, PT,DPT 09/16/2019, 12:30 PM  [facility] Therapy [address] [place], [place], [address] Phone: [phone]   Fax:  [phone]  Name: Caleb Smith MRN: [id number] Date of Birth: 05/17/1951   

## 2019-09-20 ENCOUNTER — Encounter: Payer: BC Managed Care – PPO | Admitting: Physical Therapy

## 2019-09-22 ENCOUNTER — Encounter: Payer: BC Managed Care – PPO | Admitting: Physical Therapy

## 2019-09-28 ENCOUNTER — Other Ambulatory Visit: Payer: Self-pay | Admitting: Surgical

## 2019-09-28 NOTE — Telephone Encounter (Signed)
Pls advise. Thanks.  

## 2019-10-24 ENCOUNTER — Other Ambulatory Visit: Payer: Self-pay | Admitting: Surgical

## 2019-10-24 NOTE — Telephone Encounter (Signed)
Pls advise.  

## 2019-10-27 ENCOUNTER — Ambulatory Visit: Payer: BC Managed Care – PPO | Admitting: Orthopedic Surgery

## 2019-10-27 ENCOUNTER — Encounter: Payer: Self-pay | Admitting: Orthopedic Surgery

## 2019-10-27 ENCOUNTER — Other Ambulatory Visit: Payer: Self-pay

## 2019-10-27 DIAGNOSIS — Z96651 Presence of right artificial knee joint: Secondary | ICD-10-CM

## 2019-10-27 NOTE — Progress Notes (Signed)
Post-Op Visit Note   Patient: Caleb Smith           Date of Birth: 1950/11/11           MRN: [id number] Visit Date: 10/27/2019 PCP: Ria Bush, MD   Assessment & Plan:  Chief Complaint: No chief complaint on file.  Visit Diagnoses: No diagnosis found.  Plan: Caleb Smith is now about 2-1/2 months out right total knee replacement.  Is been doing well.  He is back at work.  On exam is got full extension to about 105 of flexion pretty easily.  Mild effusion present.  Incision intact with no redness or erythema.  Plan at this time is for oral antibiotics before any kind of tooth cleaning.  Continue with leg strengthening and range of motion exercises.  Overall Caleb Smith is doing very well.  I will see him back as needed.  I think he should continue to improve throughout the summer in terms of range of motion and strength.  Follow-Up Instructions: No follow-ups on file.   Orders:  No orders of the defined types were placed in this encounter.  No orders of the defined types were placed in this encounter.   Imaging: No results found.  PMFS History: Patient Active Problem List   Diagnosis Date Noted  . Arthritis of right knee 08/09/2019  . Open wound of right chest wall 07/18/2019  . Advanced care planning/counseling discussion 11/13/2017  . Coronary artery calcification seen on CAT scan 08/21/2017  . Atherosclerosis of aorta ([place]) 07/03/2017  . Lung nodule, solitary 06/28/2017  . Pedal edema 11/05/2016  . Upper airway cough syndrome 06/26/2016  . Plantar fasciitis, right 10/30/2015  . Health maintenance examination 10/27/2014  . Lesion of nose 07/14/2014  . Obesity, Class I, BMI 30-34.9 07/14/2014  . Hypothyroidism   . HLD (hyperlipidemia)   . GERD (gastroesophageal reflux disease)    Past Medical History:  Diagnosis Date  . Arthritis   . Coronary artery calcification seen on CAT scan 08/21/2017  . GERD (gastroesophageal reflux disease) 1995   s/p esoph dilation  for stricture  . HLD (hyperlipidemia)   . Hypothyroidism   . Obesity 07/14/2014    Family History  Problem Relation Age of Onset  . Cancer Mother        breast  . CAD Maternal Uncle 41       massive MI  . Suicidality Father        suicide  . Stroke Neg Hx   . Diabetes Neg Hx   . Hypertension Neg Hx     Past Surgical History:  Procedure Laterality Date  . CARDIOVASCULAR STRESS TEST  07/2017   low risk study ([facility])  . COLONOSCOPY  03/2003   small sigmoid polyp, rpt 5 yrs [name])  . ESOPHAGOGASTRODUODENOSCOPY  1995   s/p esophageal dilation  . FACIAL RECONSTRUCTION SURGERY  1990s   cow headbutted him  . FINGER SURGERY Left teenager   reattachment of 2nd,3rd,4th  . TONSILLECTOMY    . 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 TOTAL KNEE ARTHROPLASTY Right 08/09/2019   Procedure: RIGHT TOTAL KNEE ARTHROPLASTY-CEMENTED;  Surgeon: Meredith Pel, MD;  Location: [facility];  Service: Orthopedics;  Laterality: Right;   Social History   Occupational History  . Not on file  Tobacco Use  . Smoking status: Never Smoker  . Smokeless tobacco: Former Systems developer    Types: Chew  Substance and Sexual Activity  . Alcohol use: Yes    Alcohol/week: 0.0  standard drinks    Comment: 2-3 beer daily  . Drug use: No  . Sexual activity: Not on file

## 2019-11-14 ENCOUNTER — Other Ambulatory Visit: Payer: Self-pay | Admitting: Family Medicine

## 2019-11-14 DIAGNOSIS — E7800 Pure hypercholesterolemia, unspecified: Secondary | ICD-10-CM

## 2019-11-14 DIAGNOSIS — Z125 Encounter for screening for malignant neoplasm of prostate: Secondary | ICD-10-CM

## 2019-11-14 DIAGNOSIS — E039 Hypothyroidism, unspecified: Secondary | ICD-10-CM

## 2019-11-15 ENCOUNTER — Other Ambulatory Visit: Payer: Self-pay | Admitting: Family Medicine

## 2019-11-17 ENCOUNTER — Other Ambulatory Visit: Payer: Self-pay | Admitting: Surgical

## 2019-11-17 NOTE — Telephone Encounter (Signed)
Please advise. Thanks.  

## 2019-11-17 NOTE — Telephone Encounter (Signed)
GD pt

## 2019-11-18 ENCOUNTER — Other Ambulatory Visit (INDEPENDENT_AMBULATORY_CARE_PROVIDER_SITE_OTHER): Payer: BC Managed Care – PPO

## 2019-11-18 DIAGNOSIS — E039 Hypothyroidism, unspecified: Secondary | ICD-10-CM | POA: Diagnosis not present

## 2019-11-18 DIAGNOSIS — E7800 Pure hypercholesterolemia, unspecified: Secondary | ICD-10-CM

## 2019-11-18 DIAGNOSIS — Z125 Encounter for screening for malignant neoplasm of prostate: Secondary | ICD-10-CM | POA: Diagnosis not present

## 2019-11-18 LAB — LIPID PANEL
Cholesterol: 130 mg/dL (ref 0–200)
HDL: 54.6 mg/dL (ref >39.00)
LDL Cholesterol: 60 mg/dL (ref 0–99)
NonHDL: 75.88
Total CHOL/HDL Ratio: 2
Triglycerides: 78 mg/dL (ref 0.0–149.0)
VLDL: 15.6 mg/dL (ref 0.0–40.0)

## 2019-11-18 LAB — COMPREHENSIVE METABOLIC PANEL
ALT: 31 U/L (ref 0–53)
AST: 19 U/L (ref 0–37)
Albumin: 4.3 g/dL (ref 3.5–5.2)
Alkaline Phosphatase: 62 U/L (ref 39–117)
BUN: 19 mg/dL (ref 6–23)
CO2: 27 mEq/L (ref 19–32)
Calcium: 9.3 mg/dL (ref 8.4–10.5)
Chloride: 104 mEq/L (ref 96–112)
Creatinine, Ser: 1.19 mg/dL (ref 0.40–1.50)
GFR: 60.79 mL/min (ref >60.00)
Glucose, Bld: 101 mg/dL — ABNORMAL HIGH (ref 70–99)
Potassium: 4.1 mEq/L (ref 3.5–5.1)
Sodium: 137 mEq/L (ref 135–145)
Total Bilirubin: 0.6 mg/dL (ref 0.2–1.2)
Total Protein: 6.5 g/dL (ref 6.0–8.3)

## 2019-11-18 LAB — TSH: TSH: 3.56 u[IU]/mL (ref 0.35–4.50)

## 2019-11-18 LAB — PSA: PSA: 0.93 ng/mL (ref 0.10–4.00)

## 2019-11-22 ENCOUNTER — Encounter: Payer: BLUE CROSS/BLUE SHIELD | Admitting: Family Medicine

## 2019-11-25 ENCOUNTER — Encounter: Payer: Self-pay | Admitting: Family Medicine

## 2019-11-25 ENCOUNTER — Ambulatory Visit (INDEPENDENT_AMBULATORY_CARE_PROVIDER_SITE_OTHER): Payer: BC Managed Care – PPO | Admitting: Family Medicine

## 2019-11-25 ENCOUNTER — Other Ambulatory Visit: Payer: Self-pay

## 2019-11-25 VITALS — BP 130/78 | HR 58 | Temp 97.9°F | Ht 67.0 in | Wt 203.1 lb

## 2019-11-25 DIAGNOSIS — M1711 Unilateral primary osteoarthritis, right knee: Secondary | ICD-10-CM

## 2019-11-25 DIAGNOSIS — Z789 Other specified health status: Secondary | ICD-10-CM | POA: Insufficient documentation

## 2019-11-25 DIAGNOSIS — I2510 Atherosclerotic heart disease of native coronary artery without angina pectoris: Secondary | ICD-10-CM

## 2019-11-25 DIAGNOSIS — Z7189 Other specified counseling: Secondary | ICD-10-CM | POA: Diagnosis not present

## 2019-11-25 DIAGNOSIS — F1090 Alcohol use, unspecified, uncomplicated: Secondary | ICD-10-CM | POA: Insufficient documentation

## 2019-11-25 DIAGNOSIS — Z7289 Other problems related to lifestyle: Secondary | ICD-10-CM

## 2019-11-25 DIAGNOSIS — I700 Atherosclerosis of aorta: Secondary | ICD-10-CM

## 2019-11-25 DIAGNOSIS — Z0000 Encounter for general adult medical examination without abnormal findings: Secondary | ICD-10-CM | POA: Diagnosis not present

## 2019-11-25 DIAGNOSIS — Z23 Encounter for immunization: Secondary | ICD-10-CM | POA: Diagnosis not present

## 2019-11-25 DIAGNOSIS — Z1211 Encounter for screening for malignant neoplasm of colon: Secondary | ICD-10-CM | POA: Diagnosis not present

## 2019-11-25 DIAGNOSIS — K219 Gastro-esophageal reflux disease without esophagitis: Secondary | ICD-10-CM

## 2019-11-25 DIAGNOSIS — E039 Hypothyroidism, unspecified: Secondary | ICD-10-CM | POA: Diagnosis not present

## 2019-11-25 DIAGNOSIS — E7800 Pure hypercholesterolemia, unspecified: Secondary | ICD-10-CM

## 2019-11-25 NOTE — Assessment & Plan Note (Signed)
Preventative protocols reviewed and updated unless pt declined. Discussed healthy diet and lifestyle.  

## 2019-11-25 NOTE — Assessment & Plan Note (Signed)
Chronic, stable on PPI.

## 2019-11-25 NOTE — Assessment & Plan Note (Addendum)
Chronic, stable. Continue current regimen.  He takes with PPI because this is how he remembers to take.

## 2019-11-25 NOTE — Assessment & Plan Note (Signed)
Advanced directive - does not have set up. Would want wife then daughters to be HCPOA. Packet provided today.

## 2019-11-25 NOTE — Progress Notes (Signed)
This visit was conducted in person.  BP 130/78 (BP Location: Left Arm, Patient Position: Sitting, Cuff Size: Normal)   Pulse (!) 58   Temp 97.9 F (36.6 C) (Temporal)   Ht 5' 7"  (1.702 m)   Wt 203 lb 1 oz (92.1 kg)   SpO2 96%   BMI 31.80 kg/m    CC: CPE Subjective:    Patient ID: Caleb Smith, male    DOB: 12/13/1950, 69 y.o.   MRN: [id number]  HPI: Caleb Smith is a 69 y.o. male presenting on 11/25/2019 for Annual Exam   Did not see health advisor this year as he just has medicare part A.   No exam data present    Office Visit from 11/25/2019 in [place] at [facility] Total Score  0      No flowsheet data found.    Preventative: COLONOSCOPY Date: 03/2003 small sigmoid polyp, rpt 5 yrs [name]). Did not do stool kit last few years. Agrees to stool kit this year.  Prostate cancer screening - discussed screening, deferred DRE. Declines significant BPH symptoms. No fmhx prostate cancer.  Lung cancer screening - not eligible  Flu shot yearly Pneumovax 2013, prevnar 06/2017. Last pneumovax today  Td 2011  COVID vaccine - completed J&J 09/2019 Zostavax 2013 Shingrix - 01/2018, 06/2018 Advanced directive - does not have set up. Would want wife then daughters to be HCPOA. Packet provided today. Seat belt use discussed. Sunscreen use discussed- doesn't use. No changing moles on skin. Non smoker.  Alcohol -4beers/day, more on weekends  Dentist - yearly Eye exam yearly Bowel - no constipation  Bladder - no incontinence   Lives with wife Grown children Occupation: Gen foreman/lineman  Edu: HS Activity: active on farm  Diet: some water, fruits/vegetables daily     Relevant past medical, surgical, family and social history reviewed and updated as indicated. Interim medical history since our last visit reviewed. Allergies and medications reviewed and updated. Outpatient Medications Prior to Visit  Medication Sig Dispense Refill  .  aspirin (ASPIRIN LOW DOSE) 81 MG chewable tablet Chew 1 tablet (81 mg total) by mouth daily. 60 tablet 0  . atorvastatin (LIPITOR) 80 MG tablet TAKE 1 TABLET BY MOUTH EVERY DAY (Patient taking differently: Take 80 mg by mouth daily. ) 90 tablet 2  . cholecalciferol (VITAMIN D) 1000 units tablet Take 1,000 Units by mouth daily.    Marland Kitchen ezetimibe (ZETIA) 10 MG tablet TAKE 1 TABLET BY MOUTH EVERY DAY 90 tablet 0  . Omega-3 Fatty Acids (FISH OIL) 1200 MG CAPS Take 1,200 mg by mouth daily.    Marland Kitchen omeprazole (PRILOSEC) 40 MG capsule TAKE 1 CAPSULE BY MOUTH EVERY DAY 90 capsule 0  . SYNTHROID 100 MCG tablet Take 1 tablet (100 mcg total) by mouth daily before breakfast. 90 tablet 3  . vitamin B-12 (CYANOCOBALAMIN) 1000 MCG tablet Take 1,000 mcg by mouth daily.    . celecoxib (CELEBREX) 200 MG capsule Take 1 capsule (200 mg total) by mouth 2 (two) times daily. Additionally, take 451m the morning of your surgery as soon as you wake up with a small sip of water 60 capsule 0  . methocarbamol (ROBAXIN) 500 MG tablet Take 1 tablet (500 mg total) by mouth every 8 (eight) hours as needed for muscle spasms. 30 tablet 0  . oxyCODONE (OXY IR/ROXICODONE) 5 MG immediate release tablet Take 1 tablet (5 mg total) by mouth every 4 (four) hours as needed for moderate pain (  pain score 4-6). 35 tablet 0   No facility-administered medications prior to visit.     Per HPI unless specifically indicated in ROS section below Review of Systems  Constitutional: Negative for activity change, appetite change, chills, fatigue, fever and unexpected weight change.  HENT: Negative for hearing loss.   Eyes: Negative for visual disturbance.  Respiratory: Positive for cough. Negative for chest tightness, shortness of breath and wheezing.   Cardiovascular: Negative for chest pain, palpitations and leg swelling.  Gastrointestinal: Negative for abdominal distention, abdominal pain, blood in stool, constipation, diarrhea, nausea and vomiting.    Genitourinary: Negative for difficulty urinating and hematuria.  Musculoskeletal: Negative for arthralgias, myalgias and neck pain.  Skin: Negative for rash.  Neurological: Negative for dizziness, seizures, syncope and headaches.  Hematological: Negative for adenopathy. Bruises/bleeds easily.  Psychiatric/Behavioral: Negative for dysphoric mood. The patient is not nervous/anxious.    Objective:  BP 130/78 (BP Location: Left Arm, Patient Position: Sitting, Cuff Size: Normal)   Pulse (!) 58   Temp 97.9 F (36.6 C) (Temporal)   Ht 5' 7"  (1.702 m)   Wt 203 lb 1 oz (92.1 kg)   SpO2 96%   BMI 31.80 kg/m   Wt Readings from Last 3 Encounters:  11/25/19 203 lb 1 oz (92.1 kg)  08/09/19 201 lb (91.2 kg)  08/05/19 203 lb 1.6 oz (92.1 kg)      Physical Exam Vitals and nursing note reviewed.  Constitutional:      General: He is not in acute distress.    Appearance: Normal appearance. He is well-developed.  HENT:     Head: Normocephalic and atraumatic.     Right Ear: Hearing, tympanic membrane, ear canal and external ear normal.     Left Ear: Hearing, tympanic membrane, ear canal and external ear normal.  Eyes:     General: No scleral icterus.    Extraocular Movements: Extraocular movements intact.     Conjunctiva/sclera: Conjunctivae normal.     Pupils: Pupils are equal, round, and reactive to light.  Neck:     Vascular: No carotid bruit.  Cardiovascular:     Rate and Rhythm: Normal rate and regular rhythm.     Pulses: Normal pulses.          Radial pulses are 2+ on the right side and 2+ on the left side.     Heart sounds: Normal heart sounds. No murmur.  Pulmonary:     Effort: Pulmonary effort is normal. No respiratory distress.     Breath sounds: Normal breath sounds. No wheezing, rhonchi or rales.  Abdominal:     General: Abdomen is flat. Bowel sounds are normal. There is no distension.     Palpations: Abdomen is soft. There is no mass.     Tenderness: There is no abdominal  tenderness. There is no guarding or rebound.     Hernia: No hernia is present.  Musculoskeletal:        General: Normal range of motion.     Cervical back: Normal range of motion and neck supple.     Right lower leg: No edema.     Left lower leg: No edema.  Lymphadenopathy:     Cervical: No cervical adenopathy.  Skin:    General: Skin is warm and dry.     Findings: No rash.  Neurological:     General: No focal deficit present.     Mental Status: He is alert and oriented to person, place, and time.  Comments: CN grossly intact, station and gait intact  Psychiatric:        Mood and Affect: Mood normal.        Behavior: Behavior normal.        Thought Content: Thought content normal.        Judgment: Judgment normal.       Results for orders placed or performed in visit on 11/18/19  PSA  Result Value Ref Range   PSA 0.93 0.10 - 4.00 ng/mL  TSH  Result Value Ref Range   TSH 3.56 0.35 - 4.50 uIU/mL  Comprehensive metabolic panel  Result Value Ref Range   Sodium 137 135 - 145 mEq/L   Potassium 4.1 3.5 - 5.1 mEq/L   Chloride 104 96 - 112 mEq/L   CO2 27 19 - 32 mEq/L   Glucose, Bld 101 (H) 70 - 99 mg/dL   BUN 19 6 - 23 mg/dL   Creatinine, Ser 1.19 0.40 - 1.50 mg/dL   Total Bilirubin 0.6 0.2 - 1.2 mg/dL   Alkaline Phosphatase 62 39 - 117 U/L   AST 19 0 - 37 U/L   ALT 31 0 - 53 U/L   Total Protein 6.5 6.0 - 8.3 g/dL   Albumin 4.3 3.5 - 5.2 g/dL   GFR 60.79 >60.00 mL/min   Calcium 9.3 8.4 - 10.5 mg/dL  Lipid panel  Result Value Ref Range   Cholesterol 130 0 - 200 mg/dL   Triglycerides 78.0 0.0 - 149.0 mg/dL   HDL 54.60 >39.00 mg/dL   VLDL 15.6 0.0 - 40.0 mg/dL   LDL Cholesterol 60 0 - 99 mg/dL   Total CHOL/HDL Ratio 2    NonHDL 75.88    Assessment & Plan:  This visit occurred during the SARS-CoV-2 public health emergency.  Safety protocols were in place, including screening questions prior to the visit, additional usage of staff PPE, and extensive cleaning of exam  room while observing appropriate contact time as indicated for disinfecting solutions.   Problem List Items Addressed This Visit    Hypothyroidism    Chronic, stable. Continue current regimen.  He takes with PPI because this is how he remembers to take.       HLD (hyperlipidemia)    Chronic, stable. Continue current regimen The 10-year ASCVD risk score Mikey Bussing DC Jr., et al., 2013) is: 12.1%   Values used to calculate the score:     Age: 59 years     Sex: Male     Is Non-Hispanic African American: No     Diabetic: No     Tobacco smoker: No     Systolic Blood Pressure: 128 mmHg     Is BP treated: No     HDL Cholesterol: 54.6 mg/dL     Total Cholesterol: 130 mg/dL       Health maintenance examination - Primary    Preventative protocols reviewed and updated unless pt declined. Discussed healthy diet and lifestyle.       GERD (gastroesophageal reflux disease)    Chronic, stable on PPI.       Coronary artery calcification seen on CAT scan    Continue aspirin, statin.       Atherosclerosis of aorta ([facility])    Continues statin and aspirin      Arthritis of right knee   Alcohol use    Encouraged limiting intake.       Advanced care planning/counseling discussion    Advanced directive - does not have set up.  Would want wife then daughters to be HCPOA. Packet provided today.       Other Visit Diagnoses    Special screening for malignant neoplasms, colon       Relevant Orders   Fecal occult blood, imunochemical   Need for 23-polyvalent pneumococcal polysaccharide vaccine       Relevant Orders   Pneumococcal polysaccharide vaccine 23-valent greater than or equal to 2yo subcutaneous/IM (Completed)       No orders of the defined types were placed in this encounter.  Orders Placed This Encounter  Procedures  . Fecal occult blood, imunochemical    Standing Status:   Future    Standing Expiration Date:   11/24/2020  . Pneumococcal polysaccharide vaccine 23-valent greater  than or equal to 2yo subcutaneous/IM    Patient instructions: Final pneumonia shot today (pneumovax)  Pass by lab to pick up stool kit.  You are doing well today Return as needed or in 1 year for next physical.  Work on advanced directive. New packet provided today.   Follow up plan: Return in about 1 year (around 11/24/2020) for annual exam, prior fasting for blood work.  Ria Bush, MD

## 2019-11-25 NOTE — Assessment & Plan Note (Signed)
Continue aspirin, statin.  

## 2019-11-25 NOTE — Assessment & Plan Note (Addendum)
Encouraged limiting intake.

## 2019-11-25 NOTE — Assessment & Plan Note (Signed)
Chronic, stable. Continue current regimen The 10-year ASCVD risk score Caleb Smith., et al., 2013) is: 12.1%   Values used to calculate the score:     Age: 69 years     Sex: Male     Is Non-Hispanic African American: No     Diabetic: No     Tobacco smoker: No     Systolic Blood Pressure: [id number]     Is BP treated: No     HDL Cholesterol: 54.6 mg/dL     Total Cholesterol: 130 mg/dL

## 2019-11-25 NOTE — Assessment & Plan Note (Signed)
Continues statin and aspirin

## 2019-11-25 NOTE — Patient Instructions (Addendum)
Final pneumonia shot today (pneumovax)  Pass by lab to pick up stool kit.  You are doing well today Return as needed or in 1 year for next physical.  Work on advanced directive. New packet provided today.   Health Maintenance After Age 68 After age 33, you are at a higher risk for certain long-term diseases and infections as well as injuries from falls. Falls are a major cause of broken bones and head injuries in people who are older than age 99. Getting regular preventive care can help to keep you healthy and well. Preventive care includes getting regular testing and making lifestyle changes as recommended by your health care provider. Talk with your health care provider about:  Which screenings and tests you should have. A screening is a test that checks for a disease when you have no symptoms.  A diet and exercise plan that is right for you. What should I know about screenings and tests to prevent falls? Screening and testing are the best ways to find a health problem early. Early diagnosis and treatment give you the best chance of managing medical conditions that are common after age 96. Certain conditions and lifestyle choices may make you more likely to have a fall. Your health care provider may recommend:  Regular vision checks. Poor vision and conditions such as cataracts can make you more likely to have a fall. If you wear glasses, make sure to get your prescription updated if your vision changes.  Medicine review. Work with your health care provider to regularly review all of the medicines you are taking, including over-the-counter medicines. Ask your health care provider about any side effects that may make you more likely to have a fall. Tell your health care provider if any medicines that you take make you feel dizzy or sleepy.  Osteoporosis screening. Osteoporosis is a condition that causes the bones to get weaker. This can make the bones weak and cause them to break more  easily.  Blood pressure screening. Blood pressure changes and medicines to control blood pressure can make you feel dizzy.  Strength and balance checks. Your health care provider may recommend certain tests to check your strength and balance while standing, walking, or changing positions.  Foot health exam. Foot pain and numbness, as well as not wearing proper footwear, can make you more likely to have a fall.  Depression screening. You may be more likely to have a fall if you have a fear of falling, feel emotionally low, or feel unable to do activities that you used to do.  Alcohol use screening. Using too much alcohol can affect your balance and may make you more likely to have a fall. What actions can I take to lower my risk of falls? General instructions  Talk with your health care provider about your risks for falling. Tell your health care provider if: ? You fall. Be sure to tell your health care provider about all falls, even ones that seem minor. ? You feel dizzy, sleepy, or off-balance.  Take over-the-counter and prescription medicines only as told by your health care provider. These include any supplements.  Eat a healthy diet and maintain a healthy weight. A healthy diet includes low-fat dairy products, low-fat (lean) meats, and fiber from whole grains, beans, and lots of fruits and vegetables. Home safety  Remove any tripping hazards, such as rugs, cords, and clutter.  Install safety equipment such as grab bars in bathrooms and safety rails on stairs.  Keep rooms  and walkways well-lit. Activity   Follow a regular exercise program to stay fit. This will help you maintain your balance. Ask your health care provider what types of exercise are appropriate for you.  If you need a cane or walker, use it as recommended by your health care provider.  Wear supportive shoes that have nonskid soles. Lifestyle  Do not drink alcohol if your health care provider tells you not to  drink.  If you drink alcohol, limit how much you have: ? 0-1 drink a day for women. ? 0-2 drinks a day for men.  Be aware of how much alcohol is in your drink. In the [place] one drink equals one typical bottle of beer (12 oz), one-half glass of wine (5 oz), or one shot of hard liquor (1 oz).  Do not use any products that contain nicotine or tobacco, such as cigarettes and e-cigarettes. If you need help quitting, ask your health care provider. Summary  Having a healthy lifestyle and getting preventive care can help to protect your health and wellness after age 89.  Screening and testing are the best way to find a health problem early and help you avoid having a fall. Early diagnosis and treatment give you the best chance for managing medical conditions that are more common for people who are older than age 53.  Falls are a major cause of broken bones and head injuries in people who are older than age 44. Take precautions to prevent a fall at home.  Work with your health care provider to learn what changes you can make to improve your health and wellness and to prevent falls. This information is not intended to replace advice given to you by your health care provider. Make sure you discuss any questions you have with your health care provider. Document Revised: 10/07/2018 Document Reviewed: 04/29/2017 Elsevier Patient Education  2020 [employer].

## 2019-11-26 ENCOUNTER — Other Ambulatory Visit: Payer: Self-pay | Admitting: Family Medicine

## 2019-12-20 ENCOUNTER — Other Ambulatory Visit: Payer: Self-pay | Admitting: Surgical

## 2019-12-20 NOTE — Telephone Encounter (Signed)
Pls advise.  

## 2020-01-03 ENCOUNTER — Encounter: Payer: Self-pay | Admitting: Radiology

## 2020-01-03 ENCOUNTER — Telehealth: Payer: Self-pay | Admitting: Radiology

## 2020-01-03 ENCOUNTER — Other Ambulatory Visit (INDEPENDENT_AMBULATORY_CARE_PROVIDER_SITE_OTHER): Payer: BC Managed Care – PPO

## 2020-01-03 DIAGNOSIS — Z1211 Encounter for screening for malignant neoplasm of colon: Secondary | ICD-10-CM

## 2020-01-03 DIAGNOSIS — R195 Other fecal abnormalities: Secondary | ICD-10-CM

## 2020-01-03 LAB — FECAL OCCULT BLOOD, IMMUNOCHEMICAL: Fecal Occult Bld: POSITIVE — AB

## 2020-01-03 NOTE — Telephone Encounter (Signed)
Lvm asking pt to call back.  Need to relay Dr. G's message.  

## 2020-01-03 NOTE — Telephone Encounter (Signed)
Please notify stool test returned positive for blood.  For this reason I recommend referral to GI to discuss colonoscopy. Referral placed to [facility] - ensure he's ok going to [place].

## 2020-01-03 NOTE — Telephone Encounter (Signed)
[facility] called a positive IFOB, results given to Dr Gutierrez 

## 2020-01-05 NOTE — Telephone Encounter (Signed)
Patient called back Stated he missed a call today from the office

## 2020-01-05 NOTE — Telephone Encounter (Signed)
Spoke relaying Dr. Synthia Innocent message.  Pt verbalizes understanding and states he has spoken with Rosaria Ferries.

## 2020-01-18 ENCOUNTER — Other Ambulatory Visit: Payer: Self-pay | Admitting: Family Medicine

## 2020-01-24 ENCOUNTER — Other Ambulatory Visit: Payer: Self-pay

## 2020-01-24 ENCOUNTER — Telehealth (INDEPENDENT_AMBULATORY_CARE_PROVIDER_SITE_OTHER): Payer: Self-pay | Admitting: Gastroenterology

## 2020-01-24 DIAGNOSIS — R195 Other fecal abnormalities: Secondary | ICD-10-CM

## 2020-01-24 MED ORDER — NA SULFATE-K SULFATE-MG SULF 17.5-3.13-1.6 GM/177ML PO SOLN: 1.0000 | Freq: Once | ORAL | 0 refills | Status: AC

## 2020-01-24 NOTE — Progress Notes (Signed)
Gastroenterology Pre-Procedure Review  Request Date: Tuesday 02/14/20 Requesting Physician: Dr. Bonna Gains  PATIENT REVIEW QUESTIONS: The patient responded to the following health history questions as indicated:    1. Are you having any GI issues? Patient states no GI Issues.  Referral is for occult blood in stool. 2. Do you have a personal history of Polyps? yes (2004 [place] Colonoscopy report noted 0.3cm polyp.  No other colonoscopy since this time noted.) 3. Do you have a family history of Colon Cancer or Polyps? no 4. Diabetes Mellitus? no 5. Joint replacements in the past 12 months?yes (Knee Replacement Surgery Feb 9th 2021) 6. Major health problems in the past 3 months?no 7. Any artificial heart valves, MVP, or defibrillator?no    MEDICATIONS & ALLERGIES:    Patient reports the following regarding taking any anticoagulation/antiplatelet therapy:   Plavix, Coumadin, Eliquis, Xarelto, Lovenox, Pradaxa, Brilinta, or Effient? no Aspirin? yes (81 mg daily)  Patient confirms/reports the following medications:  Current Outpatient Medications  Medication Sig Dispense Refill  . ASPIRIN LOW DOSE 81 MG chewable tablet CHEW 1 TABLET (81 MG TOTAL) BY MOUTH DAILY. 30 tablet 1  . atorvastatin (LIPITOR) 80 MG tablet TAKE 1 TABLET BY MOUTH EVERY DAY 90 tablet 3  . cholecalciferol (VITAMIN D) 1000 units tablet Take 1,000 Units by mouth daily.    Marland Kitchen ezetimibe (ZETIA) 10 MG tablet TAKE 1 TABLET BY MOUTH EVERY DAY 90 tablet 0  . Omega-3 Fatty Acids (FISH OIL) 1200 MG CAPS Take 1,200 mg by mouth daily.    Marland Kitchen omeprazole (PRILOSEC) 40 MG capsule TAKE 1 CAPSULE BY MOUTH EVERY DAY 90 capsule 0  . SYNTHROID 100 MCG tablet TAKE 1 TABLET (100 MCG TOTAL) BY MOUTH DAILY BEFORE BREAKFAST. 90 tablet 3  . vitamin B-12 (CYANOCOBALAMIN) 1000 MCG tablet Take 1,000 mcg by mouth daily.     No current facility-administered medications for this visit.    Patient confirms/reports the following allergies:  No Known  Allergies  No orders of the defined types were placed in this encounter.   AUTHORIZATION INFORMATION Primary Insurance: 1D#: Group #:  Secondary Insurance: 1D#: Group #:  SCHEDULE INFORMATION: Date: Tuesday 02/14/20 Time: Location:[facility]

## 2020-02-03 ENCOUNTER — Other Ambulatory Visit: Payer: Self-pay

## 2020-02-03 MED ORDER — AMOXICILLIN 500 MG PO TABS
ORAL_TABLET | ORAL | 0 refills | Status: DC
Start: 2020-02-03 — End: 2020-11-30

## 2020-02-03 NOTE — Progress Notes (Signed)
rx for antibiotic submitted to patients pharmacy per Dr Marlou Sa for him to take prior to colonoscopy procedure.

## 2020-02-10 ENCOUNTER — Other Ambulatory Visit
Admission: RE | Admit: 2020-02-10 | Discharge: 2020-02-10 | Disposition: A | Discharge status: Home / Self Care | Payer: BC Managed Care – PPO | Source: Ambulatory Visit | Attending: Gastroenterology | Admitting: Gastroenterology

## 2020-02-10 ENCOUNTER — Other Ambulatory Visit: Payer: Self-pay

## 2020-02-10 DIAGNOSIS — Z01812 Encounter for preprocedural laboratory examination: Secondary | ICD-10-CM | POA: Diagnosis not present

## 2020-02-10 DIAGNOSIS — Z20822 Contact with and (suspected) exposure to covid-19: Secondary | ICD-10-CM | POA: Diagnosis not present

## 2020-02-11 LAB — SARS CORONAVIRUS 2 (TAT 6-24 HRS): SARS Coronavirus 2: NEGATIVE

## 2020-02-12 ENCOUNTER — Other Ambulatory Visit: Payer: Self-pay | Admitting: Family Medicine

## 2020-02-13 ENCOUNTER — Encounter: Payer: Self-pay | Admitting: Gastroenterology

## 2020-02-13 ENCOUNTER — Telehealth: Payer: Self-pay

## 2020-02-13 NOTE — Telephone Encounter (Signed)
Patient contacted the office to make sure he was okay to take his antibiotics prior to having his colonoscopy.  He had knee replacement earlier this year in February.  His orthopedics has prescribed 4 amoxicillin tablets to take 1 hour prior to his colonoscopy.    Patient has been advised that he can take this prescription as prescribed by his orthopedist. Procedure is tomorrow with Dr. Bonna Gains.  Thanks,  [place], [place]

## 2020-02-14 ENCOUNTER — Ambulatory Visit
Admission: RE | Admit: 2020-02-14 | Discharge: 2020-02-14 | Disposition: A | Discharge status: Home / Self Care | Payer: BC Managed Care – PPO | Attending: Gastroenterology | Admitting: Gastroenterology

## 2020-02-14 ENCOUNTER — Telehealth: Payer: Self-pay

## 2020-02-14 ENCOUNTER — Encounter
Admission: RE | Disposition: A | Discharge status: Home / Self Care | Payer: Self-pay | Source: Home / Self Care | Attending: Gastroenterology

## 2020-02-14 ENCOUNTER — Encounter: Payer: Self-pay | Admitting: Gastroenterology

## 2020-02-14 ENCOUNTER — Ambulatory Visit: Payer: BC Managed Care – PPO | Admitting: Registered Nurse

## 2020-02-14 DIAGNOSIS — K219 Gastro-esophageal reflux disease without esophagitis: Secondary | ICD-10-CM | POA: Diagnosis not present

## 2020-02-14 DIAGNOSIS — E785 Hyperlipidemia, unspecified: Secondary | ICD-10-CM | POA: Diagnosis not present

## 2020-02-14 DIAGNOSIS — E039 Hypothyroidism, unspecified: Secondary | ICD-10-CM | POA: Insufficient documentation

## 2020-02-14 DIAGNOSIS — M1990 Unspecified osteoarthritis, unspecified site: Secondary | ICD-10-CM | POA: Diagnosis not present

## 2020-02-14 DIAGNOSIS — R195 Other fecal abnormalities: Secondary | ICD-10-CM

## 2020-02-14 DIAGNOSIS — Z6830 Body mass index (BMI) 30.0-30.9, adult: Secondary | ICD-10-CM | POA: Diagnosis not present

## 2020-02-14 DIAGNOSIS — D120 Benign neoplasm of cecum: Secondary | ICD-10-CM | POA: Insufficient documentation

## 2020-02-14 DIAGNOSIS — E669 Obesity, unspecified: Secondary | ICD-10-CM | POA: Insufficient documentation

## 2020-02-14 DIAGNOSIS — D123 Benign neoplasm of transverse colon: Secondary | ICD-10-CM | POA: Insufficient documentation

## 2020-02-14 DIAGNOSIS — Z87891 Personal history of nicotine dependence: Secondary | ICD-10-CM | POA: Diagnosis not present

## 2020-02-14 DIAGNOSIS — Z7982 Long term (current) use of aspirin: Secondary | ICD-10-CM | POA: Insufficient documentation

## 2020-02-14 DIAGNOSIS — Z79899 Other long term (current) drug therapy: Secondary | ICD-10-CM | POA: Diagnosis not present

## 2020-02-14 DIAGNOSIS — Z86010 Personal history of colonic polyps: Secondary | ICD-10-CM | POA: Insufficient documentation

## 2020-02-14 DIAGNOSIS — K635 Polyp of colon: Secondary | ICD-10-CM | POA: Diagnosis not present

## 2020-02-14 DIAGNOSIS — I2510 Atherosclerotic heart disease of native coronary artery without angina pectoris: Secondary | ICD-10-CM | POA: Diagnosis not present

## 2020-02-14 DIAGNOSIS — D122 Benign neoplasm of ascending colon: Secondary | ICD-10-CM | POA: Diagnosis not present

## 2020-02-14 HISTORY — PX: COLONOSCOPY WITH PROPOFOL: SHX5780

## 2020-02-14 SURGERY — COLONOSCOPY WITH PROPOFOL
Anesthesia: General

## 2020-02-14 MED ORDER — LIDOCAINE HCL (PF) 2 % IJ SOLN
INTRAMUSCULAR | Status: AC
  Filled 2020-02-14: qty 5

## 2020-02-14 MED ORDER — LIDOCAINE HCL (CARDIAC) PF 100 MG/5ML IV SOSY
PREFILLED_SYRINGE | INTRAVENOUS | Status: DC | PRN
  Administered 2020-02-14: 30 mg via INTRAVENOUS

## 2020-02-14 MED ORDER — PROPOFOL 500 MG/50ML IV EMUL
INTRAVENOUS | Status: AC
  Filled 2020-02-14: qty 50

## 2020-02-14 MED ORDER — PROPOFOL 500 MG/50ML IV EMUL
INTRAVENOUS | Status: DC | PRN
  Administered 2020-02-14: 150 ug/kg/min via INTRAVENOUS

## 2020-02-14 MED ORDER — SODIUM CHLORIDE 0.9 % IV SOLN
INTRAVENOUS | Status: DC
  Administered 2020-02-14: 1000 mL via INTRAVENOUS

## 2020-02-14 MED ORDER — PROPOFOL 10 MG/ML IV BOLUS
INTRAVENOUS | Status: DC | PRN
  Administered 2020-02-14: 80 mg via INTRAVENOUS

## 2020-02-14 MED ORDER — PROPOFOL 10 MG/ML IV BOLUS
INTRAVENOUS | Status: AC
  Filled 2020-02-14: qty 20

## 2020-02-14 MED ORDER — MIDAZOLAM HCL 2 MG/2ML IJ SOLN
INTRAMUSCULAR | Status: DC | PRN
  Administered 2020-02-14: 2 mg via INTRAVENOUS

## 2020-02-14 MED ORDER — MIDAZOLAM HCL 2 MG/2ML IJ SOLN
INTRAMUSCULAR | Status: AC
  Filled 2020-02-14: qty 2

## 2020-02-14 MED ORDER — SPOT INK MARKER SYRINGE KIT
PACK | SUBMUCOSAL | Status: DC | PRN
  Administered 2020-02-14: 5 mL via SUBMUCOSAL

## 2020-02-14 NOTE — Anesthesia Preprocedure Evaluation (Signed)
Anesthesia Evaluation  Patient identified by MRN, date of birth, ID band Patient awake    Reviewed: Allergy & Precautions, NPO status , Patient's Chart, lab work & pertinent test results  History of Anesthesia Complications Negative for: history of anesthetic complications  Airway Mallampati: II       Dental   Pulmonary neg sleep apnea, neg COPD, Not current smoker,           Cardiovascular (-) hypertension(-) Past MI and (-) CHF (-) dysrhythmias (-) Valvular Problems/Murmurs     Neuro/Psych neg Seizures    GI/Hepatic Neg liver ROS, GERD  Medicated and Controlled,  Endo/Other  neg diabetesHypothyroidism   Renal/GU negative Renal ROS     Musculoskeletal   Abdominal   Peds  Hematology   Anesthesia Other Findings   Reproductive/Obstetrics                             Anesthesia Physical Anesthesia Plan  ASA: II  Anesthesia Plan: General   Post-op Pain Management:    Induction: Intravenous  PONV Risk Score and Plan: 2 and Propofol infusion and TIVA  Airway Management Planned: Nasal Cannula  Additional Equipment:   Intra-op Plan:   Post-operative Plan:   Informed Consent: I have reviewed the patients History and Physical, chart, labs and discussed the procedure including the risks, benefits and alternatives for the proposed anesthesia with the patient or authorized representative who has indicated his/her understanding and acceptance.       Plan Discussed with:   Anesthesia Plan Comments:         Anesthesia Quick Evaluation

## 2020-02-14 NOTE — Telephone Encounter (Signed)
A GI Procedures Order Form was faxed to [facility]. They will contact the patient.

## 2020-02-14 NOTE — Transfer of Care (Signed)
Immediate Anesthesia Transfer of Care Note  Patient: Caleb Smith  Procedure(s) Performed: Procedure(s): COLONOSCOPY WITH PROPOFOL (N/A)  Patient Location: PACU and Endoscopy Unit  Anesthesia Type:General  Level of Consciousness: sedated  Airway & Oxygen Therapy: Patient Spontanous Breathing and Patient connected to nasal cannula oxygen  Post-op Assessment: Report given to RN and Post -op Vital signs reviewed and stable  Post vital signs: Reviewed and stable  Last Vitals:  Vitals:   02/14/20 0747 02/14/20 0930  BP: 135/84 112/73  Pulse: 60 63  Resp: 16 15  Temp: (!) 35.8 C (!) 36.1 C  SpO2: 875% 64%    Complications: No apparent anesthesia complications

## 2020-02-14 NOTE — H&P (Signed)
Vonda Antigua, MD [address] [place], [place], [place], [address] [address], [place], [place], [place], [address] Phone: [phone]  Fax: [phone]  Primary Care Physician:  Ria Bush, MD   Pre-Procedure History & Physical: HPI:  Caleb Smith is a 69 y.o. male is here for a colonoscopy.   Past Medical History:  Diagnosis Date  . Arthritis   . Coronary artery calcification seen on CAT scan 08/21/2017  . GERD (gastroesophageal reflux disease) 1995   s/p esoph dilation for stricture  . HLD (hyperlipidemia)   . Hypothyroidism   . Obesity 07/14/2014    Past Surgical History:  Procedure Laterality Date  . CARDIOVASCULAR STRESS TEST  07/2017   low risk study ([facility])  . COLONOSCOPY  03/2003   small sigmoid polyp, rpt 5 yrs [name])  . ESOPHAGOGASTRODUODENOSCOPY  1995   s/p esophageal dilation  . FACIAL RECONSTRUCTION SURGERY  1990s   cow headbutted him  . FINGER SURGERY Left teenager   reattachment of 2nd,3rd,4th  . TONSILLECTOMY    . 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;  Service: Orthopedics;  Laterality: Right;    Prior to Admission medications   Medication Sig Start Date End Date Taking? Authorizing Provider  amoxicillin (AMOXIL) 500 MG tablet Take 2g 1 hour prior to colonoscopy procedure 02/03/20  Yes Meredith Pel, MD  SYNTHROID 100 MCG tablet TAKE 1 TABLET (100 MCG TOTAL) BY MOUTH DAILY BEFORE BREAKFAST. 11/26/19  Yes Ria Bush, MD  ASPIRIN LOW DOSE 81 MG chewable tablet CHEW 1 TABLET (81 MG TOTAL) BY MOUTH DAILY. 12/20/19   Magnant, Charles L, PA-C  atorvastatin (LIPITOR) 80 MG tablet TAKE 1 TABLET BY MOUTH EVERY DAY 01/18/20   Ria Bush, MD  cholecalciferol (VITAMIN D) 1000 units tablet Take 1,000 Units by mouth daily.    [provider]  ezetimibe (ZETIA) 10  MG tablet TAKE 1 TABLET BY MOUTH EVERY DAY 02/13/20   Ria Bush, MD  Omega-3 Fatty Acids (FISH OIL) 1200 MG CAPS Take 1,200 mg by mouth daily.    [provider]  omeprazole (PRILOSEC) 40 MG capsule TAKE 1 CAPSULE BY MOUTH EVERY DAY 02/13/20   Ria Bush, MD  vitamin B-12 (CYANOCOBALAMIN) 1000 MCG tablet Take 1,000 mcg by mouth daily.    [provider]    Allergies as of 01/24/2020  . (No Known Allergies)    Family History  Problem Relation Age of Onset  . Cancer Mother        breast  . CAD Maternal Uncle 1       massive MI  . Suicidality Father        suicide  . Stroke Neg Hx   . Diabetes Neg Hx   . Hypertension Neg Hx     Social History   Socioeconomic History  . Marital status: Married    Spouse name: Not on file  . Number of children: Not on file  . Years of education: Not on file  . Highest education level: Not on file  Occupational History  . Not on file  Tobacco Use  . Smoking status: Never Smoker  . Smokeless tobacco: Former Systems developer    Types: Secondary school teacher  . Vaping Use: Never used  Substance and Sexual Activity  . Alcohol use: Yes    Alcohol/week: 0.0 standard drinks    Comment:  2-3 beer daily  . Drug use: No  . Sexual activity: Not on file  Other Topics Concern  . Not on file  Social History Narrative   Lives with wife   Grown children (daughter is Lendon Collar)   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[employer]   Edu: HS   Activity: active on farm   Diet: good water, fruits/vegetables daily   Social Determinants of Radio broadcast assistant Strain:   . Difficulty of Paying Living Expenses:   Food Insecurity:   . Worried About Charity fundraiser in the Last Year:   . Arboriculturist in the Last Year:   Transportation Needs:   . Film/video editor (Medical):   Marland Kitchen Lack of Transportation (Non-Medical):   Physical Activity:   . Days of Exercise per Week:   . Minutes of Exercise per Session:   Stress:     . Feeling of Stress :   Social Connections:   . Frequency of Communication with Friends and Family:   . Frequency of Social Gatherings with Friends and Family:   . Attends Religious Services:   . Active Member of Clubs or Organizations:   . Attends Archivist Meetings:   Marland Kitchen Marital Status:   Intimate Partner Violence:   . Fear of Current or Ex-Partner:   . Emotionally Abused:   Marland Kitchen Physically Abused:   . Sexually Abused:     Review of Systems: See HPI, otherwise negative ROS  Physical Exam: BP 135/84   Pulse 60   Temp (!) 96.5 F (35.8 C) (Tympanic)   Resp 16   Ht 5\' 7"  (1.702 m)   Wt 88.9 kg   SpO2 100%   BMI 30.70 kg/m  General:   Alert,  pleasant and cooperative in NAD Head:  Normocephalic and atraumatic. Neck:  Supple; no masses or thyromegaly. Lungs:  Clear throughout to auscultation, normal respiratory effort.    Heart:  +S1, +S2, Regular rate and rhythm, No edema. Abdomen:  Soft, nontender and nondistended. Normal bowel sounds, without guarding, and without rebound.   Neurologic:  Alert and  oriented x4;  grossly normal neurologically.  Impression/Plan: Caleb Smith is here for a colonoscopy to be performed for fecal occult blood test.  Risks, benefits, limitations, and alternatives regarding  colonoscopy have been reviewed with the patient.  Questions have been answered.  All parties agreeable.   Virgel Manifold, MD  02/14/2020, 8:43 AM

## 2020-02-14 NOTE — Op Note (Signed)
[facility] Patient Name: Caleb Smith Procedure Date: 02/14/2020 8:14 AM MRN: [id number] Account #: [id number] Date of Birth: August 11, 1950 Admit Type: Outpatient Age: 69 Room: [facility] ENDO ROOM 2 Gender: Male Note Status: Finalized Procedure:             Colonoscopy Indications:           Gastrointestinal occult blood loss Providers:             [name] B. Bonna Gains MD, MD Referring MD:          Ria Bush (Referring MD) Medicines:             Monitored Anesthesia Care Complications:         No immediate complications. Procedure:             Pre-Anesthesia Assessment:                        - ASA Grade Assessment: II - A patient with mild                         systemic disease.                        - Prior to the procedure, a History and Physical was                         performed, and patient medications, allergies and                         sensitivities were reviewed. The patient's tolerance                         of previous anesthesia was reviewed.                        - The risks and benefits of the procedure and the                         sedation options and risks were discussed with the                         patient. All questions were answered and informed                         consent was obtained.                        - Patient identification and proposed procedure were                         verified prior to the procedure by the physician, the                         nurse, the anesthesiologist, the anesthetist and the                         technician. The procedure was verified in the                         procedure room.  After obtaining informed consent, the colonoscope was                         passed under direct vision. Throughout the procedure,                         the patient's blood pressure, pulse, and oxygen                         saturations were monitored continuously.  The                         Colonoscope was introduced through the anus and                         advanced to the the cecum, identified by appendiceal                         orifice and ileocecal valve. The colonoscopy was                         performed with ease. The patient tolerated the                         procedure well. The quality of the bowel preparation                         was good. Findings:      The perianal and digital rectal examinations were normal.      Four flat polyps were found in the transverse colon, ascending colon and       cecum. The polyps were 5 to 7 mm in size. These polyps were removed with       a cold snare. Resection and retrieval were complete.      A 15 to 20 mm polyp was found at 45 cm proximal to the anus. The polyp       was flat. Area was tattooed with an injection of Spot (carbon black).       The tattoo was placed one fold proximal to the polyp. The polyp was not       removed.      The exam was otherwise without abnormality.      The rectum, sigmoid colon, descending colon, transverse colon, ascending       colon and cecum appeared normal.      The retroflexed view of the distal rectum and anal verge was normal and       showed no anal or rectal abnormalities. Impression:            - Four 5 to 7 mm polyps in the transverse colon, in                         the ascending colon and in the cecum, removed with a                         cold snare. Resected and retrieved.                        - One 15 to 20 mm polyp at 45  cm proximal to the anus.                         Tattooed.                        - The examination was otherwise normal.                        - The rectum, sigmoid colon, descending colon,                         transverse colon, ascending colon and cecum are normal.                        - The distal rectum and anal verge are normal on                         retroflexion view. Recommendation:        - Discharge  patient to home (with escort).                        - Advance diet as tolerated.                        - Continue present medications.                        - Await pathology results.                        - Repeat colonoscopy at [facility] for removal of the polyp.                        - The findings and recommendations were discussed with                         the patient.                        - The findings and recommendations were discussed with                         the patient's family.                        - Return to primary care physician as previously                         scheduled.                        - Return to my office in 4 weeks. Procedure Code(s):     --- Professional ---                        [phone], Colonoscopy, flexible; with removal of                         tumor(s), polyp(s), or other lesion(s) by snare  technique                        [id number], Colonoscopy, flexible; with directed submucosal                         injection(s), any substance Diagnosis Code(s):     --- Professional ---                        K63.5, Polyp of colon                        R19.5, Other fecal abnormalities CPT copyright 2019 American Medical Association. All rights reserved. The codes documented in this report are preliminary and upon coder review may  be revised to meet current compliance requirements.  Vonda Antigua, MD Margretta Sidle B. Bonna Gains MD, MD 02/14/2020 9:34:38 AM This report has been signed electronically. Number of Addenda: 0 Note Initiated On: 02/14/2020 8:14 AM Scope Withdrawal Time: 0 hours 27 minutes 35 seconds  Total Procedure Duration: 0 hours 34 minutes 11 seconds  Estimated Blood Loss:  Estimated blood loss: none.      [facility]

## 2020-02-14 NOTE — Telephone Encounter (Signed)
-----   Message from Virgel Manifold, MD sent at 02/14/2020 10:06 AM EDT ----- Please refer to [facility] for large colon polyp removal. Please create phone follow up with me in 4-6 weeks

## 2020-02-14 NOTE — Anesthesia Procedure Notes (Signed)
Date/Time: 02/14/2020 8:45 AM Performed by: Doreen Salvage, CRNA Pre-anesthesia Checklist: Patient identified, Emergency Drugs available, Suction available and Patient being monitored Patient Re-evaluated:Patient Re-evaluated prior to induction Oxygen Delivery Method: Nasal cannula Induction Type: IV induction Dental Injury: Teeth and Oropharynx as per pre-operative assessment  Comments: Nasal cannula with etCO2 monitoring

## 2020-02-14 NOTE — Anesthesia Postprocedure Evaluation (Signed)
Anesthesia Post Note  Patient: Caleb Smith  Procedure(s) Performed: COLONOSCOPY WITH PROPOFOL (N/A )  Patient location during evaluation: Endoscopy Anesthesia Type: General Level of consciousness: awake and alert Pain management: pain level controlled Vital Signs Assessment: post-procedure vital signs reviewed and stable Respiratory status: spontaneous breathing and respiratory function stable Cardiovascular status: stable Anesthetic complications: no   No complications documented.   Last Vitals:  Vitals:   02/14/20 0747 02/14/20 0930  BP: 135/84 112/73  Pulse: 60 63  Resp: 16 15  Temp: (!) 35.8 C (!) 36.1 C  SpO2: 100% 97%    Last Pain:  Vitals:   02/14/20 0930  TempSrc: Temporal  PainSc: 0-No pain                 Nashea Chumney K

## 2020-02-15 ENCOUNTER — Encounter: Payer: Self-pay | Admitting: Gastroenterology

## 2020-02-15 LAB — SURGICAL PATHOLOGY

## 2020-02-16 ENCOUNTER — Encounter: Payer: Self-pay | Admitting: Family Medicine

## 2020-02-16 ENCOUNTER — Telehealth: Payer: Self-pay

## 2020-02-16 NOTE — Telephone Encounter (Signed)
Called patient to let him know about his precancerous polyps and that he was referred to [facility] to have a polyp removed. Patient was also told that he needed a follow up appointment with Dr. Bonna Gains in 6 weeks. I also told him that Elmyra Ricks would give him a call to schedule his appointment. Patient understood and had no further questions.

## 2020-02-16 NOTE — Telephone Encounter (Signed)
-----   Message from Virgel Manifold, MD sent at 02/15/2020  1:09 PM EDT ----- Caleb Smith please let the patient know, the polyps removed from his colon were precancerous.  He needs follow-up at [facility] for the polyp that was unable to be removed.  Please set up phone follow-up with me in 4 to 6 weeks

## 2020-03-20 ENCOUNTER — Encounter: Payer: Self-pay | Admitting: Gastroenterology

## 2020-03-20 ENCOUNTER — Telehealth (INDEPENDENT_AMBULATORY_CARE_PROVIDER_SITE_OTHER): Payer: BC Managed Care – PPO | Admitting: Gastroenterology

## 2020-03-20 DIAGNOSIS — Z86010 Personal history of colonic polyps: Secondary | ICD-10-CM

## 2020-03-20 NOTE — Telephone Encounter (Signed)
[facility] and they stated that they do have an order for the patient and that they were going to call him soon to schedule him an appointment. I will notify patient about expecting a call from [facility] today as per [facility] scheduler.

## 2020-03-21 NOTE — Progress Notes (Signed)
Vonda Antigua, MD [address]  [place]  [place], [place] [address]  Main: [phone]  Fax: [phone]   Primary Care Physician: Ria Bush, MD  Virtual Visit via Telephone Note  I connected with patient on 03/21/20 at  2:30 PM EDT by telephone and verified that I am speaking with the correct person using two identifiers.   I discussed the limitations, risks, security and privacy concerns of performing an evaluation and management service by telephone and the availability of in person appointments. I also discussed with the patient that there may be a patient responsible charge related to this service. The patient expressed understanding and agreed to proceed.  Location of Patient: Home Location of Provider: Home Persons involved: Patient and provider only during the visit (nursing staff and front desk staff was involved in communicating with the patient prior to the appointment, reviewing medications and checking them in)   History of Present Illness: Chief complaint: Colon polyps  HPI: Caleb Smith is a 69 y.o. male who underwent colonoscopy in August 2021 due to positive FOBT, and was found to have multiple polyps.  Four subcentimeter polyps were removed from the transverse colon via cold snare.  However, a 15 to 20 mm polyp seen at 45 cm from the anal verge was not able to be removed and the area was tattooed.  Patient was referred to [facility] for removal and has not heard from them. The patient denies abdominal or flank pain, anorexia, nausea or vomiting, dysphagia, change in bowel habits or black or bloody stools or weight loss.   Current Outpatient Medications  Medication Sig Dispense Refill  . ASPIRIN LOW DOSE 81 MG chewable tablet CHEW 1 TABLET (81 MG TOTAL) BY MOUTH DAILY. 30 tablet 1  . atorvastatin (LIPITOR) 80 MG tablet TAKE 1 TABLET BY MOUTH EVERY DAY 90 tablet 3  . cholecalciferol (VITAMIN D) 1000 units tablet Take 1,000 Units by mouth daily.     Marland Kitchen ezetimibe (ZETIA) 10 MG tablet TAKE 1 TABLET BY MOUTH EVERY DAY 90 tablet 3  . Omega-3 Fatty Acids (FISH OIL) 1200 MG CAPS Take 1,200 mg by mouth daily.    Marland Kitchen omeprazole (PRILOSEC) 40 MG capsule TAKE 1 CAPSULE BY MOUTH EVERY DAY 90 capsule 3  . SYNTHROID 100 MCG tablet TAKE 1 TABLET (100 MCG TOTAL) BY MOUTH DAILY BEFORE BREAKFAST. 90 tablet 3  . vitamin B-12 (CYANOCOBALAMIN) 1000 MCG tablet Take 1,000 mcg by mouth daily.    Marland Kitchen amoxicillin (AMOXIL) 500 MG tablet Take 2g 1 hour prior to colonoscopy procedure (Patient not taking: Reported on 03/20/2020) 10 tablet 0   No current facility-administered medications for this visit.    Allergies as of 03/20/2020  . (No Known Allergies)    Review of Systems:    All systems reviewed and negative except where noted in HPI.   Observations/Objective:  Labs: CMP     Component Value Date/Time   NA 137 11/18/2019 0751   NA 140 07/17/2014 0000   K 4.1 11/18/2019 0751   K 5.1 07/17/2014 0000   CL 104 11/18/2019 0751   CO2 27 11/18/2019 0751   GLUCOSE 101 (H) 11/18/2019 0751   BUN 19 11/18/2019 0751   BUN 16 07/17/2014 0000   CREATININE 1.19 11/18/2019 0751   CREATININE 1.1 07/17/2014 0000   CALCIUM 9.3 11/18/2019 0751   PROT 6.5 11/18/2019 0751   PROT 7.0 08/21/2017 1023   ALBUMIN 4.3 11/18/2019 0751   ALBUMIN 4.6 08/21/2017 1023  AST 19 11/18/2019 0751   AST 19 03/25/2013 0000   ALT 31 11/18/2019 0751   ALT 24 03/25/2013 0000   ALKPHOS 62 11/18/2019 0751   ALKPHOS 46 03/25/2013 0000   BILITOT 0.6 11/18/2019 0751   BILITOT 0.4 08/21/2017 1023   GFRNONAA 54 (L) 08/05/2019 1014   GFRAA >60 08/05/2019 1014   GFRAA 82.3 07/17/2014 0000   Lab Results  Component Value Date   WBC 6.7 08/05/2019   HGB 14.8 08/05/2019   HCT 46.4 08/05/2019   MCV 97.5 08/05/2019   PLT 275 08/05/2019    Imaging Studies: No results found.  Assessment and Plan:   Caleb Smith is a 69 y.o. y/o male with recent positive FOBT and colonoscopy  showing a large 15 to 20 mm colon polyp that was unable to be removed and was tattooed, and 4 other subcentimeter polyps that were removed here for follow-up  Assessment and Plan: We have reached out to [facility] today as he has not heard from them to schedule his colonoscopy for the above  They state they will be calling the patient today to set up an appointment  I have discussed the with the patient in detail and advised him to follow-up with [facility] closely as this polyp can grow into cancer and he verbalized understanding  Follow-up in clinic closely also to ensure appropriate surveillance  Follow Up Instructions:    I discussed the assessment and treatment plan with the patient. The patient was provided an opportunity to ask questions and all were answered. The patient agreed with the plan and demonstrated an understanding of the instructions.   The patient was advised to call back or seek an in-person evaluation if the symptoms worsen or if the condition fails to improve as anticipated.  I provided 12 minutes of non-face-to-face time during this encounter. Additional time was spent in reviewing patient's chart, placing orders etc.   Virgel Manifold, MD  Speech recognition software was used to dictate this note.

## 2020-04-02 ENCOUNTER — Telehealth: Payer: Self-pay | Admitting: Gastroenterology

## 2020-04-02 NOTE — Telephone Encounter (Signed)
Patients wife calling stating pt has still not heard from [facility] about scheduling an appt to remove his polyp. Pts wife states if there is somewhere else in [place] or [place] it would be fine to send him there. Please advise and let pt know. See phone note from 8.19.21

## 2020-04-02 NOTE — Telephone Encounter (Signed)
Called [facility] procedures first to ask about the referral and they stated that they have been slammed with referrals. However, I was told that they would contact patient within 3-5 days to schedule an appointment.  I then called patient to let him know what was going on and he stated that he prefers to go to the [place] area since all of his family works and travels to [place] on a daily basis. He wants to be referred to [facility] if all possible instead of [facility]. Any suggestions?

## 2020-04-10 NOTE — Telephone Encounter (Signed)
[facility] is requesting a color copy of colonoscopy as STAT to be mailed to them since they scheduled the procedure for the patient. This will be mailed. They have contacted the patient with procedure date.

## 2020-04-12 ENCOUNTER — Telehealth: Payer: Self-pay

## 2020-04-12 DIAGNOSIS — K635 Polyp of colon: Secondary | ICD-10-CM

## 2020-04-12 NOTE — Telephone Encounter (Signed)
I received the following Teams message from pt's daughter, Lendon Collar (on dpr) asking for assistance:  My dad Caleb Smith (DOB 11/02/2050) had a colonoscopy in August and had several polyps removed. The doctor found one that was very large and told him that it would have to removed surgically. She expected it to be done soon. He followed back up with the doctor that did his colonoscopy a month later and she was VERY surprised it had not been removed yet. Dad has personally called twice with no response back. He is getting agrivated at this point because they have stressed to him that this is urgent, but he can't get through to the people that he was referred to at [facility]. Is there ANYWAY an urgent referral can be put in for someone in [place], per his request?  Ander Purpura is aware Dr. Darnell Level is out of the office and her message will be fwd.

## 2020-04-13 NOTE — Addendum Note (Signed)
Addended by: Ria Bush on: 04/13/2020 08:08 AM   Modules accepted: Orders

## 2020-04-13 NOTE — Telephone Encounter (Addendum)
New referral placed for [facility] per pt request.

## 2020-04-13 NOTE — Telephone Encounter (Signed)
Notified Lauren referral was placed.  Offered to inform pt.  Says she will do it.  She expresses her thanks.

## 2020-04-17 ENCOUNTER — Telehealth: Payer: Self-pay | Admitting: Gastroenterology

## 2020-04-17 NOTE — Telephone Encounter (Signed)
05/11/20 at 3:50 pm appt made with Dr Rush Landmark.  The pt has been advised. The pt states he would like to wait and discuss scheduling any procedures until seen in the office.

## 2020-04-17 NOTE — Telephone Encounter (Signed)
Patty, I have reviewed the report from last Colonoscopy. He has a large polyp that will need an EMR scheduled. Looks like a lot of back/forth with [facility], so it never got scheduled. Please set up a clinic visit - please double book or place a 350PM slot next available for patient. Also go ahead and get set up for a Colonoscopy with EMR 90 minute slot so that we can get him on the books. Please let me know when the date for clinic visit is.  If need to do a telemedicine visit at an end of day, I am OK with that as well. Thanks. GM

## 2020-04-17 NOTE — Telephone Encounter (Signed)
Patty, Thanks for getting that clinic visit set up. I think because of how long it takes [place] to usually get a spot it is most reasonable for [place] to go ahead and get a procedure date down for the patient as a 90-minute colon with EMR. I actually called and spoke with the patient and talk with him briefly as to why I would try and leave at least have a date down on the books and so he is okay and understands the reasoning to have a potential date for resection and agrees to having that scheduled. Patty go ahead and call him back later today or tomorrow and offer him a 90-minute colon with EMR slot. Thanks. GM

## 2020-04-17 NOTE — Telephone Encounter (Signed)
Hey Dr Rush Landmark this pt is being referred by [facility] for a colonoscopy, this pt had a colonoscopy done at [facility] in 01/2020 and is being referred for another one, notes are in epic for review, please advise on scheduling.

## 2020-04-18 ENCOUNTER — Other Ambulatory Visit: Payer: Self-pay

## 2020-04-18 DIAGNOSIS — Z86010 Personal history of colonic polyps: Secondary | ICD-10-CM

## 2020-04-18 NOTE — Telephone Encounter (Signed)
Colon EMR scheduled for 06/07/20 at 1130 am at [facility] with Dr Rush Landmark.  Pt will be given appt information at office visit.

## 2020-04-18 NOTE — Telephone Encounter (Signed)
The patient has been notified of this information and all questions answered.

## 2020-05-11 ENCOUNTER — Encounter: Payer: Self-pay | Admitting: Gastroenterology

## 2020-05-11 ENCOUNTER — Ambulatory Visit: Payer: BC Managed Care – PPO | Admitting: Gastroenterology

## 2020-05-11 ENCOUNTER — Other Ambulatory Visit (INDEPENDENT_AMBULATORY_CARE_PROVIDER_SITE_OTHER): Payer: BC Managed Care – PPO

## 2020-05-11 VITALS — BP 120/60 | HR 66 | Ht 67.0 in | Wt 206.0 lb

## 2020-05-11 DIAGNOSIS — R933 Abnormal findings on diagnostic imaging of other parts of digestive tract: Secondary | ICD-10-CM

## 2020-05-11 DIAGNOSIS — Z86010 Personal history of colonic polyps: Secondary | ICD-10-CM

## 2020-05-11 DIAGNOSIS — D124 Benign neoplasm of descending colon: Secondary | ICD-10-CM

## 2020-05-11 LAB — CBC
HCT: 44.1 % (ref 39.0–52.0)
Hemoglobin: 14.8 g/dL (ref 13.0–17.0)
MCHC: 33.6 g/dL (ref 30.0–36.0)
MCV: 93.6 fl (ref 78.0–100.0)
Platelets: 273 10*3/uL (ref 150.0–400.0)
RBC: 4.71 Mil/uL (ref 4.22–5.81)
RDW: 14.5 % (ref 11.5–15.5)
WBC: 6.6 10*3/uL (ref 4.0–10.5)

## 2020-05-11 LAB — PROTIME-INR
INR: 1 ratio (ref 0.8–1.0)
Prothrombin Time: 11.3 s (ref 9.6–13.1)

## 2020-05-11 LAB — BASIC METABOLIC PANEL
BUN: 14 mg/dL (ref 6–23)
CO2: 29 mEq/L (ref 19–32)
Calcium: 9.3 mg/dL (ref 8.4–10.5)
Chloride: 103 mEq/L (ref 96–112)
Creatinine, Ser: 1.5 mg/dL (ref 0.40–1.50)
GFR: 47.55 mL/min — ABNORMAL LOW (ref >60.00)
Glucose, Bld: 86 mg/dL (ref 70–99)
Potassium: 4.3 mEq/L (ref 3.5–5.1)
Sodium: 139 mEq/L (ref 135–145)

## 2020-05-11 NOTE — Patient Instructions (Signed)
Your provider has requested that you go to the basement level for lab work before leaving today. Press "B" on the elevator. The lab is located at the first door on the left as you exit the elevator.  You have been scheduled for a colonoscopy. Please follow written instructions given to you at your visit today.  Please pick up your prep supplies at the pharmacy within the next 1-3 days. If you use inhalers (even only as needed), please bring them with you on the day of your procedure.  Due to recent changes in healthcare laws, you may see the results of your imaging and laboratory studies on MyChart before your provider has had a chance to review them.  We understand that in some cases there may be results that are confusing or concerning to you. Not all laboratory results come back in the same time frame and the provider may be waiting for multiple results in order to interpret others.  Please give [place] 48 hours in order for your provider to thoroughly review all the results before contacting the office for clarification of your results.   If you are age 64 or older, your body mass index should be between 23-30. Your Body mass index is 32.26 kg/m. If this is out of the aforementioned range listed, please consider follow up with your Primary Care Provider.  If you are age 87 or younger, your body mass index should be between 19-25. Your Body mass index is 32.26 kg/m. If this is out of the aformentioned range listed, please consider follow up with your Primary Care Provider.    Thank you for choosing me and [facility].  Dr. Rush Landmark

## 2020-05-14 ENCOUNTER — Encounter: Payer: Self-pay | Admitting: Gastroenterology

## 2020-05-14 DIAGNOSIS — D124 Benign neoplasm of descending colon: Secondary | ICD-10-CM | POA: Insufficient documentation

## 2020-05-14 DIAGNOSIS — R933 Abnormal findings on diagnostic imaging of other parts of digestive tract: Secondary | ICD-10-CM | POA: Insufficient documentation

## 2020-05-14 DIAGNOSIS — Z86010 Personal history of colonic polyps: Secondary | ICD-10-CM | POA: Insufficient documentation

## 2020-05-14 NOTE — Progress Notes (Signed)
[facility] VISIT   Primary Care Provider Ria Bush, MD [place]  [id number] [phone]  Referring Provider Dr. Bonna Gains  Patient Profile: Caleb Smith is a 69 y.o. male with a pmh significant for CAD, hyperlipidemia, hypothyroidism, obesity, family history of pancreatic cancer (mother), GERD, colon polyps.  The patient presents to the [facility] for an evaluation and management of problem(s) noted below:  Problem List 1. Adenomatous polyp of descending colon   2. History of colonic polyps   3. Abnormal colonoscopy     History of Present Illness The patient underwent a diagnostic colonoscopy in August 2021 as a result of a positive FOBT.  His colonoscopy showed evidence of a polypoid mass as well as other polyps.  These were precancerous.  The larger polyp was not biopsied.  Patient was referred to [facility].  Unfortunately over the course the last few months the patient never heard from [facility].  Is for this reason patient is referred for consideration of advanced resection of the large polyp.  Patient is accompanied by his daughter.  The patient is ready to have this procedure done as he just wants to know that the skin be removed.  He has no abdominal pain.  He denies any fevers or chills.  The patient has not had any other changes in his bowel habits.  GI Review of Systems Positive as above Negative for dysphagia, odynophagia, pain, melena, hematochezia  Review of Systems General: Denies fevers/chills/weight loss unintentionally HEENT: Denies oral lesions Cardiovascular: Denies chest pain/palpitations Pulmonary: Denies shortness of breath Gastroenterological: See HPI Genitourinary: Denies darkened urine Hematological: Denies easy bruising/bleeding Dermatological: Denies jaundice Psychological: Mood is stable   Medications Current Outpatient Medications  Medication Sig Dispense Refill  .  ASPIRIN LOW DOSE 81 MG chewable tablet CHEW 1 TABLET (81 MG TOTAL) BY MOUTH DAILY. 30 tablet 1  . atorvastatin (LIPITOR) 80 MG tablet TAKE 1 TABLET BY MOUTH EVERY DAY 90 tablet 3  . cholecalciferol (VITAMIN D) 1000 units tablet Take 1,000 Units by mouth daily.    Marland Kitchen ezetimibe (ZETIA) 10 MG tablet TAKE 1 TABLET BY MOUTH EVERY DAY 90 tablet 3  . Omega-3 Fatty Acids (FISH OIL) 1200 MG CAPS Take 1,200 mg by mouth daily.    Marland Kitchen omeprazole (PRILOSEC) 40 MG capsule TAKE 1 CAPSULE BY MOUTH EVERY DAY 90 capsule 3  . SYNTHROID 100 MCG tablet TAKE 1 TABLET (100 MCG TOTAL) BY MOUTH DAILY BEFORE BREAKFAST. 90 tablet 3  . vitamin B-12 (CYANOCOBALAMIN) 1000 MCG tablet Take 1,000 mcg by mouth daily.    Marland Kitchen amoxicillin (AMOXIL) 500 MG tablet Take 2g 1 hour prior to colonoscopy procedure (Patient not taking: Reported on 03/20/2020) 10 tablet 0   No current facility-administered medications for this visit.    Allergies No Known Allergies  Histories Past Medical History:  Diagnosis Date  . Arthritis   . Coronary artery calcification seen on CAT scan 08/21/2017  . GERD (gastroesophageal reflux disease) 1995   s/p esoph dilation for stricture  . HLD (hyperlipidemia)   . Hypothyroidism   . Obesity 07/14/2014   Past Surgical History:  Procedure Laterality Date  . CARDIOVASCULAR STRESS TEST  07/2017   low risk study ([facility])  . COLONOSCOPY  03/2003   small sigmoid polyp, rpt 5 yrs [name])  . COLONOSCOPY WITH PROPOFOL N/A 02/14/2020   [facility] (Bonna Gains, Lennette Bihari, MD)  . ESOPHAGOGASTRODUODENOSCOPY  1995   s/p esophageal dilation  . FACIAL RECONSTRUCTION SURGERY  1990s   cow headbutted him  . FINGER SURGERY Left teenager   reattachment of 2nd,3rd,4th  . TONSILLECTOMY    . 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 Marital status: Married    Spouse name: Not on file  . Number of children: Not on file  . Years of education: Not on file  . Highest education level: Not on file  Occupational History  . Not on file  Tobacco Use  . Smoking status: Never Smoker  . Smokeless tobacco: Former Systems developer    Types: Secondary school teacher  . Vaping Use: Never used  Substance and Sexual Activity  . Alcohol use: Yes    Alcohol/week: 0.0 standard drinks    Comment: 2-3 beer daily  . Drug use: No  . Sexual activity: Not on file  Other Topics Concern  . Not on file  Social History Narrative   Lives with wife   Grown children (daughter is Lendon Collar)   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[employer]   Edu: HS   Activity: active on farm   Diet: good water, fruits/vegetables daily   Social Determinants of Radio broadcast assistant Strain:   . Difficulty of Paying Living Expenses: Not on file  Food Insecurity:   . Worried About Charity fundraiser in the Last Year: Not on file  . Ran Out of Food in the Last Year: Not on file  Transportation Needs:   . Lack of Transportation (Medical): Not on file  . Lack of Transportation (Non-Medical): Not on file  Physical Activity:   . Days of Exercise per Week: Not on file  . Minutes of Exercise per Session: Not on file  Stress:   . Feeling of Stress : Not on file  Social Connections:   . Frequency of Communication with Friends and Family: Not on file  . Frequency of Social Gatherings with Friends and Family: Not on file  . Attends Religious Services: Not on file  . Active Member of Clubs or Organizations: Not on file  . Attends Archivist Meetings: Not on file  . Marital Status: Not on file  Intimate Partner Violence:   . Fear of Current or Ex-Partner: Not on file  . Emotionally Abused: Not on file  . Physically Abused: Not on file  . Sexually Abused: Not on file   Family History  Problem Relation  Age of Onset  . Cancer Mother        breast  . Pancreatic cancer Mother   . Liver cancer Mother   . CAD Maternal Uncle 71       massive MI  . Suicidality Father        suicide  . Stroke Neg Hx   . Diabetes Neg Hx   . Hypertension Neg Hx   . Colon cancer Neg Hx   . Esophageal cancer Neg Hx   . Inflammatory bowel disease Neg Hx   . Rectal cancer Neg Hx   . Stomach cancer Neg Hx    I have reviewed his medical, social, and family history in detail and updated the electronic medical record as necessary.    PHYSICAL EXAMINATION  BP 120/60   Pulse 66   Ht 5\' 7"  (1.702 m)   Wt  206 lb (93.4 kg)   SpO2 95%   BMI 32.26 kg/m  Wt Readings from Last 3 Encounters:  05/11/20 206 lb (93.4 kg)  02/14/20 196 lb (88.9 kg)  11/25/19 203 lb 1 oz (92.1 kg)  GEN: NAD, appears stated age, doesn't appear chronically ill PSYCH: Cooperative, without pressured speech EYE: Conjunctivae pink, sclerae anicteric ENT: MMM, without oral ulcers, no erythema or exudates noted CV: Nontachycardic RESP: No audible wheezing GI: NABS, soft, NT/ND, without rebound or guarding MSK/EXT: No lower extremity edema SKIN: No jaundice NEURO:  Alert & Oriented x 3, no focal deficits   REVIEW OF DATA  I reviewed the following data at the time of this encounter:  GI Procedures and Studies  August 2021 colonoscopy - Four 5 to 7 mm polyps in the transverse colon, in the ascending colon and in the cecum, removed with a cold snare. Resected and retrieved. - One 15 to 20 mm polyp at 45 cm proximal to the anus. Tattooed. - The examination was otherwise normal. - The rectum, sigmoid colon, descending colon, transverse colon, ascending colon and cecum are normal. - The distal rectum and anal verge are normal on retroflexion view. Pathology DIAGNOSIS:  A. COLON POLYPS X4, CECUM (X1), ASCENDING (X2), AND TRANSVERSE (X1);  COLD SNARES:  - FRAGMENTS (X8) OF SESSILE SERRATED POLYPS.  - NEGATIVE FOR DYSPLASIA AND  MALIGNANCY.  Laboratory Studies  Reviewed those in epic  Imaging Studies  No relevant studies to review   ASSESSMENT  Mr. Chio is a 69 y.o. male with a pmh significant for The patient is seen today for evaluation and management of:  1. Adenomatous polyp of descending colon   2. History of colonic polyps   3. Abnormal colonoscopy    The patient is hemodynamically and clinically stable.  Based upon the description and endoscopic pictures I do feel that it is reasonable to pursue an Advanced Polypectomy attempt of the polyp/lesion.  We discussed some of the techniques of advanced polypectomy which include Endoscopic Mucosal Resection, OVESCO Full-Thickness Resection, Endorotor Morcellation, and Tissue Ablation via Fulguration.  The risks and benefits of endoscopic evaluation were discussed with the patient; these include but are not limited to the risk of perforation, infection, bleeding, missed lesions, lack of diagnosis, severe illness requiring hospitalization, as well as anesthesia and sedation related illnesses.  During attempts at advanced resection, the risks of bleeding and perforation/leak are increased as opposed to diagnostic and screening procedures, and that was discussed with the patient as well.   In addition, I explained that with the possible need for piecemeal resection, subsequent short-interval endoscopic evaluation for follow up and potential retreatment of the lesion/area may be necessary.  I did offer, a referral to surgery in order for patient to have opportunity to discuss surgical management/intervention prior to finalizing decision for attempt at endoscopic removal, however, the patient deferred on this.  If, after attempt at removal of the polyp/lesion, it is found that the patient has a complication or that an invasive lesion or malignant lesion is found, or that the polyp/lesion continues to recur, the patient is aware and understands that surgery may still be  indicated/required.  All patient questions were answered, to the best of my ability, and the patient agrees to the aforementioned plan of action with follow-up as indicated.   PLAN  Proceed with the scheduled colonoscopy with EMR slot Preprocedure labs to be obtained Follow-up to be dictated based on findings   Orders Placed This Encounter  Procedures  .  CBC  . Basic Metabolic Panel (BMET)  . INR/PT    New Prescriptions   No medications on file   Modified Medications   No medications on file    Planned Follow Up No follow-ups on file.   Total Time in Face-to-Face and in Coordination of Care for patient including independent/personal interpretation/review of prior testing, medical history, examination, medication adjustment, communicating results with the patient directly, and documentation with the EHR is 45 minutes.   Justice Britain, MD [facility] Advanced Endoscopy Office # [phone]

## 2020-05-14 NOTE — H&P (View-Only) (Signed)
Blunt VISIT   Primary Care Provider Ria Bush, MD [place] [address] [phone]  Referring Provider Dr. Bonna Gains  Patient Profile: Caleb Smith is a 69 y.o. male with a pmh significant for CAD, hyperlipidemia, hypothyroidism, obesity, family history of pancreatic cancer (mother), GERD, colon polyps.  The patient presents to the [facility] : [facility] for an evaluation and management of problem(s) noted below:  Problem List 1. Adenomatous polyp of descending colon   2. History of colonic polyps   3. Abnormal colonoscopy     History of Present Illness The patient underwent a diagnostic colonoscopy in August 2021 as a result of a positive FOBT.  His colonoscopy showed evidence of a polypoid mass as well as other polyps.  These were precancerous.  The larger polyp was not biopsied.  Patient was referred to [facility].  Unfortunately over the course the last few months the patient never heard from [facility].  Is for this reason patient is referred for consideration of advanced resection of the large polyp.  Patient is accompanied by his daughter.  The patient is ready to have this procedure done as he just wants to know that the skin be removed.  He has no abdominal pain.  He denies any fevers or chills.  The patient has not had any other changes in his bowel habits.  GI Review of Systems Positive as above Negative for dysphagia, odynophagia, pain, melena, hematochezia  Review of Systems General: Denies fevers/chills/weight loss unintentionally HEENT: Denies oral lesions Cardiovascular: Denies chest pain/palpitations Pulmonary: Denies shortness of breath Gastroenterological: See HPI Genitourinary: Denies darkened urine Hematological: Denies easy bruising/bleeding Dermatological: Denies jaundice Psychological: Mood is stable   Medications Current Outpatient Medications  Medication Sig Dispense Refill  .  ASPIRIN LOW DOSE 81 MG chewable tablet CHEW 1 TABLET (81 MG TOTAL) BY MOUTH DAILY. 30 tablet 1  . atorvastatin (LIPITOR) 80 MG tablet TAKE 1 TABLET BY MOUTH EVERY DAY 90 tablet 3  . cholecalciferol (VITAMIN D) 1000 units tablet Take 1,000 Units by mouth daily.    Marland Kitchen ezetimibe (ZETIA) 10 MG tablet TAKE 1 TABLET BY MOUTH EVERY DAY 90 tablet 3  . Omega-3 Fatty Acids (FISH OIL) 1200 MG CAPS Take 1,200 mg by mouth daily.    Marland Kitchen omeprazole (PRILOSEC) 40 MG capsule TAKE 1 CAPSULE BY MOUTH EVERY DAY 90 capsule 3  . SYNTHROID 100 MCG tablet TAKE 1 TABLET (100 MCG TOTAL) BY MOUTH DAILY BEFORE BREAKFAST. 90 tablet 3  . vitamin B-12 (CYANOCOBALAMIN) 1000 MCG tablet Take 1,000 mcg by mouth daily.    Marland Kitchen amoxicillin (AMOXIL) 500 MG tablet Take 2g 1 hour prior to colonoscopy procedure (Patient not taking: Reported on 03/20/2020) 10 tablet 0   No current facility-administered medications for this visit.    Allergies No Known Allergies  Histories Past Medical History:  Diagnosis Date  . Arthritis   . Coronary artery calcification seen on CAT scan 08/21/2017  . GERD (gastroesophageal reflux disease) 1995   s/p esoph dilation for stricture  . HLD (hyperlipidemia)   . Hypothyroidism   . Obesity 07/14/2014   Past Surgical History:  Procedure Laterality Date  . CARDIOVASCULAR STRESS TEST  07/2017   low risk study ([facility])  . COLONOSCOPY  03/2003   small sigmoid polyp, rpt 5 yrs [name])  . COLONOSCOPY WITH PROPOFOL N/A 02/14/2020   [facility] (Bonna Gains, Lennette Bihari, MD)  . ESOPHAGOGASTRODUODENOSCOPY  1995   s/p esophageal dilation  . FACIAL RECONSTRUCTION SURGERY  1990s   cow headbutted him  . FINGER SURGERY Left teenager   reattachment of 2nd,3rd,4th  . TONSILLECTOMY    . 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 Marital status: Married    Spouse name: Not on file  . Number of children: Not on file  . Years of education: Not on file  . Highest education level: Not on file  Occupational History  . Not on file  Tobacco Use  . Smoking status: Never Smoker  . Smokeless tobacco: Former Systems developer    Types: Secondary school teacher  . Vaping Use: Never used  Substance and Sexual Activity  . Alcohol use: Yes    Alcohol/week: 0.0 standard drinks    Comment: 2-3 beer daily  . Drug use: No  . Sexual activity: Not on file  Other Topics Concern  . Not on file  Social History Narrative   Lives with wife   Grown children (daughter is Caleb Smith)   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[employer]   Edu: HS   Activity: active on farm   Diet: good water, fruits/vegetables daily   Social Determinants of Radio broadcast assistant Strain:   . Difficulty of Paying Living Expenses: Not on file  Food Insecurity:   . Worried About Charity fundraiser in the Last Year: Not on file  . Ran Out of Food in the Last Year: Not on file  Transportation Needs:   . Lack of Transportation (Medical): Not on file  . Lack of Transportation (Non-Medical): Not on file  Physical Activity:   . Days of Exercise per Week: Not on file  . Minutes of Exercise per Session: Not on file  Stress:   . Feeling of Stress : Not on file  Social Connections:   . Frequency of Communication with Friends and Family: Not on file  . Frequency of Social Gatherings with Friends and Family: Not on file  . Attends Religious Services: Not on file  . Active Member of Clubs or Organizations: Not on file  . Attends Archivist Meetings: Not on file  . Marital Status: Not on file  Intimate Partner Violence:   . Fear of Current or Ex-Partner: Not on file  . Emotionally Abused: Not on file  . Physically Abused: Not on file  . Sexually Abused: Not on file   Family History  Problem Relation  Age of Onset  . Cancer Mother        breast  . Pancreatic cancer Mother   . Liver cancer Mother   . CAD Maternal Uncle 71       massive MI  . Suicidality Father        suicide  . Stroke Neg Hx   . Diabetes Neg Hx   . Hypertension Neg Hx   . Colon cancer Neg Hx   . Esophageal cancer Neg Hx   . Inflammatory bowel disease Neg Hx   . Rectal cancer Neg Hx   . Stomach cancer Neg Hx    I have reviewed his medical, social, and family history in detail and updated the electronic medical record as necessary.    PHYSICAL EXAMINATION  BP 120/60   Pulse 66   Ht 5\' 7"  (1.702 m)   Wt  206 lb (93.4 kg)   SpO2 95%   BMI 32.26 kg/m  Wt Readings from Last 3 Encounters:  05/11/20 206 lb (93.4 kg)  02/14/20 196 lb (88.9 kg)  11/25/19 203 lb 1 oz (92.1 kg)  GEN: NAD, appears stated age, doesn't appear chronically ill PSYCH: Cooperative, without pressured speech EYE: Conjunctivae pink, sclerae anicteric ENT: MMM, without oral ulcers, no erythema or exudates noted CV: Nontachycardic RESP: No audible wheezing GI: NABS, soft, NT/ND, without rebound or guarding MSK/EXT: No lower extremity edema SKIN: No jaundice NEURO:  Alert & Oriented x 3, no focal deficits   REVIEW OF DATA  I reviewed the following data at the time of this encounter:  GI Procedures and Studies  August 2021 colonoscopy - Four 5 to 7 mm polyps in the transverse colon, in the ascending colon and in the cecum, removed with a cold snare. Resected and retrieved. - One 15 to 20 mm polyp at 45 cm proximal to the anus. Tattooed. - The examination was otherwise normal. - The rectum, sigmoid colon, descending colon, transverse colon, ascending colon and cecum are normal. - The distal rectum and anal verge are normal on retroflexion view. Pathology DIAGNOSIS:  A. COLON POLYPS X4, CECUM (X1), ASCENDING (X2), AND TRANSVERSE (X1);  COLD SNARES:  - FRAGMENTS (X8) OF SESSILE SERRATED POLYPS.  - NEGATIVE FOR DYSPLASIA AND  MALIGNANCY.  Laboratory Studies  Reviewed those in epic  Imaging Studies  No relevant studies to review   ASSESSMENT  Mr. Schurman is a 69 y.o. male with a pmh significant for The patient is seen today for evaluation and management of:  1. Adenomatous polyp of descending colon   2. History of colonic polyps   3. Abnormal colonoscopy    The patient is hemodynamically and clinically stable.  Based upon the description and endoscopic pictures I do feel that it is reasonable to pursue an Advanced Polypectomy attempt of the polyp/lesion.  We discussed some of the techniques of advanced polypectomy which include Endoscopic Mucosal Resection, OVESCO Full-Thickness Resection, Endorotor Morcellation, and Tissue Ablation via Fulguration.  The risks and benefits of endoscopic evaluation were discussed with the patient; these include but are not limited to the risk of perforation, infection, bleeding, missed lesions, lack of diagnosis, severe illness requiring hospitalization, as well as anesthesia and sedation related illnesses.  During attempts at advanced resection, the risks of bleeding and perforation/leak are increased as opposed to diagnostic and screening procedures, and that was discussed with the patient as well.   In addition, I explained that with the possible need for piecemeal resection, subsequent short-interval endoscopic evaluation for follow up and potential retreatment of the lesion/area may be necessary.  I did offer, a referral to surgery in order for patient to have opportunity to discuss surgical management/intervention prior to finalizing decision for attempt at endoscopic removal, however, the patient deferred on this.  If, after attempt at removal of the polyp/lesion, it is found that the patient has a complication or that an invasive lesion or malignant lesion is found, or that the polyp/lesion continues to recur, the patient is aware and understands that surgery may still be  indicated/required.  All patient questions were answered, to the best of my ability, and the patient agrees to the aforementioned plan of action with follow-up as indicated.   PLAN  Proceed with the scheduled colonoscopy with EMR slot Preprocedure labs to be obtained Follow-up to be dictated based on findings   Orders Placed This Encounter  Procedures  .  CBC  . Basic Metabolic Panel (BMET)  . INR/PT    New Prescriptions   No medications on file   Modified Medications   No medications on file    Planned Follow Up No follow-ups on file.   Total Time in Face-to-Face and in Coordination of Care for patient including independent/personal interpretation/review of prior testing, medical history, examination, medication adjustment, communicating results with the patient directly, and documentation with the EHR is 45 minutes.   Justice Britain, MD [facility] Advanced Endoscopy Office # [phone]

## 2020-06-04 ENCOUNTER — Other Ambulatory Visit (HOSPITAL_COMMUNITY)
Admission: RE | Admit: 2020-06-04 | Discharge: 2020-06-04 | Disposition: A | Discharge status: Home / Self Care | Payer: BC Managed Care – PPO | Source: Ambulatory Visit | Attending: Gastroenterology | Admitting: Gastroenterology

## 2020-06-04 DIAGNOSIS — K644 Residual hemorrhoidal skin tags: Secondary | ICD-10-CM | POA: Diagnosis not present

## 2020-06-04 DIAGNOSIS — Z79899 Other long term (current) drug therapy: Secondary | ICD-10-CM | POA: Diagnosis not present

## 2020-06-04 DIAGNOSIS — Z96651 Presence of right artificial knee joint: Secondary | ICD-10-CM | POA: Diagnosis not present

## 2020-06-04 DIAGNOSIS — Z01812 Encounter for preprocedural laboratory examination: Secondary | ICD-10-CM | POA: Insufficient documentation

## 2020-06-04 DIAGNOSIS — Z7982 Long term (current) use of aspirin: Secondary | ICD-10-CM | POA: Diagnosis not present

## 2020-06-04 DIAGNOSIS — C186 Malignant neoplasm of descending colon: Secondary | ICD-10-CM | POA: Diagnosis not present

## 2020-06-04 DIAGNOSIS — K648 Other hemorrhoids: Secondary | ICD-10-CM | POA: Diagnosis not present

## 2020-06-04 DIAGNOSIS — Z20822 Contact with and (suspected) exposure to covid-19: Secondary | ICD-10-CM | POA: Insufficient documentation

## 2020-06-04 DIAGNOSIS — D123 Benign neoplasm of transverse colon: Secondary | ICD-10-CM | POA: Diagnosis not present

## 2020-06-04 LAB — SARS CORONAVIRUS 2 (TAT 6-24 HRS): SARS Coronavirus 2: NEGATIVE

## 2020-06-06 NOTE — Progress Notes (Signed)
Pre op call done for endo procedure tomorrow 06/07/20. Patient states he has been quarantined since covid test, has clear instructions on colon prep, and has a driver taking him home post procedure. All questions addressed.

## 2020-06-07 ENCOUNTER — Ambulatory Visit (HOSPITAL_COMMUNITY): Payer: BC Managed Care – PPO | Admitting: Certified Registered Nurse Anesthetist

## 2020-06-07 ENCOUNTER — Ambulatory Visit (HOSPITAL_COMMUNITY)
Admission: RE | Admit: 2020-06-07 | Discharge: 2020-06-07 | Disposition: A | Discharge status: Home / Self Care | Payer: BC Managed Care – PPO | Attending: Gastroenterology | Admitting: Gastroenterology

## 2020-06-07 ENCOUNTER — Encounter (HOSPITAL_COMMUNITY): Payer: Self-pay | Admitting: Gastroenterology

## 2020-06-07 ENCOUNTER — Other Ambulatory Visit: Payer: Self-pay

## 2020-06-07 ENCOUNTER — Encounter (HOSPITAL_COMMUNITY)
Admission: RE | Disposition: A | Discharge status: Home / Self Care | Payer: Self-pay | Source: Home / Self Care | Attending: Gastroenterology

## 2020-06-07 DIAGNOSIS — K648 Other hemorrhoids: Secondary | ICD-10-CM | POA: Insufficient documentation

## 2020-06-07 DIAGNOSIS — Z7982 Long term (current) use of aspirin: Secondary | ICD-10-CM | POA: Diagnosis not present

## 2020-06-07 DIAGNOSIS — Z86010 Personal history of colonic polyps: Secondary | ICD-10-CM

## 2020-06-07 DIAGNOSIS — Z79899 Other long term (current) drug therapy: Secondary | ICD-10-CM | POA: Insufficient documentation

## 2020-06-07 DIAGNOSIS — E039 Hypothyroidism, unspecified: Secondary | ICD-10-CM | POA: Diagnosis not present

## 2020-06-07 DIAGNOSIS — E785 Hyperlipidemia, unspecified: Secondary | ICD-10-CM | POA: Diagnosis not present

## 2020-06-07 DIAGNOSIS — Z20822 Contact with and (suspected) exposure to covid-19: Secondary | ICD-10-CM | POA: Insufficient documentation

## 2020-06-07 DIAGNOSIS — D490 Neoplasm of unspecified behavior of digestive system: Secondary | ICD-10-CM | POA: Diagnosis not present

## 2020-06-07 DIAGNOSIS — C186 Malignant neoplasm of descending colon: Secondary | ICD-10-CM | POA: Diagnosis not present

## 2020-06-07 DIAGNOSIS — Z96651 Presence of right artificial knee joint: Secondary | ICD-10-CM | POA: Insufficient documentation

## 2020-06-07 DIAGNOSIS — K641 Second degree hemorrhoids: Secondary | ICD-10-CM | POA: Diagnosis not present

## 2020-06-07 DIAGNOSIS — K644 Residual hemorrhoidal skin tags: Secondary | ICD-10-CM | POA: Insufficient documentation

## 2020-06-07 DIAGNOSIS — D123 Benign neoplasm of transverse colon: Secondary | ICD-10-CM | POA: Diagnosis not present

## 2020-06-07 DIAGNOSIS — K635 Polyp of colon: Secondary | ICD-10-CM | POA: Diagnosis not present

## 2020-06-07 HISTORY — PX: HEMOSTASIS CLIP PLACEMENT: SHX6857

## 2020-06-07 HISTORY — PX: SUBMUCOSAL LIFTING INJECTION: SHX6855

## 2020-06-07 HISTORY — PX: POLYPECTOMY: SHX5525

## 2020-06-07 HISTORY — PX: COLONOSCOPY WITH PROPOFOL: SHX5780

## 2020-06-07 HISTORY — PX: ENDOSCOPIC MUCOSAL RESECTION: SHX6839

## 2020-06-07 SURGERY — COLONOSCOPY WITH PROPOFOL
Anesthesia: General

## 2020-06-07 MED ORDER — PROPOFOL 500 MG/50ML IV EMUL
INTRAVENOUS | Status: DC | PRN
  Administered 2020-06-07: 150 ug/kg/min via INTRAVENOUS

## 2020-06-07 MED ORDER — LACTATED RINGERS IV SOLN: INTRAVENOUS | Status: DC

## 2020-06-07 MED ORDER — PROPOFOL 10 MG/ML IV BOLUS
INTRAVENOUS | Status: DC | PRN
  Administered 2020-06-07 (×2): 20 mg via INTRAVENOUS
  Administered 2020-06-07: 10 mg via INTRAVENOUS
  Administered 2020-06-07: 20 mg via INTRAVENOUS

## 2020-06-07 MED ORDER — SODIUM CHLORIDE 0.9 % IV SOLN: INTRAVENOUS | Status: DC

## 2020-06-07 MED ORDER — GLUCAGON HCL RDNA (DIAGNOSTIC) 1 MG IJ SOLR
INTRAMUSCULAR | Status: AC
  Filled 2020-06-07: qty 1

## 2020-06-07 MED ORDER — GLUCAGON HCL RDNA (DIAGNOSTIC) 1 MG IJ SOLR
INTRAMUSCULAR | Status: DC | PRN
  Administered 2020-06-07 (×2): .25 mg via INTRAVENOUS

## 2020-06-07 MED ORDER — LIDOCAINE 2% (20 MG/ML) 5 ML SYRINGE
INTRAMUSCULAR | Status: DC | PRN
  Administered 2020-06-07: 40 mg via INTRAVENOUS

## 2020-06-07 MED ORDER — HYDRALAZINE HCL 20 MG/ML IJ SOLN
INTRAMUSCULAR | Status: DC | PRN
  Administered 2020-06-07: 5 mg via INTRAVENOUS

## 2020-06-07 SURGICAL SUPPLY — 21 items

## 2020-06-07 NOTE — Anesthesia Preprocedure Evaluation (Signed)
Anesthesia Evaluation  Patient identified by MRN, date of birth, ID band Patient awake    Reviewed: Allergy & Precautions, NPO status , Patient's Chart, lab work & pertinent test results  History of Anesthesia Complications Negative for: history of anesthetic complications  Airway Mallampati: II  TM Distance: >3 FB Neck ROM: Full    Dental no notable dental hx.    Pulmonary neg pulmonary ROS, neg sleep apnea, neg COPD, Not current smoker,    Pulmonary exam normal breath sounds clear to auscultation       Cardiovascular (-) hypertension+ CAD  (-) Past MI and (-) CHF Normal cardiovascular exam(-) dysrhythmias (-) Valvular Problems/Murmurs Rhythm:Regular Rate:Normal     Neuro/Psych neg Seizures negative neurological ROS  negative psych ROS   GI/Hepatic Neg liver ROS, GERD  Medicated and Controlled,  Endo/Other  neg diabetesHypothyroidism   Renal/GU negative Renal ROS  negative genitourinary   Musculoskeletal  (+) Arthritis , Osteoarthritis,    Abdominal Normal abdominal exam  (+)   Peds negative pediatric ROS (+)  Hematology negative hematology ROS (+)   Anesthesia Other Findings   Reproductive/Obstetrics                             Anesthesia Physical  Anesthesia Plan  ASA: II  Anesthesia Plan: General   Post-op Pain Management:    Induction: Intravenous  PONV Risk Score and Plan: 2 and Propofol infusion and TIVA  Airway Management Planned: Nasal Cannula  Additional Equipment:   Intra-op Plan:   Post-operative Plan:   Informed Consent: I have reviewed the patients History and Physical, chart, labs and discussed the procedure including the risks, benefits and alternatives for the proposed anesthesia with the patient or authorized representative who has indicated his/her understanding and acceptance.     Dental advisory given  Plan Discussed with: CRNA and  Anesthesiologist  Anesthesia Plan Comments:         Anesthesia Quick Evaluation

## 2020-06-07 NOTE — Op Note (Addendum)
[facility] Patient Name: Caleb Smith Procedure Date : 06/07/2020 MRN: [id number] Attending MD: Justice Britain , MD Date of Birth: [birth year] CSN: [id number] Age: 69 Admit Type: Outpatient Procedure:                Colonoscopy Indications:              Excision of colonic polyp (noted in 8/21 referred                            out to [facility] never contacted now here for first                            resection attempt) Providers:                Justice Britain, MD, Kary Kos RN, RN,                            Lesia Sago, Technician, Elspeth Cho Tech.,                            Technician, Reather Laurence, CRNA Referring MD:             Lennette Bihari. Bonna Gains MD, MD, Ria Bush Medicines:                Monitored Anesthesia Care Complications:            No immediate complications. Estimated Blood Loss:     Estimated blood loss was minimal. Procedure:                Pre-Anesthesia Assessment:                           - Prior to the procedure, a History and Physical                            was performed, and patient medications and                            allergies were reviewed. The patient's tolerance of                            previous anesthesia was also reviewed. The risks                            and benefits of the procedure and the sedation                            options and risks were discussed with the patient.                            All questions were answered, and informed consent                            was obtained. Prior Anticoagulants: The patient has  taken no previous anticoagulant or antiplatelet                            agents except for aspirin. ASA Grade Assessment: II                            - A patient with mild systemic disease. After                            reviewing the risks and benefits, the patient was                            deemed in satisfactory condition  to undergo the                            procedure.                           After obtaining informed consent, the colonoscope                            was passed under direct vision. Throughout the                            procedure, the patient's blood pressure, pulse, and                            oxygen saturations were monitored continuously. The                            CF-HQ190L ([id number]) Olympus colonoscope was                            introduced through the anus and advanced to the 5                            cm into the ileum. The colonoscopy was performed                            without difficulty. The patient tolerated the                            procedure. The quality of the bowel preparation was                            adequate. The terminal ileum, ileocecal valve,                            appendiceal orifice, and rectum were photographed. Scope In: 10:26:22 AM Scope Out: 11:58:07 AM Scope Withdrawal Time: 1 hour 28 minutes 15 seconds  Total Procedure Duration: 1 hour 31 minutes 45 seconds  Findings:      The digital rectal exam findings include hemorrhoids. Pertinent       negatives include no palpable rectal lesions.  The terminal ileum and ileocecal valve appeared normal.      A 10 mm polyp was found in the distal transverse/proximal descending       colon. The polyp was sessile. The polyp was removed with a cold snare.       Resection and retrieval were complete.      A tattoo was seen in the proximal descending colon and in the distal       transverse colon and it was noted to be near and underneath the noted       polyp below.      A greater than 40 mm polyp was found in the distal transverse/proximal       descending colon (~50 cm from anal canal) overlying 2 haustral folds       taking up nearly 50% of the circumference of one of the folds. The polyp       was sessile/semi-sessile and mixed lateral spreading. There was one       region  of the lateral edge of the polyp that had JNET2a and JNET2b, with       a small area nearing JNET3 classification. Preparations were made for       mucosal resection. NBI imaging and White-light endoscopy was done to       demarcate the borders of the lesion. Orise gel was injected to raise the       lesion (15 mL utilized) with good effect for the majority of the lesion.       Piecemeal mucosal resection using a snare as well as avulsion was       performed. Resection and retrieval were felt to be complete. It would       not be possible to close this defect entirely potentially 50 mm in size       and nearly 60% of the circumference of this wall region. Thus to prevent       bleeding after mucosal resection, four hemostatic clips were       successfully placed (MR conditional) in small areas of continued oozing.       There was no bleeding at the end of the procedure.      Normal mucosa was found in the entire colon otherwise.      Non-bleeding non-thrombosed external and internal hemorrhoids were found       during retroflexion, during perianal exam and during digital exam. The       hemorrhoids were Grade II (internal hemorrhoids that prolapse but reduce       spontaneously). Impression:               - Hemorrhoids found on digital rectal exam.                           - The examined portion of the ileum was normal.                           - One 10 mm polyp in the distal transverse/ colon,                            removed with a cold snare. Resected and retrieved.                           - A tattoo  was seen in the distal                            transverse/proximal descending colon and it was                            found underneath the following polyp.                           - One 40 mm mixed sessile/semi-sessile polyp, in                            the distal transverse/proximal descending colon,                            over 2 hautral folds (mostly JNET2a & JNET2b  with                            one area of possible JNET3), removed with piecemeal                            mucosal resection and avulsion. Resected and                            retrieved. Clips (MR conditional) were placed on 4                            areas but complete closure not possible.                           - Normal mucosa in the entire examined colon                            otherwise.                           - Non-bleeding non-thrombosed external and internal                            hemorrhoids. Recommendation:           - The patient will be observed post-procedure,                            until all discharge criteria are met.                           - Discharge patient to home.                           - Patient has a contact number available for                            emergencies. The signs and symptoms of potential  delayed complications were discussed with the                            patient. Return to normal activities tomorrow.                            Written discharge instructions were provided to the                            patient.                           - High fiber diet.                           - Use FiberCon 1-2 tablets PO daily.                           - Continue present medications.                           - Await pathology results.                           - Hold Aspirin for 1-week.                           - Minimize NSAID use for next 2-3 weeks.                           - Repeat colonoscopy in 6-9 months for surveillance                            based on pathology results (as long as no evidence                            of malignancy).                           - I will be considering a CT-Abdomen/Pelvis and                            possible CEA level for patient even if tissue                            returns non-malignant to ensure nothing else is                             found but will see pathology first.                           - The findings and recommendations were discussed                            with the patient.                           -  The findings and recommendations were discussed                            with the patient's family. Procedure Code(s):        --- Professional ---                           [phone], Colonoscopy, flexible; with endoscopic                            mucosal resection                           45385, 39, Colonoscopy, flexible; with removal of                            tumor(s), polyp(s), or other lesion(s) by snare                            technique Diagnosis Code(s):        --- Professional ---                           K64.1, Second degree hemorrhoids                           K63.5, Polyp of colon CPT copyright 2019 American Medical Association. All rights reserved. The codes documented in this report are preliminary and upon coder review may  be revised to meet current compliance requirements. Justice Britain, MD 06/07/2020 12:18:01 PM Number of Addenda: 0

## 2020-06-07 NOTE — Transfer of Care (Signed)
Immediate Anesthesia Transfer of Care Note  Patient: Caleb Smith [facility]  Procedure(s) Performed: COLONOSCOPY WITH PROPOFOL (N/A ) ENDOSCOPIC MUCOSAL RESECTION (N/A ) POLYPECTOMY SUBMUCOSAL LIFTING INJECTION HEMOSTASIS CLIP PLACEMENT  Patient Location: Endoscopy Unit  Anesthesia Type:MAC  Level of Consciousness: drowsy and patient cooperative  Airway & Oxygen Therapy: Patient Spontanous Breathing and Patient connected to nasal cannula oxygen  Post-op Assessment: Report given to RN and Post -op Vital signs reviewed and stable  Post vital signs: Reviewed  Last Vitals:  Vitals Value Taken Time  BP 126/61 06/07/20 1209  Temp    Pulse 66 06/07/20 1210  Resp 15 06/07/20 1210  SpO2 96 % 06/07/20 1210  Vitals shown include unvalidated device data.  Last Pain:  Vitals:   06/07/20 0958  TempSrc: Oral  PainSc: 0-No pain         Complications: No complications documented.

## 2020-06-07 NOTE — Anesthesia Procedure Notes (Signed)
Procedure Name: MAC Date/Time: 06/07/2020 10:18 AM Performed by: Janene Harvey, CRNA Pre-anesthesia Checklist: Patient identified, Emergency Drugs available, Suction available and Patient being monitored Patient Re-evaluated:Patient Re-evaluated prior to induction Oxygen Delivery Method: Nasal cannula Induction Type: IV induction Placement Confirmation: positive ETCO2 Dental Injury: Teeth and Oropharynx as per pre-operative assessment

## 2020-06-07 NOTE — Interval H&P Note (Signed)
History and Physical Interval Note:  06/07/2020 10:06 AM  Caleb Smith  has presented today for surgery, with the diagnosis of colon polyp.  The various methods of treatment have been discussed with the patient and family. After consideration of risks, benefits and other options for treatment, the patient has consented to  Procedure(s): COLONOSCOPY WITH PROPOFOL (N/A) ENDOSCOPIC MUCOSAL RESECTION (N/A) as a surgical intervention.  The patient's history has been reviewed, patient examined, no change in status, stable for surgery.  I have reviewed the patient's chart and labs.  Questions were answered to the patient's satisfaction.     [employer]

## 2020-06-07 NOTE — Discharge Instructions (Addendum)
YOU HAD AN ENDOSCOPIC PROCEDURE TODAY: Refer to the procedure report and other information in the discharge instructions given to you for any specific questions about what was found during the examination. If this information does not answer your questions, please call [place] office at [phone] to clarify.   YOU SHOULD EXPECT: Some feelings of bloating in the abdomen. Passage of more gas than usual. Walking can help get rid of the air that was put into your GI tract during the procedure and reduce the bloating. If you had a lower endoscopy (such as a colonoscopy or flexible sigmoidoscopy) you may notice spotting of blood in your stool or on the toilet paper. Some abdominal soreness may be present for a day or two, also.  DIET: Your first meal following the procedure should be a light meal and then it is ok to progress to your normal diet. A half-sandwich or bowl of soup is an example of a good first meal. Heavy or fried foods are harder to digest and may make you feel nauseous or bloated. Drink plenty of fluids but you should avoid alcoholic beverages for 24 hours. If you had a esophageal dilation, please see attached instructions for diet.    Monitored Anesthesia Care Anesthesia is a term that refers to techniques, procedures, and medicines that help a person stay safe and comfortable during a medical procedure. Monitored anesthesia care, or sedation, is one type of anesthesia. Your anesthesia specialist may recommend sedation if you will be having a procedure that does not require you to be unconscious, such as:  Cataract surgery.  A dental procedure.  A biopsy.  A colonoscopy. During the procedure, you may receive a medicine to help you relax (sedative). There are three levels of sedation:  Mild sedation. At this level, you may feel awake and relaxed. You will be able to follow directions.  Moderate sedation. At this level, you will be sleepy. You may not remember the  procedure.  Deep sedation. At this level, you will be asleep. You will not remember the procedure. The more medicine you are given, the deeper your level of sedation will be. Depending on how you respond to the procedure, the anesthesia specialist may change your level of sedation or the type of anesthesia to fit your needs. An anesthesia specialist will monitor you closely during the procedure. Let your health care provider know about:  Any allergies you have.  All medicines you are taking, including vitamins, herbs, eye drops, creams, and over-the-counter medicines.  Any use of steroids (by mouth or as a cream).  Any problems you or family members have had with sedatives and anesthetic medicines.  Any blood disorders you have.  Any surgeries you have had.  Any medical conditions you have, such as sleep apnea.  Whether you are pregnant or may be pregnant.  Any use of cigarettes, alcohol, or street drugs. What are the risks? Generally, this is a safe procedure. However, problems may occur, including:  Getting too much medicine (oversedation).  Nausea.  Allergic reaction to medicines.  Trouble breathing. If this happens, a breathing tube may be used to help with breathing. It will be removed when you are awake and breathing on your own.  Heart trouble.  Lung trouble. Before the procedure Staying hydrated Follow instructions from your health care provider about hydration, which may include:  Up to 2 hours before the procedure - you may continue to drink clear liquids, such as water, clear fruit juice, black coffee,  and plain tea. Eating and drinking restrictions Follow instructions from your health care provider about eating and drinking, which may include:  8 hours before the procedure - stop eating heavy meals or foods such as meat, fried foods, or fatty foods.  6 hours before the procedure - stop eating light meals or foods, such as toast or cereal.  6 hours before  the procedure - stop drinking milk or drinks that contain milk.  2 hours before the procedure - stop drinking clear liquids. Medicines Ask your health care provider about:  Changing or stopping your regular medicines. This is especially important if you are taking diabetes medicines or blood thinners.  Taking medicines such as aspirin and ibuprofen. These medicines can thin your blood. Do not take these medicines before your procedure if your health care provider instructs you not to. Tests and exams  You will have a physical exam.  You may have blood tests done to show: ? How well your kidneys and liver are working. ? How well your blood can clot. General instructions  Plan to have someone take you home from the hospital or clinic.  If you will be going home right after the procedure, plan to have someone with you for 24 hours.  What happens during the procedure?  Your blood pressure, heart rate, breathing, level of pain and overall condition will be monitored.  An IV tube will be inserted into one of your veins.  Your anesthesia specialist will give you medicines as needed to keep you comfortable during the procedure. This may mean changing the level of sedation.  The procedure will be performed. After the procedure  Your blood pressure, heart rate, breathing rate, and blood oxygen level will be monitored until the medicines you were given have worn off.  Do not drive for 24 hours if you received a sedative.  You may: ? Feel sleepy, clumsy, or nauseous. ? Feel forgetful about what happened after the procedure. ? Have a sore throat if you had a breathing tube during the procedure. ? Vomit. This information is not intended to replace advice given to you by your health care provider. Make sure you discuss any questions you have with your health care provider. Document Revised: 05/29/2017 Document Reviewed: 10/07/2015 Elsevier Patient Education  [place].     ACTIVITY: Your care partner should take you home directly after the procedure. You should plan to take it easy, moving slowly for the rest of the day. You can resume normal activity the day after the procedure however YOU SHOULD NOT DRIVE, use power tools, machinery or perform tasks that involve climbing or major physical exertion for 24 hours (because of the sedation medicines used during the test).   SYMPTOMS TO REPORT IMMEDIATELY: A gastroenterologist can be reached at any hour. Please call [phone]  for any of the following symptoms:   Following lower endoscopy (colonoscopy, flexible sigmoidoscopy) Excessive amounts of blood in the stool  Significant tenderness, worsening of abdominal pains  Swelling of the abdomen that is new, acute  Fever of 100 or higher   Following upper endoscopy (EGD, EUS, ERCP, esophageal dilation) Vomiting of blood or coffee ground material  New, significant abdominal pain  New, significant chest pain or pain under the shoulder blades  Painful or persistently difficult swallowing  New shortness of breath  Black, tarry-looking or red, bloody stools  FOLLOW UP:  If any biopsies were taken you will be contacted by phone or by letter within the next  1-3 weeks. Call [phone]  if you have not heard about the biopsies in 3 weeks.  Please also call with any specific questions about appointments or follow up tests.

## 2020-06-07 NOTE — Anesthesia Postprocedure Evaluation (Signed)
Anesthesia Post Note  Patient: Caleb Smith  Procedure(s) Performed: COLONOSCOPY WITH PROPOFOL (N/A ) ENDOSCOPIC MUCOSAL RESECTION (N/A ) POLYPECTOMY SUBMUCOSAL LIFTING INJECTION HEMOSTASIS CLIP PLACEMENT     Patient location during evaluation: PACU Anesthesia Type: MAC Level of consciousness: awake and alert Pain management: pain level controlled Vital Signs Assessment: post-procedure vital signs reviewed and stable Respiratory status: spontaneous breathing and respiratory function stable Cardiovascular status: stable Postop Assessment: no apparent nausea or vomiting Anesthetic complications: no   No complications documented.  Last Vitals:  Vitals:   06/07/20 1220 06/07/20 1230  BP: 128/80 129/82  Pulse: 61 (!) 56  Resp: 15 18  Temp:    SpO2: 98% 98%    Last Pain:  Vitals:   06/07/20 1220  TempSrc:   PainSc: 0-No pain                 Merlinda Frederick

## 2020-06-08 NOTE — Progress Notes (Signed)
NO ANSWER, NO ANSWERING MACHINE

## 2020-06-10 ENCOUNTER — Encounter (HOSPITAL_COMMUNITY): Payer: Self-pay | Admitting: Gastroenterology

## 2020-06-12 ENCOUNTER — Other Ambulatory Visit (INDEPENDENT_AMBULATORY_CARE_PROVIDER_SITE_OTHER): Payer: BC Managed Care – PPO

## 2020-06-12 ENCOUNTER — Other Ambulatory Visit: Payer: Self-pay

## 2020-06-12 DIAGNOSIS — D124 Benign neoplasm of descending colon: Secondary | ICD-10-CM

## 2020-06-12 DIAGNOSIS — C189 Malignant neoplasm of colon, unspecified: Secondary | ICD-10-CM

## 2020-06-12 DIAGNOSIS — C801 Malignant (primary) neoplasm, unspecified: Secondary | ICD-10-CM

## 2020-06-12 LAB — CBC WITH DIFFERENTIAL/PLATELET
Basophils Absolute: 0.1 10*3/uL (ref 0.0–0.1)
Basophils Relative: 0.8 % (ref 0.0–3.0)
Eosinophils Absolute: 0.1 10*3/uL (ref 0.0–0.7)
Eosinophils Relative: 1.8 % (ref 0.0–5.0)
HCT: 44.9 % (ref 39.0–52.0)
Hemoglobin: 15.2 g/dL (ref 13.0–17.0)
Lymphocytes Relative: 23.9 % (ref 12.0–46.0)
Lymphs Abs: 1.7 10*3/uL (ref 0.7–4.0)
MCHC: 33.8 g/dL (ref 30.0–36.0)
MCV: 93.5 fl (ref 78.0–100.0)
Monocytes Absolute: 0.8 10*3/uL (ref 0.1–1.0)
Monocytes Relative: 11.5 % (ref 3.0–12.0)
Neutro Abs: 4.5 10*3/uL (ref 1.4–7.7)
Neutrophils Relative %: 62 % (ref 43.0–77.0)
Platelets: 279 10*3/uL (ref 150.0–400.0)
RBC: 4.8 Mil/uL (ref 4.22–5.81)
RDW: 14.1 % (ref 11.5–15.5)
WBC: 7.3 10*3/uL (ref 4.0–10.5)

## 2020-06-12 LAB — HEPATIC FUNCTION PANEL
ALT: 31 U/L (ref 0–53)
AST: 17 U/L (ref 0–37)
Albumin: 4.5 g/dL (ref 3.5–5.2)
Alkaline Phosphatase: 58 U/L (ref 39–117)
Bilirubin, Direct: 0.1 mg/dL (ref 0.0–0.3)
Total Bilirubin: 0.4 mg/dL (ref 0.2–1.2)
Total Protein: 7.5 g/dL (ref 6.0–8.3)

## 2020-06-12 LAB — SURGICAL PATHOLOGY

## 2020-06-12 LAB — CEA: CEA: 0.9 ng/mL

## 2020-06-13 ENCOUNTER — Encounter: Payer: Self-pay | Admitting: Gastroenterology

## 2020-06-19 ENCOUNTER — Ambulatory Visit (HOSPITAL_BASED_OUTPATIENT_CLINIC_OR_DEPARTMENT_OTHER)
Admission: RE | Admit: 2020-06-19 | Discharge: 2020-06-19 | Disposition: A | Discharge status: Home / Self Care | Payer: BC Managed Care – PPO | Source: Ambulatory Visit | Attending: Gastroenterology | Admitting: Gastroenterology

## 2020-06-19 ENCOUNTER — Other Ambulatory Visit: Payer: Self-pay

## 2020-06-19 ENCOUNTER — Telehealth: Payer: Self-pay | Admitting: Gastroenterology

## 2020-06-19 DIAGNOSIS — C801 Malignant (primary) neoplasm, unspecified: Secondary | ICD-10-CM

## 2020-06-19 DIAGNOSIS — C189 Malignant neoplasm of colon, unspecified: Secondary | ICD-10-CM | POA: Diagnosis not present

## 2020-06-19 DIAGNOSIS — C20 Malignant neoplasm of rectum: Secondary | ICD-10-CM | POA: Diagnosis not present

## 2020-06-19 DIAGNOSIS — D124 Benign neoplasm of descending colon: Secondary | ICD-10-CM | POA: Diagnosis not present

## 2020-06-19 MED ORDER — IOHEXOL 300 MG/ML  SOLN
100.0000 mL | Freq: Once | INTRAMUSCULAR | Status: AC | PRN
  Administered 2020-06-19: 100 mL via INTRAVENOUS

## 2020-06-19 NOTE — Telephone Encounter (Signed)
Saleste called from [facility] [facility] regarding missing lab results for test to be completed please call her [phone]

## 2020-06-19 NOTE — Telephone Encounter (Signed)
Spoke with [facility] and I stat entered.  She states that [facility] does not have an I Stat machine.  Celste is going to see if the pt wants to go to [place] for the I stat and come back to [facility] for the CT.  She will call me back if needed.

## 2020-06-21 ENCOUNTER — Encounter: Payer: Self-pay | Admitting: Family Medicine

## 2020-06-21 DIAGNOSIS — C184 Malignant neoplasm of transverse colon: Secondary | ICD-10-CM | POA: Insufficient documentation

## 2020-07-03 DIAGNOSIS — C189 Malignant neoplasm of colon, unspecified: Secondary | ICD-10-CM | POA: Diagnosis not present

## 2020-07-13 DIAGNOSIS — Z23 Encounter for immunization: Secondary | ICD-10-CM | POA: Diagnosis not present

## 2020-07-23 NOTE — Progress Notes (Addendum)
COVID Vaccine Completed:  x2 Date COVID Vaccine completed:  10-09-19.  Booster 07/13/20 COVID vaccine manufacturer: [employer] Booster   PCP - Ria Bush, MD Cardiologist - Fransico Him, MD.  Last OV 02-10-19 in Epic  Chest x-ray - CT chest 06-19-20 in Epic EKG - 07-26-20 in Epic Stress Test - 08-28-17 in Epic ECHO -  Cardiac Cath -  Pacemaker/ICD device last checked:  Sleep Study - N/A CPAP -   Fasting Blood Sugar - N/A Checks Blood Sugar _____ times a day  Blood Thinner Instructions: Aspirin Instructions:  ASA 81 mg Last Dose:  07-26-20  Anesthesia review: Coronary artery calcifications. Evaluated by cardiology  Patient denies shortness of breath, fever, cough and chest pain at PAT appointment. Pt able to climb a flight of stairs and do housework.   Patient verbalized understanding of instructions that were given to them at the PAT appointment. Patient was also instructed that they will need to review over the PAT instructions again at home before surgery.

## 2020-07-23 NOTE — Patient Instructions (Addendum)
DUE TO COVID-19 ONLY ONE VISITOR IS ALLOWED TO COME WITH YOU AND STAY IN THE WAITING ROOM ONLY DURING PRE OP AND PROCEDURE.   IF YOU WILL BE ADMITTED INTO THE HOSPITAL YOU ARE ALLOWED ONE SUPPORT PERSON DURING VISITATION HOURS ONLY (10AM -8PM)   . The support person may change daily. . The support person must pass our screening, gel in and out, and wear a mask at all times, including in the patient's room. . Patients must also wear a mask when staff or their support person are in the room.   COVID SWAB TESTING MUST BE COMPLETED ON:  Monday, 07-30-20 @ 8:35 AM   [address] [place], [place] [address]  (Must self quarantine after testing. Follow instructions on handout.)        Your procedure is scheduled on:   Thursday, 08-02-20   Report to [facility] Main  Entrance    Report to admitting at 7:30 AM   Call this number if you have problems the morning of surgery [phone]    Follow bowel prep from surgeon's office   Do not eat food :After Midnight.   May have liquids until 6:30 AM day of surgery  CLEAR LIQUID DIET  Foods Allowed                                                                     Foods Excluded  Water, Black Coffee and tea, regular and decaf              liquids that you cannot  Plain Jell-O in any flavor  (No red)                                    see through such as: Fruit ices (not with fruit pulp)                                      milk, soups, orange juice              Iced Popsicles (No red)                                      All solid food                                   Apple juices Sports drinks like Gatorade (No red) Lightly seasoned clear broth or consume(fat free) Sugar, honey syrup  Sample Menu Breakfast                                Lunch                                     Supper Cranberry juice  Beef broth                            Chicken broth Jell-O                                     Grape juice                            Apple juice Coffee or tea                        Jell-O                                      Popsicle                                                Coffee or tea                        Coffee or tea      Drink 2 Ensure drinks the night before surgery by 10:00 PM.     Complete one Ensure drink the morning of surgery 3 hours prior to scheduled surgery at 6:30 AM.     1. The day of surgery:  ? Drink ONE (1) Pre-Surgery Clear Ensure or G2 by am the morning of surgery. Drink in one sitting. Do not sip.  ? This drink was given to you during your hospital  pre-op appointment visit. ? Nothing else to drink after completing the  Pre-Surgery Clear Ensure or G2.          If you have questions, please contact your surgeon's office.     Oral Hygiene is also important to reduce your risk of infection.                                    Remember - BRUSH YOUR TEETH THE MORNING OF SURGERY WITH YOUR REGULAR TOOTHPASTE   Do NOT smoke after Midnight   Take these medicines the morning of surgery with A SIP OF WATER: Atorvastatin, Ezetimibe, Omeprazole, Synthroid                                You may not have any metal on your body including jewelry, and body piercings             Do not wear lotions, powders, perfumes/cologne, or deodorant             Men may shave face and neck.   Do not bring valuables to the hospital. [place].   Contacts, dentures or bridgework may not be worn into surgery.   Bring small overnight bag day of surgery.                 Please read over the following fact sheets you were given: IF YOU HAVE QUESTIONS ABOUT YOUR PRE OP  INSTRUCTIONS PLEASE CALL [facility] - Preparing for Surgery Before surgery, you can play an important role.  Because skin is not sterile, your skin needs to be as free of germs as possible.  You can reduce the number of germs on your skin by washing with CHG  (chlorahexidine gluconate) soap before surgery.  CHG is an antiseptic cleaner which kills germs and bonds with the skin to continue killing germs even after washing. Please DO NOT use if you have an allergy to CHG or antibacterial soaps.  If your skin becomes reddened/irritated stop using the CHG and inform your nurse when you arrive at Short Stay. Do not shave (including legs and underarms) for at least 48 hours prior to the first CHG shower.  You may shave your face/neck.  Please follow these instructions carefully:  1.  Shower with CHG Soap the night before surgery and the  morning of surgery.  2.  If you choose to wash your hair, wash your hair first as usual with your normal  shampoo.  3.  After you shampoo, rinse your hair and body thoroughly to remove the shampoo.                             4.  Use CHG as you would any other liquid soap.  You can apply chg directly to the skin and wash.  Gently with a scrungie or clean washcloth.  5.  Apply the CHG Soap to your body ONLY FROM THE NECK DOWN.   Do   not use on face/ open                           Wound or open sores. Avoid contact with eyes, ears mouth and   genitals (private parts).                       Wash face,  Genitals (private parts) with your normal soap.             6.  Wash thoroughly, paying special attention to the area where your    surgery  will be performed.  7.  Thoroughly rinse your body with warm water from the neck down.  8.  DO NOT shower/wash with your normal soap after using and rinsing off the CHG Soap.                9.  Pat yourself dry with a clean towel.            10.  Wear clean pajamas.            11.  Place clean sheets on your bed the night of your first shower and do not  sleep with pets. Day of Surgery : Do not apply any lotions/deodorants the morning of surgery.  Please wear clean clothes to the hospital/surgery center.  FAILURE TO FOLLOW THESE INSTRUCTIONS MAY RESULT IN THE CANCELLATION OF YOUR  SURGERY  PATIENT SIGNATURE_________________________________  NURSE SIGNATURE__________________________________  ________________________________________________________________________    Caleb Smith  An incentive spirometer is a tool that can help keep your lungs clear and active. This tool measures how well you are filling your lungs with each breath. Taking long deep breaths may help reverse or decrease the chance of developing breathing (pulmonary) problems (especially infection) following:  A long period of time when you are unable to  move or be active. BEFORE THE PROCEDURE   If the spirometer includes an indicator to show your best effort, your nurse or respiratory therapist will set it to a desired goal.  If possible, sit up straight or lean slightly forward. Try not to slouch.  Hold the incentive spirometer in an upright position. INSTRUCTIONS FOR USE  1. Sit on the edge of your bed if possible, or sit up as far as you can in bed or on a chair. 2. Hold the incentive spirometer in an upright position. 3. Breathe out normally. 4. Place the mouthpiece in your mouth and seal your lips tightly around it. 5. Breathe in slowly and as deeply as possible, raising the piston or the ball toward the top of the column. 6. Hold your breath for 3-5 seconds or for as long as possible. Allow the piston or ball to fall to the bottom of the column. 7. Remove the mouthpiece from your mouth and breathe out normally. 8. Rest for a few seconds and repeat Steps 1 through 7 at least 10 times every 1-2 hours when you are awake. Take your time and take a few normal breaths between deep breaths. 9. The spirometer may include an indicator to show your best effort. Use the indicator as a goal to work toward during each repetition. 10. After each set of 10 deep breaths, practice coughing to be sure your lungs are clear. If you have an incision (the cut made at the time of surgery), support your  incision when coughing by placing a pillow or rolled up towels firmly against it. Once you are able to get out of bed, walk around indoors and cough well. You may stop using the incentive spirometer when instructed by your caregiver.  RISKS AND COMPLICATIONS  Take your time so you do not get dizzy or light-headed.  If you are in pain, you may need to take or ask for pain medication before doing incentive spirometry. It is harder to take a deep breath if you are having pain. AFTER USE  Rest and breathe slowly and easily.  It can be helpful to keep track of a log of your progress. Your caregiver can provide you with a simple table to help with this. If you are using the spirometer at home, follow these instructions: [place] IF:   You are having difficultly using the spirometer.  You have trouble using the spirometer as often as instructed.  Your pain medication is not giving enough relief while using the spirometer.  You develop fever of 100.5 F (38.1 C) or higher. SEEK IMMEDIATE MEDICAL CARE IF:   You cough up bloody sputum that had not been present before.  You develop fever of 102 F (38.9 C) or greater.  You develop worsening pain at or near the incision site. MAKE SURE YOU:   Understand these instructions.  Will watch your condition.  Will get help right away if you are not doing well or get worse. Document Released: 10/27/2006 Document Revised: 09/08/2011 Document Reviewed: 12/28/2006 ExitCare Patient Information 2014 Exit[employer], [place].   ________________________________________________________________________      WHAT IS A BLOOD TRANSFUSION? Blood Transfusion Information  A transfusion is the replacement of blood or some of its parts. Blood is made up of multiple cells which provide different functions.  Red blood cells carry oxygen and are used for blood loss replacement.  White blood cells fight against infection.  Platelets control  bleeding.  Plasma helps clot blood.  Other  blood products are available for specialized needs, such as hemophilia or other clotting disorders. BEFORE THE TRANSFUSION  Who gives blood for transfusions?   Healthy volunteers who are fully evaluated to make sure their blood is safe. This is blood bank blood. Transfusion therapy is the safest it has ever been in the practice of medicine. Before blood is taken from a donor, a complete history is taken to make sure that person has no history of diseases nor engages in risky social behavior (examples are intravenous drug use or sexual activity with multiple partners). The donor's travel history is screened to minimize risk of transmitting infections, such as malaria. The donated blood is tested for signs of infectious diseases, such as HIV and hepatitis. The blood is then tested to be sure it is compatible with you in order to minimize the chance of a transfusion reaction. If you or a relative donates blood, this is often done in anticipation of surgery and is not appropriate for emergency situations. It takes many days to process the donated blood. RISKS AND COMPLICATIONS Although transfusion therapy is very safe and saves many lives, the main dangers of transfusion include:   Getting an infectious disease.  Developing a transfusion reaction. This is an allergic reaction to something in the blood you were given. Every precaution is taken to prevent this. The decision to have a blood transfusion has been considered carefully by your caregiver before blood is given. Blood is not given unless the benefits outweigh the risks. AFTER THE TRANSFUSION  Right after receiving a blood transfusion, you will usually feel much better and more energetic. This is especially true if your red blood cells have gotten low (anemic). The transfusion raises the level of the red blood cells which carry oxygen, and this usually causes an energy increase.  The nurse  administering the transfusion will monitor you carefully for complications. HOME CARE INSTRUCTIONS  No special instructions are needed after a transfusion. You may find your energy is better. Speak with your caregiver about any limitations on activity for underlying diseases you may have. SEEK MEDICAL CARE IF:   Your condition is not improving after your transfusion.  You develop redness or irritation at the intravenous (IV) site. SEEK IMMEDIATE MEDICAL CARE IF:  Any of the following symptoms occur over the next 12 hours:  Shaking chills.  You have a temperature by mouth above 102 F (38.9 C), not controlled by medicine.  Chest, back, or muscle pain.  People around you feel you are not acting correctly or are confused.  Shortness of breath or difficulty breathing.  Dizziness and fainting.  You get a rash or develop hives.  You have a decrease in urine output.  Your urine turns a dark color or changes to pink, red, or brown. Any of the following symptoms occur over the next 10 days:  You have a temperature by mouth above 102 F (38.9 C), not controlled by medicine.  Shortness of breath.  Weakness after normal activity.  The white part of the eye turns yellow (jaundice).  You have a decrease in the amount of urine or are urinating less often.  Your urine turns a dark color or changes to pink, red, or brown. Document Released: 06/13/2000 Document Revised: 09/08/2011 Document Reviewed: 01/31/2008 [facility] Patient Information 2014 [place], [place].  _______________________________________________________________________

## 2020-07-25 ENCOUNTER — Ambulatory Visit: Payer: Self-pay | Admitting: Surgery

## 2020-07-25 NOTE — H&P (Signed)
CC: Referred for newly diagnosed colon cancer presumably around splenic flexure  HPI: Mr. 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 He underwent a follow-up colonoscopy with Dr. Rush Landmark 06/07/20 which demonstrated hemorrhoids, one similar polyp in the distal transverse colon removed. She was seen in the distal transverse/proximal descending colon. A 4 cm Sessile/semi-sessile polyp at the distal transverse/proximal descending colon over to haustral folds that was removed piecemeal. Clips were placed. Complete closure is not possible. Pathology returned to her adenoma of the smaller polyp. No high-grade dysplasia. The larger piecemeal polypectomy returned with foci of invasive adenocarcinoma arising in an adenomatous polyp with high-grade dysplasia. He underwent staging CT chest/tendon/pelvis which demonstrated no evidence of metastatic disease. These were completed 06/19/20.  He previously had a colonoscopy completed 02/14/20 which showed flat polyps in the transverse colon, ascending colon, cecum. They were removed. A larger polyp was found 45 centers proximally as it was labeled with tattoo. This was not removed. He was referred by Dr. Laureen Abrahams to [facility] for consideration of EMR. In not attending this referral but did see Dr. Rush Landmark in follow-up in December. Pathology from his August colonoscopy showed fragments of sessile serrated polyps.  We spent time personally reviewing his CT scan. There are no clearly identified clips present in the lumen of the colon. There is a segment when viewed with a critical lie in the distal transverse colon that does look somewhat featureless and may correspond to the area of his polypectomy. Fortunately the area of concern was tattooed.  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 He denies  any prior abdominal surgical history  FHx: Denies FHx of colorectal, breast, endometrial, ovarian or cervical cancer  Social: Denies use of tobacco/drugs; reports he drinks 2 beers per day. Denies any withdrawal type symptoms if he goes for days without a drink. He works for [employer]. He is here today with his daughter whom is a Armed forces technical officer with [employer].  ROS: A comprehensive 10 system review of systems was completed with the patient and pertinent findings as noted above.  The patient is a 70 year old male.   Allergies Emeline Gins, [place]; 07/03/2020 10:10 AM) No Known Drug Allergies  [06/27/2020]: Allergies Reconciled   Medication History Emeline Gins, CMA; 07/03/2020 10:10 AM) Atorvastatin Calcium (80MG  Tablet, Oral) Active. Ezetimibe (10MG  Tablet, Oral) Active. Omeprazole (40MG  Capsule DR, Oral) Active. Synthroid (100MCG Tablet, Oral) Active. Medications Reconciled    Review of Systems Harrell Gave M. Tywanna Seifer MD; 07/03/2020 10:42 AM) General Not Present- Chills, Dietary Changes, Fever and Weight Loss. HEENT Not Present- Blurred Vision, Double Vision and Headache. Respiratory Not Present- Decreased Exercise Tolerance, Difficulty Breathing and Difficulty Breathing on Exertion. Cardiovascular Not Present- Chest Pain, Difficulty Breathing Lying Down, Difficulty Breathing On Exertion and Edema. Gastrointestinal Not Present- Abdominal Mass, Abdominal Pain, Bloody Stool, Nausea, Pain with Bowel Movement and Vomiting. Neurological Not Present- Decreased Memory and Difficulty Speaking. Psychiatric Not Present- Anxiety and Depression. Endocrine Not Present- Appetite Changes, Cold Intolerance and Excessive Hunger. Hematology Not Present- Abnormal Bleeding, Blood Clots and Blood Thinners.  Vitals Emeline Gins CMA; 07/03/2020 10:10 AM) 07/03/2020 10:10 AM Weight: 204.4 lb Height: 67in Body Surface Area: 2.04 m Body Mass Index: 32.01 kg/m   Temp.: 82F  Pulse: 67 (Regular)  BP: 146/84(Sitting, Left Arm, Standard)       Physical Exam Harrell Gave  Gabriel Earing MD; 07/03/2020 10:43 AM) The physical exam findings are as follows: Note: Constitutional: No acute distress; conversant; no deformities; wearing mask Eyes: Moist conjunctiva; no lid lag; anicteric sclerae; pupils equal and round Neck: Trachea midline; no palpable thyromegaly Lungs: Normal respiratory effort; no tactile fremitus CV: rrr; no palpable thrill; no pitting edema GI: Abdomen soft, nontender, nondistended; no palpable hepatosplenomegaly MSK: Normal gait; no clubbing/cyanosis Psychiatric: Appropriate affect; alert and oriented 3 Lymphatic: No palpable cervical or axillary lymphadenopathy    Assessment & Plan Harrell Gave M. Sayf Kerner MD; 07/03/2020 10:46 AM) COLON CANCER (C18.9) Story: Mr. Castagna is a very pleasant [phone] with hx of HTN, HLD, Hypothyroidism here for newly diagnosed invasive adenocarcinoma found following piecemeal resection of a polyp that was presumably at the splenic flexure. This area has been previously tattooed intensity was apparent at the time of his colonoscopy. Staging CT chest/abdomen/pelvis 06/19/20 demonstrates no evidence of metastatic disease Impression: -The anatomy and physiology of the GI tract was discussed with the patient. The pathophysiology of colon cancer was discussed at length with associated pictures. -We reviewed options going forward. We discussed a high risk of local recurrence given the piecemeal nature of this as well as inability to complete the staging including potential for lymph node involvement. We discussed surveillance as an option but that this would not be standard of care. We discussed that this would entail including frequent endoscopic exams and potentially surveillance CT scans as well. We also discussed surgery as an option inserted and the most definitive option. We discussed robotic segmental colectomy of  the involved segment. We discussed scenarios were a left hemicolectomy may be necessary. Possible takedown of splenic flexure. Possible flexible sigmoidoscopy. -The planned procedure, material risks (including, but not limited to, pain, bleeding, infection, scarring, need for blood transfusion, damage to surrounding structures- blood vessels/nerves/viscus/organs, damage to ureter, urine leak, leak from anastomosis, need for additional procedures, worsening of pre-existing medical conditions, need for stoma which may be permanent, hernia, recurrence, DVT/PE, pneumonia, heart attack, stroke, death) benefits and alternatives to surgery were discussed at length. The patient's and his daughter's questions were answered to their satisfaction, they voiced understanding and elected to proceed with surgery. Additionally, we discussed typical postoperative expectations and the recovery process.  This patient encounter took 50 minutes today to perform the following: take history, perform exam, review outside records, interpret imaging, counsel the patient on their diagnosis and document encounter, findings & plan in the EHR  Signed by Ileana Roup, MD (07/03/2020 10:46 AM)

## 2020-07-26 ENCOUNTER — Encounter (HOSPITAL_COMMUNITY): Payer: Self-pay

## 2020-07-26 ENCOUNTER — Encounter (HOSPITAL_COMMUNITY)
Admission: RE | Admit: 2020-07-26 | Discharge: 2020-07-26 | Disposition: A | Discharge status: Home / Self Care | Payer: BC Managed Care – PPO | Source: Ambulatory Visit | Attending: Surgery | Admitting: Surgery

## 2020-07-26 ENCOUNTER — Other Ambulatory Visit: Payer: Self-pay

## 2020-07-26 DIAGNOSIS — Z01818 Encounter for other preprocedural examination: Secondary | ICD-10-CM | POA: Diagnosis not present

## 2020-07-26 HISTORY — DX: Malignant neoplasm of colon, unspecified: C18.9

## 2020-07-26 LAB — PROTIME-INR
INR: 1 (ref 0.8–1.2)
Prothrombin Time: 12.5 seconds (ref 11.4–15.2)

## 2020-07-26 LAB — HEMOGLOBIN A1C
Hgb A1c MFr Bld: 6.1 % — ABNORMAL HIGH (ref 4.8–5.6)
Mean Plasma Glucose: 128.37 mg/dL

## 2020-07-26 LAB — COMPREHENSIVE METABOLIC PANEL
ALT: 39 U/L (ref 0–44)
AST: 24 U/L (ref 15–41)
Albumin: 4.2 g/dL (ref 3.5–5.0)
Alkaline Phosphatase: 55 U/L (ref 38–126)
Anion gap: 12 (ref 5–15)
BUN: 14 mg/dL (ref 8–23)
CO2: 24 mmol/L (ref 22–32)
Calcium: 9.1 mg/dL (ref 8.9–10.3)
Chloride: 105 mmol/L (ref 98–111)
Creatinine, Ser: 1.05 mg/dL (ref 0.61–1.24)
GFR, Estimated: >60 mL/min (ref >60)
Glucose, Bld: 159 mg/dL — ABNORMAL HIGH (ref 70–99)
Potassium: 3.8 mmol/L (ref 3.5–5.1)
Sodium: 141 mmol/L (ref 135–145)
Total Bilirubin: 0.7 mg/dL (ref 0.3–1.2)
Total Protein: 7.2 g/dL (ref 6.5–8.1)

## 2020-07-26 LAB — CBC WITH DIFFERENTIAL/PLATELET
Abs Immature Granulocytes: 0.01 10*3/uL (ref 0.00–0.07)
Basophils Absolute: 0 10*3/uL (ref 0.0–0.1)
Basophils Relative: 0 %
Eosinophils Absolute: 0.2 10*3/uL (ref 0.0–0.5)
Eosinophils Relative: 3 %
HCT: 46.2 % (ref 39.0–52.0)
Hemoglobin: 15 g/dL (ref 13.0–17.0)
Immature Granulocytes: 0 %
Lymphocytes Relative: 31 %
Lymphs Abs: 1.8 10*3/uL (ref 0.7–4.0)
MCH: 31.5 pg (ref 26.0–34.0)
MCHC: 32.5 g/dL (ref 30.0–36.0)
MCV: 97.1 fL (ref 80.0–100.0)
Monocytes Absolute: 0.5 10*3/uL (ref 0.1–1.0)
Monocytes Relative: 8 %
Neutro Abs: 3.2 10*3/uL (ref 1.7–7.7)
Neutrophils Relative %: 58 %
Platelets: 241 10*3/uL (ref 150–400)
RBC: 4.76 MIL/uL (ref 4.22–5.81)
RDW: 13.7 % (ref 11.5–15.5)
WBC: 5.7 10*3/uL (ref 4.0–10.5)
nRBC: 0 % (ref 0.0–0.2)

## 2020-07-26 LAB — APTT: aPTT: 29 seconds (ref 24–36)

## 2020-07-30 ENCOUNTER — Other Ambulatory Visit (HOSPITAL_COMMUNITY)
Admission: RE | Admit: 2020-07-30 | Discharge: 2020-07-30 | Disposition: A | Discharge status: Home / Self Care | Payer: BC Managed Care – PPO | Source: Ambulatory Visit | Attending: Surgery | Admitting: Surgery

## 2020-07-30 DIAGNOSIS — Z01812 Encounter for preprocedural laboratory examination: Secondary | ICD-10-CM | POA: Insufficient documentation

## 2020-07-30 DIAGNOSIS — U071 COVID-19: Secondary | ICD-10-CM | POA: Insufficient documentation

## 2020-07-30 LAB — SARS CORONAVIRUS 2 (TAT 6-24 HRS): SARS Coronavirus 2: POSITIVE — AB

## 2020-07-31 ENCOUNTER — Telehealth: Payer: Self-pay | Admitting: Nurse Practitioner

## 2020-07-31 DIAGNOSIS — U071 COVID-19: Secondary | ICD-10-CM

## 2020-07-31 HISTORY — DX: COVID-19: U07.1

## 2020-07-31 NOTE — Telephone Encounter (Signed)
Virtual Visit via Telephone Note  I connected with  Caleb Smith on @TODAY @ at  by telephone and verified that I am speaking with the correct person using two identifiers.   I discussed the limitations, risks, security and privacy concerns of performing an evaluation and management service by telephone and the availability of in person appointments. I also discussed with the patient that there may be a patient responsible charge related to this service. The patient expressed understanding and agreed to proceed.  Participating parties included in this telephone visit include: The patient and the nurse practitioner listed. The patient is: At home I am: In the office  Subjective:    CC: COVID positive  HPI: Caleb Smith is a 70 y.o. year old male presenting today via telephone visit to discuss COVID symptoms, medical history, and treatment options that may benefit him if qualifying for COVID.   Patient reports that he was tested for a procedure to be done and tested positive. He denies any symptoms at this time.    Past medical history, Surgical history, Family history not pertinant except as noted below, Social history, Allergies, and medications have been entered into the medical record, reviewed, and corrections made.   Review of Systems:  All review of systems negative except what is listed in the HPI  Objective:    General:  Patient speaking clearly in complete sentences. No shortness of breath noted.   Alert and oriented x3.   Normal judgment.  No apparent acute distress.  Impression and Recommendations:    Based on the patients current presentation, he does not qualify for the treatment options available. Discussed with patient if symptoms present to notify his PCP or seek emergency care if necessary. All questions answered.      I discussed the assessment and treatment plan with the patient. The patient was provided an opportunity to ask questions and all  were answered. The patient agreed with the plan and demonstrated an understanding of the instructions.   The patient was advised to call back or seek an in-person evaluation if the symptoms worsen or if the condition fails to improve as anticipated.  I provided 10 minutes of non-face-to-face time during this TELEPHONE encounter.    Orma Render, NP

## 2020-07-31 NOTE — Progress Notes (Signed)
Called in positive COVID results (07/30/20) to [facility] w/ Dr. Orest Dikes office-- she stated she will leave a message with Caryl Pina, the scheduler.

## 2020-08-02 LAB — TYPE AND SCREEN
ABO/RH(D): A POS
Antibody Screen: NEGATIVE

## 2020-08-27 NOTE — Patient Instructions (Signed)
DUE TO COVID-19 ONLY ONE VISITOR IS ALLOWED TO COME WITH YOU AND STAY IN THE WAITING ROOM ONLY DURING PRE OP AND PROCEDURE DAY OF SURGERY. THE 1 VISITOR  MAY VISIT WITH YOU AFTER SURGERY IN YOUR PRIVATE ROOM DURING VISITING HOURS ONLY!                Caleb Smith   Your procedure is scheduled on: 09/05/20   Report to [facility] Main  Entrance   Report to admitting at: 10:00 AM    Call this number if you have problems the morning of surgery [phone]    Remember: DRINK 2 [place] AT  1000 PM AND 1 PRESURGERY DRINK THE DAY OF THE PROCEDURE 3 HOURS PRIOR TO SCHEDULED SURGERY. NO SOLIDS AFTER MIDNIGHT THE DAY PRIOR TO THE SURGERY. NOTHING BY MOUTH EXCEPT CLEAR LIQUIDS UNTIL THREE HOURS PRIOR TO SCHEDULED SURGERY. PLEASE FINISH PRESURGERY ENSURE DRINK PER SURGEON ORDER 3 HOURS PRIOR TO SCHEDULED SURGERY TIME WHICH NEEDS TO BE COMPLETED AT: 9:00 AM. MAKE SURE TO DRINK PLENTY LIQUIDS ON THE PREP DAY.  CLEAR LIQUID DIET  Foods Allowed                                                                     Foods Excluded  Coffee and tea, regular and decaf                             liquids that you cannot  Plain Jell-O any favor except red or purple                                           see through such as: Fruit ices (not with fruit pulp)                                     milk, soups, orange juice  Iced Popsicles                                    All solid food Carbonated beverages, regular and diet                                    Cranberry, grape and apple juices Sports drinks like Gatorade Lightly seasoned clear broth or consume(fat free) Sugar, honey syrup  Sample Menu Breakfast                                Lunch                                     Supper Cranberry juice  Beef broth                            Chicken broth Jell-O                                     Grape juice                            Apple juice Coffee or tea                        Jell-O                                      Popsicle                                                Coffee or tea                        Coffee or tea  _____________________________________________________________________   BRUSH YOUR TEETH MORNING OF SURGERY AND RINSE YOUR MOUTH OUT, NO CHEWING GUM CANDY OR MINTS.    Take these medicines the morning of surgery with A SIP OF WATER: AMOXICILLIN,OMEPRAZOLE,SYNTHROID.                               You may not have any metal on your body including hair pins and              piercings  Do not wear jewelry, lotions, powders or perfumes, deodorant             Men may shave face and neck.   Do not bring valuables to the hospital. [place].  Contacts, dentures or bridgework may not be worn into surgery.  Leave suitcase in the car. After surgery it may be brought to your room.     Patients discharged the day of surgery will not be allowed to drive home. IF YOU ARE HAVING SURGERY AND GOING HOME THE SAME DAY, YOU MUST HAVE AN ADULT TO DRIVE YOU HOME AND BE WITH YOU FOR 24 HOURS. YOU MAY GO HOME BY TAXI OR UBER OR ORTHERWISE, BUT AN ADULT MUST ACCOMPANY YOU HOME AND STAY WITH YOU FOR 24 HOURS.  Name and phone number of your driver:  Special Instructions: N/A              Please read over the following fact sheets you were given: _____________________________________________________________________          [facility] - Preparing for Surgery Before surgery, you can play an important role.  Because skin is not sterile, your skin needs to be as free of germs as possible.  You can reduce the number of germs on your skin by washing with CHG (chlorahexidine gluconate) soap before surgery.  CHG is an antiseptic cleaner which kills germs and bonds with the skin to continue killing germs  even after washing. Please DO NOT use if you have an allergy to CHG or  antibacterial soaps.  If your skin becomes reddened/irritated stop using the CHG and inform your nurse when you arrive at Short Stay. Do not shave (including legs and underarms) for at least 48 hours prior to the first CHG shower.  You may shave your face/neck. Please follow these instructions carefully:  1.  Shower with CHG Soap the night before surgery and the  morning of Surgery.  2.  If you choose to wash your hair, wash your hair first as usual with your  normal  shampoo.  3.  After you shampoo, rinse your hair and body thoroughly to remove the  shampoo.                           4.  Use CHG as you would any other liquid soap.  You can apply chg directly  to the skin and wash                       Gently with a scrungie or clean washcloth.  5.  Apply the CHG Soap to your body ONLY FROM THE NECK DOWN.   Do not use on face/ open                           Wound or open sores. Avoid contact with eyes, ears mouth and genitals (private parts).                       Wash face,  Genitals (private parts) with your normal soap.             6.  Wash thoroughly, paying special attention to the area where your surgery  will be performed.  7.  Thoroughly rinse your body with warm water from the neck down.  8.  DO NOT shower/wash with your normal soap after using and rinsing off  the CHG Soap.                9.  Pat yourself dry with a clean towel.            10.  Wear clean pajamas.            11.  Place clean sheets on your bed the night of your first shower and do not  sleep with pets. Day of Surgery : Do not apply any lotions/deodorants the morning of surgery.  Please wear clean clothes to the hospital/surgery center.  FAILURE TO FOLLOW THESE INSTRUCTIONS MAY RESULT IN THE CANCELLATION OF YOUR SURGERY PATIENT SIGNATURE_________________________________  NURSE SIGNATURE__________________________________  ________________________________________________________________________   [name]  An incentive spirometer is a tool that can help keep your lungs clear and active. This tool measures how well you are filling your lungs with each breath. Taking long deep breaths may help reverse or decrease the chance of developing breathing (pulmonary) problems (especially infection) following:  A long period of time when you are unable to move or be active. BEFORE THE PROCEDURE   If the spirometer includes an indicator to show your best effort, your nurse or respiratory therapist will set it to a desired goal.  If possible, sit up straight or lean slightly forward. Try not to slouch.  Hold the incentive spirometer in an upright position. INSTRUCTIONS FOR USE  1. Sit on the  edge of your bed if possible, or sit up as far as you can in bed or on a chair. 2. Hold the incentive spirometer in an upright position. 3. Breathe out normally. 4. Place the mouthpiece in your mouth and seal your lips tightly around it. 5. Breathe in slowly and as deeply as possible, raising the piston or the ball toward the top of the column. 6. Hold your breath for 3-5 seconds or for as long as possible. Allow the piston or ball to fall to the bottom of the column. 7. Remove the mouthpiece from your mouth and breathe out normally. 8. Rest for a few seconds and repeat Steps 1 through 7 at least 10 times every 1-2 hours when you are awake. Take your time and take a few normal breaths between deep breaths. 9. The spirometer may include an indicator to show your best effort. Use the indicator as a goal to work toward during each repetition. 10. After each set of 10 deep breaths, practice coughing to be sure your lungs are clear. If you have an incision (the cut made at the time of surgery), support your incision when coughing by placing a pillow or rolled up towels firmly against it. Once you are able to get out of bed, walk around indoors and cough well. You may stop using the incentive spirometer when  instructed by your caregiver.  RISKS AND COMPLICATIONS  Take your time so you do not get dizzy or light-headed.  If you are in pain, you may need to take or ask for pain medication before doing incentive spirometry. It is harder to take a deep breath if you are having pain. AFTER USE  Rest and breathe slowly and easily.  It can be helpful to keep track of a log of your progress. Your caregiver can provide you with a simple table to help with this. If you are using the spirometer at home, follow these instructions: [place] IF:   You are having difficultly using the spirometer.  You have trouble using the spirometer as often as instructed.  Your pain medication is not giving enough relief while using the spirometer.  You develop fever of 100.5 F (38.1 C) or higher. SEEK IMMEDIATE MEDICAL CARE IF:   You cough up bloody sputum that had not been present before.  You develop fever of 102 F (38.9 C) or greater.  You develop worsening pain at or near the incision site. MAKE SURE YOU:   Understand these instructions.  Will watch your condition.  Will get help right away if you are not doing well or get worse. Document Released: 10/27/2006 Document Revised: 09/08/2011 Document Reviewed: 12/28/2006 [facility] Patient Information 2014 [place], [place].   ________________________________________________________________________

## 2020-08-28 ENCOUNTER — Encounter (HOSPITAL_COMMUNITY): Payer: Self-pay

## 2020-08-28 ENCOUNTER — Other Ambulatory Visit: Payer: Self-pay

## 2020-08-28 ENCOUNTER — Encounter (HOSPITAL_COMMUNITY)
Admission: RE | Admit: 2020-08-28 | Discharge: 2020-08-28 | Disposition: A | Discharge status: Home / Self Care | Payer: BC Managed Care – PPO | Source: Ambulatory Visit | Attending: Surgery | Admitting: Surgery

## 2020-08-28 DIAGNOSIS — Z01812 Encounter for preprocedural laboratory examination: Secondary | ICD-10-CM | POA: Insufficient documentation

## 2020-08-28 LAB — CBC
HCT: 44 % (ref 39.0–52.0)
Hemoglobin: 14.6 g/dL (ref 13.0–17.0)
MCH: 31.8 pg (ref 26.0–34.0)
MCHC: 33.2 g/dL (ref 30.0–36.0)
MCV: 95.9 fL (ref 80.0–100.0)
Platelets: 251 10*3/uL (ref 150–400)
RBC: 4.59 MIL/uL (ref 4.22–5.81)
RDW: 13.8 % (ref 11.5–15.5)
WBC: 9.9 10*3/uL (ref 4.0–10.5)
nRBC: 0 % (ref 0.0–0.2)

## 2020-08-28 LAB — BASIC METABOLIC PANEL
Anion gap: 11 (ref 5–15)
BUN: 12 mg/dL (ref 8–23)
CO2: 24 mmol/L (ref 22–32)
Calcium: 8.9 mg/dL (ref 8.9–10.3)
Chloride: 103 mmol/L (ref 98–111)
Creatinine, Ser: 1.22 mg/dL (ref 0.61–1.24)
GFR, Estimated: >60 mL/min (ref >60)
Glucose, Bld: 141 mg/dL — ABNORMAL HIGH (ref 70–99)
Potassium: 4.4 mmol/L (ref 3.5–5.1)
Sodium: 138 mmol/L (ref 135–145)

## 2020-08-28 NOTE — Progress Notes (Signed)
COVID Vaccine Completed: Yes Date COVID Vaccine completed: 07/13/20 COVID vaccine manufacturer: Pfizer      PCP - Dr. Ria Bush Cardiologist - Dr. Fransico Him. LOV: 02/10/19  Chest x-ray -  EKG -  Stress Test -  ECHO -  Cardiac Cath -  Pacemaker/ICD device last checked:  Sleep Study -  CPAP -   Fasting Blood Sugar -  Checks Blood Sugar _____ times a day  Blood Thinner Instructions: Aspirin Instructions: Last Dose:  Anesthesia review: Hx: Coronary artery calcifications.  Patient denies shortness of breath, fever, cough and chest pain at PAT appointment   Patient verbalized understanding of instructions that were given to them at the PAT appointment. Patient was also instructed that they will need to review over the PAT instructions again at home before surgery.

## 2020-09-04 MED ORDER — BUPIVACAINE LIPOSOME 1.3 % IJ SUSP
20.0000 mL | Freq: Once | INTRAMUSCULAR | Status: DC
  Filled 2020-09-04: qty 20

## 2020-09-05 ENCOUNTER — Other Ambulatory Visit: Payer: Self-pay

## 2020-09-05 ENCOUNTER — Encounter (HOSPITAL_COMMUNITY)
Admission: RE | Disposition: A | Discharge status: Home / Self Care | Payer: Self-pay | Source: Home / Self Care | Attending: Surgery

## 2020-09-05 ENCOUNTER — Encounter (HOSPITAL_COMMUNITY): Payer: Self-pay | Admitting: Surgery

## 2020-09-05 ENCOUNTER — Inpatient Hospital Stay (HOSPITAL_COMMUNITY): Payer: BC Managed Care – PPO | Admitting: Certified Registered Nurse Anesthetist

## 2020-09-05 ENCOUNTER — Inpatient Hospital Stay (HOSPITAL_COMMUNITY)
Admission: RE | Admit: 2020-09-05 | Discharge: 2020-09-08 | DRG: 349 | Disposition: A | Discharge status: Home / Self Care | Payer: BC Managed Care – PPO | Attending: Surgery | Admitting: Surgery

## 2020-09-05 ENCOUNTER — Inpatient Hospital Stay (HOSPITAL_COMMUNITY): Payer: BC Managed Care – PPO | Admitting: Physician Assistant

## 2020-09-05 DIAGNOSIS — I10 Essential (primary) hypertension: Secondary | ICD-10-CM | POA: Diagnosis not present

## 2020-09-05 DIAGNOSIS — Z96651 Presence of right artificial knee joint: Secondary | ICD-10-CM | POA: Diagnosis not present

## 2020-09-05 DIAGNOSIS — Z79890 Hormone replacement therapy (postmenopausal): Secondary | ICD-10-CM | POA: Diagnosis not present

## 2020-09-05 DIAGNOSIS — Z85038 Personal history of other malignant neoplasm of large intestine: Secondary | ICD-10-CM | POA: Diagnosis not present

## 2020-09-05 DIAGNOSIS — Z8719 Personal history of other diseases of the digestive system: Secondary | ICD-10-CM

## 2020-09-05 DIAGNOSIS — Z79899 Other long term (current) drug therapy: Secondary | ICD-10-CM | POA: Diagnosis not present

## 2020-09-05 DIAGNOSIS — Z789 Other specified health status: Secondary | ICD-10-CM

## 2020-09-05 DIAGNOSIS — F1090 Alcohol use, unspecified, uncomplicated: Secondary | ICD-10-CM

## 2020-09-05 DIAGNOSIS — Z87891 Personal history of nicotine dependence: Secondary | ICD-10-CM | POA: Diagnosis not present

## 2020-09-05 DIAGNOSIS — C184 Malignant neoplasm of transverse colon: Secondary | ICD-10-CM | POA: Diagnosis present

## 2020-09-05 DIAGNOSIS — C186 Malignant neoplasm of descending colon: Secondary | ICD-10-CM | POA: Diagnosis not present

## 2020-09-05 DIAGNOSIS — Z6831 Body mass index (BMI) 31.0-31.9, adult: Secondary | ICD-10-CM | POA: Diagnosis not present

## 2020-09-05 DIAGNOSIS — E039 Hypothyroidism, unspecified: Secondary | ICD-10-CM | POA: Diagnosis present

## 2020-09-05 DIAGNOSIS — Z7289 Other problems related to lifestyle: Secondary | ICD-10-CM

## 2020-09-05 DIAGNOSIS — Z9049 Acquired absence of other specified parts of digestive tract: Secondary | ICD-10-CM

## 2020-09-05 DIAGNOSIS — I2510 Atherosclerotic heart disease of native coronary artery without angina pectoris: Secondary | ICD-10-CM | POA: Diagnosis present

## 2020-09-05 DIAGNOSIS — E785 Hyperlipidemia, unspecified: Secondary | ICD-10-CM | POA: Diagnosis not present

## 2020-09-05 DIAGNOSIS — E669 Obesity, unspecified: Secondary | ICD-10-CM | POA: Diagnosis not present

## 2020-09-05 DIAGNOSIS — K219 Gastro-esophageal reflux disease without esophagitis: Secondary | ICD-10-CM | POA: Diagnosis present

## 2020-09-05 DIAGNOSIS — Z800 Family history of malignant neoplasm of digestive organs: Secondary | ICD-10-CM

## 2020-09-05 DIAGNOSIS — I700 Atherosclerosis of aorta: Secondary | ICD-10-CM | POA: Diagnosis present

## 2020-09-05 DIAGNOSIS — Z8249 Family history of ischemic heart disease and other diseases of the circulatory system: Secondary | ICD-10-CM | POA: Diagnosis not present

## 2020-09-05 DIAGNOSIS — C189 Malignant neoplasm of colon, unspecified: Secondary | ICD-10-CM | POA: Diagnosis not present

## 2020-09-05 HISTORY — PX: COLON RESECTION: SHX5231

## 2020-09-05 LAB — TYPE AND SCREEN
ABO/RH(D): A POS
Antibody Screen: NEGATIVE

## 2020-09-05 SURGERY — COLON RESECTION LAPAROSCOPIC
Anesthesia: General

## 2020-09-05 MED ORDER — CHLORHEXIDINE GLUCONATE 0.12 % MT SOLN
15.0000 mL | Freq: Once | OROMUCOSAL | Status: AC
  Administered 2020-09-05: 15 mL via OROMUCOSAL

## 2020-09-05 MED ORDER — IBUPROFEN 400 MG PO TABS
600.0000 mg | ORAL_TABLET | Freq: Four times a day (QID) | ORAL | Status: DC | PRN
  Administered 2020-09-05 – 2020-09-06 (×2): 600 mg via ORAL
  Filled 2020-09-05 (×2): qty 1

## 2020-09-05 MED ORDER — KETAMINE HCL 10 MG/ML IJ SOLN
INTRAMUSCULAR | Status: DC | PRN
  Administered 2020-09-05: 50 mg via INTRAVENOUS

## 2020-09-05 MED ORDER — BUPIVACAINE LIPOSOME 1.3 % IJ SUSP
INTRAMUSCULAR | Status: DC | PRN
  Administered 2020-09-05: 20 mL

## 2020-09-05 MED ORDER — HYDRALAZINE HCL 20 MG/ML IJ SOLN
INTRAMUSCULAR | Status: AC
  Filled 2020-09-05: qty 1

## 2020-09-05 MED ORDER — ONDANSETRON HCL 4 MG/2ML IJ SOLN: 4.0000 mg | Freq: Four times a day (QID) | INTRAMUSCULAR | Status: DC | PRN

## 2020-09-05 MED ORDER — METRONIDAZOLE 500 MG PO TABS: 1000.0000 mg | ORAL_TABLET | ORAL | Status: DC

## 2020-09-05 MED ORDER — DIPHENHYDRAMINE HCL 50 MG/ML IJ SOLN
12.5000 mg | Freq: Four times a day (QID) | INTRAMUSCULAR | Status: DC | PRN
Start: 2020-09-05 — End: 2020-09-08

## 2020-09-05 MED ORDER — FENTANYL CITRATE (PF) 100 MCG/2ML IJ SOLN
INTRAMUSCULAR | Status: AC
  Filled 2020-09-05: qty 2

## 2020-09-05 MED ORDER — PROPOFOL 10 MG/ML IV BOLUS
INTRAVENOUS | Status: DC | PRN
  Administered 2020-09-05: 150 mg via INTRAVENOUS

## 2020-09-05 MED ORDER — LEVOTHYROXINE SODIUM 100 MCG PO TABS
100.0000 ug | ORAL_TABLET | Freq: Every day | ORAL | Status: DC
  Administered 2020-09-06 – 2020-09-08 (×3): 100 ug via ORAL
  Filled 2020-09-05 (×3): qty 1

## 2020-09-05 MED ORDER — ENSURE PRE-SURGERY PO LIQD
296.0000 mL | Freq: Once | ORAL | Status: DC
  Filled 2020-09-05: qty 296

## 2020-09-05 MED ORDER — BUPIVACAINE-EPINEPHRINE 0.25% -1:200000 IJ SOLN
INTRAMUSCULAR | Status: AC
  Filled 2020-09-05: qty 1

## 2020-09-05 MED ORDER — LACTATED RINGERS IV SOLN: INTRAVENOUS | Status: DC

## 2020-09-05 MED ORDER — FENTANYL CITRATE (PF) 250 MCG/5ML IJ SOLN
INTRAMUSCULAR | Status: AC
  Filled 2020-09-05: qty 5

## 2020-09-05 MED ORDER — ONDANSETRON HCL 4 MG PO TABS
4.0000 mg | ORAL_TABLET | Freq: Four times a day (QID) | ORAL | Status: DC | PRN
  Administered 2020-09-06 (×2): 4 mg via ORAL
  Filled 2020-09-05 (×2): qty 1

## 2020-09-05 MED ORDER — SUGAMMADEX SODIUM 200 MG/2ML IV SOLN
INTRAVENOUS | Status: DC | PRN
  Administered 2020-09-05: 200 mg via INTRAVENOUS

## 2020-09-05 MED ORDER — NEOMYCIN SULFATE 500 MG PO TABS: 1000.0000 mg | ORAL_TABLET | ORAL | Status: DC

## 2020-09-05 MED ORDER — ALUM & MAG HYDROXIDE-SIMETH 200-200-20 MG/5ML PO SUSP: 30.0000 mL | Freq: Four times a day (QID) | ORAL | Status: DC | PRN

## 2020-09-05 MED ORDER — PROPOFOL 10 MG/ML IV BOLUS
INTRAVENOUS | Status: AC
  Filled 2020-09-05: qty 20

## 2020-09-05 MED ORDER — ATORVASTATIN CALCIUM 40 MG PO TABS
80.0000 mg | ORAL_TABLET | Freq: Every day | ORAL | Status: DC
  Administered 2020-09-06 – 2020-09-08 (×3): 80 mg via ORAL
  Filled 2020-09-05 (×3): qty 2

## 2020-09-05 MED ORDER — BISACODYL 5 MG PO TBEC: 20.0000 mg | DELAYED_RELEASE_TABLET | Freq: Once | ORAL | Status: DC

## 2020-09-05 MED ORDER — ENSURE SURGERY PO LIQD
237.0000 mL | Freq: Two times a day (BID) | ORAL | Status: DC
  Administered 2020-09-06 – 2020-09-07 (×3): 237 mL via ORAL

## 2020-09-05 MED ORDER — ONDANSETRON HCL 4 MG/2ML IJ SOLN
INTRAMUSCULAR | Status: DC | PRN
  Administered 2020-09-05: 4 mg via INTRAVENOUS

## 2020-09-05 MED ORDER — EZETIMIBE 10 MG PO TABS
10.0000 mg | ORAL_TABLET | Freq: Every day | ORAL | Status: DC
  Administered 2020-09-06 – 2020-09-08 (×3): 10 mg via ORAL
  Filled 2020-09-05 (×3): qty 1

## 2020-09-05 MED ORDER — SODIUM CHLORIDE 0.9 % IV SOLN
2.0000 g | INTRAVENOUS | Status: AC
  Administered 2020-09-05: 2 g via INTRAVENOUS
  Filled 2020-09-05: qty 2

## 2020-09-05 MED ORDER — HYDRALAZINE HCL 20 MG/ML IJ SOLN
INTRAMUSCULAR | Status: DC | PRN
  Administered 2020-09-05: 5 mg via INTRAVENOUS
  Administered 2020-09-05: 2.5 mg via INTRAVENOUS

## 2020-09-05 MED ORDER — HYDRALAZINE HCL 20 MG/ML IJ SOLN: 10.0000 mg | INTRAMUSCULAR | Status: DC | PRN

## 2020-09-05 MED ORDER — LIDOCAINE HCL (PF) 2 % IJ SOLN
INTRAMUSCULAR | Status: DC | PRN
  Administered 2020-09-05: 1.5 mg/kg/h via INTRADERMAL

## 2020-09-05 MED ORDER — MIDAZOLAM HCL 2 MG/2ML IJ SOLN
INTRAMUSCULAR | Status: AC
  Filled 2020-09-05: qty 2

## 2020-09-05 MED ORDER — ALVIMOPAN 12 MG PO CAPS
12.0000 mg | ORAL_CAPSULE | ORAL | Status: AC
  Administered 2020-09-05: 12 mg via ORAL
  Filled 2020-09-05: qty 1

## 2020-09-05 MED ORDER — BUPIVACAINE-EPINEPHRINE (PF) 0.25% -1:200000 IJ SOLN
INTRAMUSCULAR | Status: DC | PRN
  Administered 2020-09-05: 30 mL via PERINEURAL

## 2020-09-05 MED ORDER — CHLORHEXIDINE GLUCONATE CLOTH 2 % EX PADS: 6.0000 | MEDICATED_PAD | Freq: Once | CUTANEOUS | Status: DC

## 2020-09-05 MED ORDER — TRAMADOL HCL 50 MG PO TABS
50.0000 mg | ORAL_TABLET | Freq: Four times a day (QID) | ORAL | Status: DC | PRN
  Administered 2020-09-05: 50 mg via ORAL
  Filled 2020-09-05: qty 1

## 2020-09-05 MED ORDER — ROCURONIUM BROMIDE 10 MG/ML (PF) SYRINGE
PREFILLED_SYRINGE | INTRAVENOUS | Status: AC
  Filled 2020-09-05: qty 10

## 2020-09-05 MED ORDER — DEXAMETHASONE SODIUM PHOSPHATE 10 MG/ML IJ SOLN
INTRAMUSCULAR | Status: DC | PRN
  Administered 2020-09-05: 5 mg via INTRAVENOUS

## 2020-09-05 MED ORDER — SIMETHICONE 80 MG PO CHEW: 40.0000 mg | CHEWABLE_TABLET | Freq: Four times a day (QID) | ORAL | Status: DC | PRN

## 2020-09-05 MED ORDER — HYDROMORPHONE HCL 1 MG/ML IJ SOLN: 0.5000 mg | INTRAMUSCULAR | Status: DC | PRN

## 2020-09-05 MED ORDER — 0.9 % SODIUM CHLORIDE (POUR BTL) OPTIME
TOPICAL | Status: DC | PRN
  Administered 2020-09-05: 1000 mL

## 2020-09-05 MED ORDER — POLYETHYLENE GLYCOL 3350 17 GM/SCOOP PO POWD
1.0000 | Freq: Once | ORAL | Status: DC
  Filled 2020-09-05: qty 255

## 2020-09-05 MED ORDER — DIPHENHYDRAMINE HCL 12.5 MG/5ML PO ELIX
12.5000 mg | ORAL_SOLUTION | Freq: Four times a day (QID) | ORAL | Status: DC | PRN
Start: 2020-09-05 — End: 2020-09-08

## 2020-09-05 MED ORDER — KETAMINE HCL 10 MG/ML IJ SOLN
INTRAMUSCULAR | Status: AC
  Filled 2020-09-05: qty 1

## 2020-09-05 MED ORDER — PANTOPRAZOLE SODIUM 40 MG PO TBEC
40.0000 mg | DELAYED_RELEASE_TABLET | Freq: Every day | ORAL | Status: DC
  Administered 2020-09-06 – 2020-09-08 (×3): 40 mg via ORAL
  Filled 2020-09-05 (×3): qty 1

## 2020-09-05 MED ORDER — EPHEDRINE SULFATE 50 MG/ML IJ SOLN
INTRAMUSCULAR | Status: DC | PRN
  Administered 2020-09-05: 10 mg via INTRAVENOUS

## 2020-09-05 MED ORDER — ENSURE PRE-SURGERY PO LIQD
592.0000 mL | Freq: Once | ORAL | Status: DC
  Filled 2020-09-05: qty 592

## 2020-09-05 MED ORDER — LIDOCAINE 2% (20 MG/ML) 5 ML SYRINGE
INTRAMUSCULAR | Status: AC
  Filled 2020-09-05: qty 5

## 2020-09-05 MED ORDER — ROCURONIUM BROMIDE 10 MG/ML (PF) SYRINGE
PREFILLED_SYRINGE | INTRAVENOUS | Status: DC | PRN
  Administered 2020-09-05 (×2): 20 mg via INTRAVENOUS
  Administered 2020-09-05: 60 mg via INTRAVENOUS

## 2020-09-05 MED ORDER — HEPARIN SODIUM (PORCINE) 5000 UNIT/ML IJ SOLN
5000.0000 [IU] | Freq: Three times a day (TID) | INTRAMUSCULAR | Status: DC
  Administered 2020-09-05 – 2020-09-08 (×8): 5000 [IU] via SUBCUTANEOUS
  Filled 2020-09-05 (×8): qty 1

## 2020-09-05 MED ORDER — MIDAZOLAM HCL 5 MG/5ML IJ SOLN
INTRAMUSCULAR | Status: DC | PRN
  Administered 2020-09-05 (×2): 1 mg via INTRAVENOUS

## 2020-09-05 MED ORDER — ALVIMOPAN 12 MG PO CAPS
12.0000 mg | ORAL_CAPSULE | Freq: Two times a day (BID) | ORAL | Status: DC
  Administered 2020-09-06 (×2): 12 mg via ORAL
  Filled 2020-09-05 (×2): qty 1

## 2020-09-05 MED ORDER — LABETALOL HCL 5 MG/ML IV SOLN
INTRAVENOUS | Status: AC
  Filled 2020-09-05: qty 4

## 2020-09-05 MED ORDER — LIDOCAINE HCL (CARDIAC) PF 100 MG/5ML IV SOSY
PREFILLED_SYRINGE | INTRAVENOUS | Status: DC | PRN
  Administered 2020-09-05: 60 mg via INTRAVENOUS

## 2020-09-05 MED ORDER — LABETALOL HCL 5 MG/ML IV SOLN
INTRAVENOUS | Status: DC | PRN
  Administered 2020-09-05 (×3): 2.5 mg via INTRAVENOUS

## 2020-09-05 MED ORDER — HEPARIN SODIUM (PORCINE) 5000 UNIT/ML IJ SOLN
5000.0000 [IU] | Freq: Once | INTRAMUSCULAR | Status: AC
  Administered 2020-09-05: 5000 [IU] via SUBCUTANEOUS
  Filled 2020-09-05: qty 1

## 2020-09-05 MED ORDER — LIDOCAINE 2% (20 MG/ML) 5 ML SYRINGE
INTRAMUSCULAR | Status: DC | PRN
  Administered 2020-09-05: 60 mg via INTRAVENOUS

## 2020-09-05 MED ORDER — FENTANYL CITRATE (PF) 100 MCG/2ML IJ SOLN
25.0000 ug | INTRAMUSCULAR | Status: DC | PRN
  Administered 2020-09-05: 50 ug via INTRAVENOUS

## 2020-09-05 MED ORDER — ACETAMINOPHEN 500 MG PO TABS
1000.0000 mg | ORAL_TABLET | Freq: Four times a day (QID) | ORAL | Status: DC
  Administered 2020-09-05 – 2020-09-08 (×11): 1000 mg via ORAL
  Filled 2020-09-05 (×9): qty 2

## 2020-09-05 MED ORDER — FENTANYL CITRATE (PF) 100 MCG/2ML IJ SOLN
INTRAMUSCULAR | Status: DC | PRN
  Administered 2020-09-05 (×4): 50 ug via INTRAVENOUS
  Administered 2020-09-05: 100 ug via INTRAVENOUS
  Administered 2020-09-05: 50 ug via INTRAVENOUS

## 2020-09-05 MED ORDER — ORAL CARE MOUTH RINSE: 15.0000 mL | Freq: Once | OROMUCOSAL | Status: AC

## 2020-09-05 MED ORDER — ACETAMINOPHEN 500 MG PO TABS
1000.0000 mg | ORAL_TABLET | ORAL | Status: AC
  Administered 2020-09-05: 1000 mg via ORAL
  Filled 2020-09-05: qty 2

## 2020-09-05 SURGICAL SUPPLY — 73 items
APPLIER CLIP 5 13 M/L LIGAMAX5 (MISCELLANEOUS)
APPLIER CLIP ROT 10 11.4 M/L (STAPLE)
BLADE EXTENDED COATED 6.5IN (ELECTRODE) IMPLANT
CABLE HIGH FREQUENCY MONO STRZ (ELECTRODE) IMPLANT
CATH MUSHROOM 28FR (CATHETERS) IMPLANT
CATH MUSHROOM 30FR (CATHETERS) IMPLANT
CELLS DAT CNTRL 66122 CELL SVR (MISCELLANEOUS) IMPLANT
CLIP APPLIE 5 13 M/L LIGAMAX5 (MISCELLANEOUS) IMPLANT
CLIP APPLIE ROT 10 11.4 M/L (STAPLE) IMPLANT
COVER WAND RF STERILE (DRAPES) IMPLANT
DECANTER SPIKE VIAL GLASS SM (MISCELLANEOUS) ×2 IMPLANT
DERMABOND ADVANCED (GAUZE/BANDAGES/DRESSINGS) ×1
DERMABOND ADVANCED .7 DNX12 (GAUZE/BANDAGES/DRESSINGS) ×1 IMPLANT
DISSECTOR BLUNT TIP ENDO 5MM (MISCELLANEOUS) IMPLANT
DRAIN CHANNEL 19F RND (DRAIN) IMPLANT
DRAPE SURG IRRIG POUCH 19X23 (DRAPES) ×2 IMPLANT
DRSG OPSITE POSTOP 4X10 (GAUZE/BANDAGES/DRESSINGS) IMPLANT
DRSG OPSITE POSTOP 4X6 (GAUZE/BANDAGES/DRESSINGS) ×2 IMPLANT
DRSG OPSITE POSTOP 4X8 (GAUZE/BANDAGES/DRESSINGS) IMPLANT
ELECT REM PT RETURN 15FT ADLT (MISCELLANEOUS) ×2 IMPLANT
EVACUATOR SILICONE 100CC (DRAIN) IMPLANT
GAUZE SPONGE 4X4 12PLY STRL (GAUZE/BANDAGES/DRESSINGS) IMPLANT
GLOVE SURG ENC MOIS LTX SZ7.5 (GLOVE) ×4 IMPLANT
GLOVE SURG UNDER LTX SZ8 (GLOVE) ×4 IMPLANT
GOWN STRL REUS W/TWL XL LVL3 (GOWN DISPOSABLE) ×8 IMPLANT
HOLDER FOLEY CATH W/STRAP (MISCELLANEOUS) ×2 IMPLANT
KIT TURNOVER KIT A (KITS) ×2 IMPLANT
LIGASURE IMPACT 36 18CM CVD LR (INSTRUMENTS) IMPLANT
NEEDLE INSUFFLATION 14GA 120MM (NEEDLE) IMPLANT
PACK COLON (CUSTOM PROCEDURE TRAY) ×2 IMPLANT
PAD POSITIONING PINK XL (MISCELLANEOUS) ×2 IMPLANT
PENCIL SMOKE EVACUATOR (MISCELLANEOUS) IMPLANT
PORT LAP GEL ALEXIS MED 5-9CM (MISCELLANEOUS) IMPLANT
RELOAD PROXIMATE 75MM BLUE (ENDOMECHANICALS) ×2 IMPLANT
RTRCTR WOUND ALEXIS 18CM MED (MISCELLANEOUS)
SCISSORS LAP 5X35 DISP (ENDOMECHANICALS) ×2 IMPLANT
SEALER TISSUE G2 STRG ARTC 35C (ENDOMECHANICALS) ×2 IMPLANT
SET IRRIG TUBING LAPAROSCOPIC (IRRIGATION / IRRIGATOR) ×2 IMPLANT
SET TUBE SMOKE EVAC HIGH FLOW (TUBING) ×2 IMPLANT
SLEEVE ADV FIXATION 5X100MM (TROCAR) ×6 IMPLANT
SPONGE DRAIN TRACH 4X4 STRL 2S (GAUZE/BANDAGES/DRESSINGS) IMPLANT
STAPLER ECHELON POWER CIR 29 (STAPLE) ×2 IMPLANT
STAPLER GUN LINEAR PROX 60 (STAPLE) IMPLANT
STAPLER PROXIMATE 75MM BLUE (STAPLE) ×2 IMPLANT
STAPLER VISISTAT 35W (STAPLE) IMPLANT
SUT ETHILON 3 0 PS 1 (SUTURE) IMPLANT
SUT PDS AB 1 CTX 36 (SUTURE) IMPLANT
SUT PDS AB 1 TP1 54 (SUTURE) IMPLANT
SUT PDS AB 1 TP1 96 (SUTURE) IMPLANT
SUT PROLENE 2 0 KS (SUTURE) ×2 IMPLANT
SUT PROLENE 2 0 SH DA (SUTURE) ×2 IMPLANT
SUT SILK 2 0 (SUTURE) ×2
SUT SILK 2 0 SH CR/8 (SUTURE) ×2 IMPLANT
SUT SILK 2-0 18XBRD TIE 12 (SUTURE) ×1 IMPLANT
SUT SILK 3 0 (SUTURE) ×1
SUT SILK 3 0 SH CR/8 (SUTURE) ×2 IMPLANT
SUT SILK 3-0 18XBRD TIE 12 (SUTURE) ×1 IMPLANT
SUT V-LOC BARB 180 2/0GR6 GS22 (SUTURE) ×2
SUT VIC AB 2-0 SH 27 (SUTURE)
SUT VIC AB 2-0 SH 27X BRD (SUTURE) IMPLANT
SUT VIC AB 3-0 SH 18 (SUTURE) IMPLANT
SUT VIC AB 3-0 SH 27 (SUTURE)
SUT VIC AB 3-0 SH 27X BRD (SUTURE) IMPLANT
SUT VICRYL 2 0 18  UND BR (SUTURE) ×1
SUT VICRYL 2 0 18 UND BR (SUTURE) ×1 IMPLANT
SUTURE V-LC BRB 180 2/0GR6GS22 (SUTURE) ×1 IMPLANT
SYS LAPSCP GELPORT 120MM (MISCELLANEOUS)
SYSTEM LAPSCP GELPORT 120MM (MISCELLANEOUS) IMPLANT
TOWEL OR 17X26 10 PK STRL BLUE (TOWEL DISPOSABLE) IMPLANT
TOWEL OR NON WOVEN STRL DISP B (DISPOSABLE) ×2 IMPLANT
TRAY IRRIG W/60CC SYR STRL (SET/KITS/TRAYS/PACK) ×2 IMPLANT
TROCAR ADV FIXATION 5X100MM (TROCAR) ×2 IMPLANT
TROCAR XCEL BLUNT TIP 100MML (ENDOMECHANICALS) IMPLANT

## 2020-09-05 NOTE — Transfer of Care (Signed)
Immediate Anesthesia Transfer of Care Note  Patient: Caleb Smith  Procedure(s) Performed: LAPAROSCOPIC  SEGMENTAL COLECTOMY, TAKE DOWN OF SPLEENIC FLEXURE  Patient Location: PACU  Anesthesia Type:General  Level of Consciousness: awake  Airway & Oxygen Therapy: Patient Spontanous Breathing and Patient connected to face mask oxygen  Post-op Assessment: Report given to RN and Post -op Vital signs reviewed and stable  Post vital signs: Reviewed and stable  Last Vitals:  Vitals Value Taken Time  BP 133/70 09/05/20 1552  Temp    Pulse 67 09/05/20 1555  Resp 22 09/05/20 1555  SpO2 98 % 09/05/20 1555  Vitals shown include unvalidated device data.  Last Pain:  Vitals:   09/05/20 1027  TempSrc: Oral  PainSc:       Patients Stated Pain Goal: 5 (39/68/86 [id number])  Complications: No complications documented.

## 2020-09-05 NOTE — Anesthesia Preprocedure Evaluation (Addendum)
Anesthesia Evaluation  Patient identified by MRN, date of birth, ID band Patient awake    Reviewed: Allergy & Precautions, NPO status , Patient's Chart, lab work & pertinent test results  Airway Mallampati: II  TM Distance: >3 FB Neck ROM: Full    Dental  (+) Dental Advisory Given, Missing,    Pulmonary neg pulmonary ROS,    Pulmonary exam normal breath sounds clear to auscultation       Cardiovascular + CAD  Normal cardiovascular exam Rhythm:Regular Rate:Normal  Stress Test 2019 normal   Neuro/Psych negative neurological ROS  negative psych ROS   GI/Hepatic Neg liver ROS, GERD  Controlled and Medicated,  Endo/Other  Hypothyroidism   Renal/GU negative Renal ROS  negative genitourinary   Musculoskeletal  (+) Arthritis ,   Abdominal   Peds  Hematology negative hematology ROS (+)   Anesthesia Other Findings Colon CA  Reproductive/Obstetrics                           Anesthesia Physical Anesthesia Plan  ASA: II  Anesthesia Plan: General   Post-op Pain Management:    Induction: Intravenous  PONV Risk Score and Plan: 2 and Midazolam, Dexamethasone and Ondansetron  Airway Management Planned: Oral ETT  Additional Equipment:   Intra-op Plan:   Post-operative Plan: Extubation in OR  Informed Consent: I have reviewed the patients History and Physical, chart, labs and discussed the procedure including the risks, benefits and alternatives for the proposed anesthesia with the patient or authorized representative who has indicated his/her understanding and acceptance.     Dental advisory given  Plan Discussed with: CRNA  Anesthesia Plan Comments:         Anesthesia Quick Evaluation

## 2020-09-05 NOTE — Anesthesia Procedure Notes (Signed)
Procedure Name: Intubation Date/Time: 09/05/2020 12:54 PM Performed by: Garrel Ridgel, CRNA Pre-anesthesia Checklist: Patient identified, Emergency Drugs available, Suction available and Patient being monitored Patient Re-evaluated:Patient Re-evaluated prior to induction Oxygen Delivery Method: Circle system utilized Preoxygenation: Pre-oxygenation with 100% oxygen Induction Type: IV induction Ventilation: Mask ventilation without difficulty Laryngoscope Size: Mac and 4 Grade View: Grade II Tube type: Oral Tube size: 7.5 mm Number of attempts: 1 Airway Equipment and Method: Stylet and Oral airway Placement Confirmation: ETT inserted through vocal cords under direct vision,  positive ETCO2 and breath sounds checked- equal and bilateral Secured at: 23 cm Tube secured with: Tape Dental Injury: Teeth and Oropharynx as per pre-operative assessment

## 2020-09-05 NOTE — H&P (Addendum)
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 He underwent a follow-up colonoscopy with Dr. Rush Landmark 06/07/20 which demonstrated hemorrhoids, one similar polyp in the distal transverse colon removed. She was seen in the distal transverse/proximal descending colon. A 4 cm Sessile/semi-sessile polyp at the distal transverse/proximal descending colon over to haustral folds that was removed piecemeal. Clips were placed. Complete closure is not possible. Pathology returned to her adenoma of the smaller polyp. No high-grade dysplasia. The larger piecemeal polypectomy returned with foci of invasive adenocarcinoma arising in an adenomatous polyp with high-grade dysplasia. He underwent staging CT chest/tendon/pelvis which demonstrated no evidence of metastatic disease. These were completed 06/19/20.  He previously had a colonoscopy completed 02/14/20 which showed flat polyps in the transverse colon, ascending colon, cecum. They were removed. A larger polyp was found 45 centers proximally as it was labeled with tattoo. This was not removed. He was referred by Dr. Laureen Abrahams to [facility] for consideration of EMR. In not attending this referral but did see Dr. Rush Landmark in follow-up in December. Pathology from his August colonoscopy showed fragments of sessile serrated polyps.  We spent time personally reviewing his CT scan. There are no clearly identified clips present in the lumen of the colon. There is a segment when viewed with a critical lie in the distal transverse colon that does look somewhat featureless and may correspond to the area of his polypectomy. Fortunately the area of concern was tattooed.  He denies any changes in his health or health history since we met aside from an uneventful encounter with COVID. States he is  back to his normal state of health. Denies chest pain or sob with activity. He took his bowel prep without issue - reports clear stool.  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 He denies any prior abdominal surgical history  FHx: Denies FHx of colorectal, breast, endometrial, ovarian or cervical cancer  Social: Denies use of tobacco/drugs; reports he drinks 2 beers per day. Denies any withdrawal type symptoms if he goes for days without a drink. He works for [employer]. He is here today with his daughter whom is a Armed forces technical officer with [employer].  Past Medical History:  Diagnosis Date  . Arthritis   . Colon cancer ([place])   . Coronary artery calcification seen on CAT scan 08/21/2017  . GERD (gastroesophageal reflux disease) 1995   s/p esoph dilation for stricture  . HLD (hyperlipidemia)   . Hypothyroidism   . Obesity 07/14/2014    Past Surgical History:  Procedure Laterality Date  . CARDIOVASCULAR STRESS TEST  07/2017   low risk study ([place])  . COLONOSCOPY  03/2003   small sigmoid polyp, rpt 5 yrs [name])  . COLONOSCOPY WITH PROPOFOL N/A 02/14/2020   SSP (Bonna Gains, Lennette Bihari, MD)  . COLONOSCOPY WITH PROPOFOL N/A 06/07/2020   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 (Mansouraty, Telford Nab., MD)  . ENDOSCOPIC MUCOSAL RESECTION N/A 06/07/2020   Procedure: ENDOSCOPIC MUCOSAL RESECTION;  Surgeon: Rush Landmark Telford Nab., MD;  Location: [place];  Service: Gastroenterology;  Laterality: N/A;  . ESOPHAGOGASTRODUODENOSCOPY  1995   s/p esophageal dilation  . FACIAL RECONSTRUCTION SURGERY  1990s   cow headbutted him  . FINGER SURGERY Left teenager   reattachment of 2nd,3rd,4th  . HEMOSTASIS CLIP PLACEMENT  06/07/2020   Procedure: HEMOSTASIS CLIP PLACEMENT;  Surgeon: Irving Copas., MD;  Location: [place];  Service: Gastroenterology;;  . POLYPECTOMY  06/07/2020   Procedure: POLYPECTOMY;   Surgeon: Irving Copas., MD;  Location: [place];  Service: Gastroenterology;;  . Lia Foyer LIFTING INJECTION  06/07/2020   Procedure: SUBMUCOSAL LIFTING INJECTION;  Surgeon: Irving Copas., MD;  Location: [place];  Service: Gastroenterology;;  . TONSILLECTOMY    . 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 TOTAL KNEE ARTHROPLASTY Right 08/09/2019   Procedure: RIGHT TOTAL KNEE ARTHROPLASTY-CEMENTED;  Surgeon: Meredith Pel, MD;  Location: [place];  Service: Orthopedics;  Laterality: Right;    Family History  Problem Relation Age of Onset  . Cancer Mother        breast  . Pancreatic cancer Mother   . Liver cancer Mother   . CAD Maternal Uncle 71       massive MI  . Suicidality Father        suicide  . Stroke Neg Hx   . Diabetes Neg Hx   . Hypertension Neg Hx   . Colon cancer Neg Hx   . Esophageal cancer Neg Hx   . Inflammatory bowel disease Neg Hx   . Rectal cancer Neg Hx   . Stomach cancer Neg Hx     Social:  reports that he has never smoked. He has quit using smokeless tobacco.  His smokeless tobacco use included chew. He reports current alcohol use. He reports that he does not use drugs.  Allergies: No Known Allergies  Medications: I have reviewed the patient's current medications.  No results found for this or any previous visit (from the past 48 hour(s)).  No results found.  ROS - all of the below systems have been reviewed with the patient and positives are indicated with bold text General: chills, fever or night sweats Eyes: blurry vision or double vision ENT: epistaxis or sore throat Allergy/Immunology: itchy/watery eyes or nasal congestion Hematologic/Lymphatic: bleeding problems, blood clots or swollen lymph nodes Endocrine: temperature intolerance or unexpected weight changes Breast: new or changing breast lumps or nipple discharge Resp: cough, shortness of breath, or wheezing CV: chest pain or dyspnea on exertion GI: as per  HPI GU: dysuria, trouble voiding, or hematuria MSK: joint pain or joint stiffness Neuro: TIA or stroke symptoms Derm: pruritus and skin lesion changes Psych: anxiety and depression  PE Blood pressure (!) 150/90, pulse 74, temperature 98 F (36.7 C), temperature source Oral, resp. rate 18, height 5' 7"  (1.702 m), weight 92.5 kg, SpO2 98 %. Constitutional: NAD; conversant, wearing mask Eyes: Moist conjunctiva; no lid lag; anicteric Lungs: Normal respiratory effort CV: RRR; no pitting edema GI: Abd soft, NT/ND; no palpable hepatosplenomegaly Psychiatric: Appropriate affect; alert and oriented x3  No results found for this or any previous visit (from the past 48 hour(s)).  No results found.   A/P: Caleb Smith Smith is a very pleasant [phone] with hx of HTN, HLD, Hypothyroidism here for newly diagnosed invasive adenocarcinoma found following piecemeal resection of a polyp that was presumably at the splenic flexure. This area has been previously tattooed intensity was apparent at the time of his colonoscopy.  Staging CT chest/abdomen/pelvis 06/19/20 demonstrated no evidence of metastatic disease  -The anatomy and physiology of the GI tract was discussed with the patient. The pathophysiology of colon cancer was discussed at length with associated pictures.  -We reviewed options going forward. We discussed a high risk of local recurrence given the piecemeal  nature of this as well as inability to complete the staging including potential for lymph node involvement. We discussed surveillance as an option but that this would not be standard of care. We discussed that this would entail including frequent endoscopic exams and potentially surveillance CT scans as well. We also discussed surgery as an option with this being the most definitive option. We discussed laparoscopic segmental colectomy of the involved segment due to robot unavailability today. We discussed scenarios were a left hemicolectomy may be  necessary. Possible takedown of splenic flexure. Possible flexible sigmoidoscopy. Final plans being based on where the tattoo is actually located. -The planned procedures, material risks (including, but not limited to, pain, bleeding, infection, scarring, need for blood transfusion, damage to surrounding structures- blood vessels/nerves/viscus/organs, damage to ureter, urine leak, leak from anastomosis, need for additional procedures, worsening of pre-existing medical conditions, need for stoma which may be permanent, hernia, recurrence, DVT/PE, pneumonia, heart attack, stroke, death) benefits and alternatives to surgery were discussed at length. The patient's questions were answered to his satisfaction, he voiced understanding and elected to proceed with surgery. Additionally, we discussed typical postoperative expectations and the recovery process.  Nadeen Landau, MD [facility] Use [email or web address] to contact on call provider

## 2020-09-05 NOTE — Op Note (Signed)
PATIENT: Caleb Smith  70 y.o. male  Patient Care Team: Ria Bush, MD as PCP - General (Family Medicine)  PREOP DIAGNOSIS: colon cancer  POSTOP DIAGNOSIS: Distal transverse colon cancer  PROCEDURE: 1. Laproscopic segmental colectomy 2. Takedown of splenic flexure 3. Flexible sigmoidoscopy 4. Bilateral transversus abdominus plane blocks  SURGEON: Sharon Mt. Dema Severin, MD  ASSISTANT: Leighton Ruff, MD  ANESTHESIA: General endotracheal  EBL: 50 mL Total I/O In: 1000 [I.V.:1000] Out: 220 [Urine:200; Blood:20]  DRAINS: None  SPECIMEN: 1. Distal transverse colon and splenic flexure 2. Proximal donut 3. Distal donut  COUNTS: Sponge, needle and instrument counts were reported correct x2  FINDINGS:  Tattoo identified in the distal transverse colon with small mass vs scarring just distal to this. Splenic flexure mobilized to facilitate the resection. Specimen included at least 5 cm gross margins on both sides. Left branch of middle colic included. 29 mm EEA anastomosis fashioned between the transverse colon and the mid descending colon. Flexible sigmoidoscopy performed to ensure no air leak.  NARRATIVE: Informed consent was verified. The patient was taken to the operating room, placed supine on the operating table and SCD's were applied. General endotracheal anesthesia was induced. The patient was then positioned in the lithotomy position with Allen stirrups.    An OG tube was placed by anesthesia and confirmed to be to suction.  At Palmer's point, a stab incision was created.  The Veress needle was introduced in the peritoneal cavity and the first attempt.  Intraperitoneal location was confirmed with the aspiration and saline drop test.  Pneumoperitoneum was established with CO2 to max and pressure 25mmHg.  A 5 mm optical viewing trocar was then placed this location.  Laparoscope was inserted and intraperitoneal inspection confirmed no evidence of Veress needle or  trocar site complications.  Bilateral transversus abdominis plane blocks were then created with a dilute mixture of Exparel with 0.25% Marcaine.  3 additional 5 Miller trochars were placed under direct visualization-2 in the right hemiabdomen and 1 in the supraumbilical midline.  The abdomen is then surveyed.  The tattoo was ultimately found in the distal transverse colon.  There was no peritoneal abnormalities.  The liver appears normal.   He was first positioned in Trendelenburg with some left side up.  The sigmoid colon was identified.  This was then mobilized off of the intersigmoid fossa.  The descending colon was then mobilized by incising the [place] line of Toldt up to the level of the splenic flexure.  The splenic flexure was approached.  He was then positioned in reverse Trendelenburg.  The splenic flexure was mobilized as much as could be from this approach.  Then, attention was turned to the transverse colon approach.  The omentum was grasped and elevated anteriorly.  The transverse colon was retracted inferiorly.  The lesser sac was gained.  Omentum was taken off of the transverse colon beginning at the level of the mid transverse colon all the way over the splenic flexure.  The splenic flexure mobilization was then completed from this approach.  The retroperitoneum was inspected and noted to be hemostatic.  At this point, there was more than adequate reach of the entire splenic flexure into his right lower quadrant.  Attention was then turned to the extracorporeal portion of the procedure.  An extraction incision was created in the upper midline abdomen.  Dissection was carried down through the subcutaneous tissue electrocautery.  The fascia was incised in the midline.  The peritoneum was opened.  An [employer]  wound protector was placed.  The distal transverse colon was then delivered onto the field.  Towels were placed around the operative site.  Proximal distal points of transection were identified  such that the left branch of the middle colic and a nice branch of the left colic were left perfusing the remaining segments of colon.  This also allowed for adequate gross margins.  Windows were created the mesentery.  Beginning with the distal point of transection, the pursestring device was applied.  The colon was then divided sharply.  A 2-0 Prolene on a Keith needle was passed through the pursestring her.  The device was removed.  Belt loops of 3-0 silk were then placed around the pursestring suture line.  A 29 mm EEA stapler was selected.  The anvil was placed in the pursestring tied.  The planned staple line was inspected and noted to be clear of any significant amounts of fat.  There is a nice pulse in the mesentery extending out to the divided segment of colon.  The intervening mesentery between our planned proximal point of division and this location was then divided using the Enseal device.  This included the left branch of the middle colic.  The proximal point of transection was approached.  A 75 mm GIA blue load stapler was used to divide about this level.  The specimen was inspected and there was scarring versus a small mass within the lumen just distal to the tattoo.  The staple line is proximal.  The specimen is then passed off.  The divided mesentery was inspected and hemostasis appreciated.  Attention was then turned to creating the anastomosis.  Proximal to the staple line, a colotomy was created.  The EEA stapler was introduced.  The spike was deployed just anterior to the staple line.  The components were then mated.  Orientation was confirmed such that there is no twisting of the proximal or distal aspects of the colon.  The staple was then closed, held, and fired.  The donuts were then inspected on the back table and noted to be intact.  These were passed off the specimens.  I then changed gown/gloves.  The colotomy was closed using two 2-0 V-Loc sutures in a Connell fashion.  The closure  was inspected and noted to be complete.  Given the double staple technique, attention was then turned to performing a leak test.  I passed a flexible sigmoidoscope from below.  My partner remained above and gently occluded the bowel proximal to the anastomosis.  The abdomen was filled with sterile saline.  With good distention of the colon, it is airtight.  Air was then evacuated.  The laparoscopic ports were removed under direct visualization and hemostatic.  We then turned our attention to the clean portion of the procedure. The abdomen was redraped with sterile drapes.  All equipment was exchanged for clean equipment.  Gloves and gowns of all scrubbed participants were changed.  The wounds were then irrigated with sterile saline.    All irrigation of and evacuated. The rectus fascia was then approximated with running #1 PDS suture.  Sponge, needle, and instrument counts were reported correct The skin of all incision sites was closed with 4-0 Monocryl subcuticular sutures.  All counts were then again reported correct. Dermabond was applied to all incisions    An MD assistant was necessary for tissue manipulation, retraction and positioning due to the complexity of the case, obesity of the patient and hospital policies  DISPOSITION: PACU in  satisfactory condition

## 2020-09-06 ENCOUNTER — Other Ambulatory Visit: Payer: Self-pay

## 2020-09-06 ENCOUNTER — Encounter (HOSPITAL_COMMUNITY): Payer: Self-pay | Admitting: Surgery

## 2020-09-06 LAB — CBC
HCT: 40.4 % (ref 39.0–52.0)
Hemoglobin: 13.3 g/dL (ref 13.0–17.0)
MCH: 31.5 pg (ref 26.0–34.0)
MCHC: 32.9 g/dL (ref 30.0–36.0)
MCV: 95.7 fL (ref 80.0–100.0)
Platelets: 310 10*3/uL (ref 150–400)
RBC: 4.22 MIL/uL (ref 4.22–5.81)
RDW: 13.4 % (ref 11.5–15.5)
WBC: 8.6 10*3/uL (ref 4.0–10.5)
nRBC: 0 % (ref 0.0–0.2)

## 2020-09-06 LAB — BASIC METABOLIC PANEL
Anion gap: 11 (ref 5–15)
BUN: 8 mg/dL (ref 8–23)
CO2: 21 mmol/L — ABNORMAL LOW (ref 22–32)
Calcium: 8.5 mg/dL — ABNORMAL LOW (ref 8.9–10.3)
Chloride: 104 mmol/L (ref 98–111)
Creatinine, Ser: 1 mg/dL (ref 0.61–1.24)
GFR, Estimated: >60 mL/min (ref >60)
Glucose, Bld: 136 mg/dL — ABNORMAL HIGH (ref 70–99)
Potassium: 4 mmol/L (ref 3.5–5.1)
Sodium: 136 mmol/L (ref 135–145)

## 2020-09-06 MED ORDER — TRAMADOL HCL 50 MG PO TABS: 50.0000 mg | ORAL_TABLET | Freq: Four times a day (QID) | ORAL | 0 refills | Status: AC | PRN

## 2020-09-06 NOTE — Discharge Instructions (Addendum)
POST OP INSTRUCTIONS AFTER COLON SURGERY  1. DIET: Be sure to include lots of fluids daily to stay hydrated - 64oz of water per day (8, 8 oz glasses).  Avoid fast food or heavy meals for the first couple of weeks as your are more likely to get nauseated. Avoid raw/uncooked fruits or vegetables for the first 4 weeks (its ok to have these if they are blended into smoothie form). If you have fruits/vegetables, make sure they are cooked until soft enough to mash on the roof of your mouth and chew your food well. Otherwise, diet as tolerated.  2. Take your usually prescribed home medications unless otherwise directed.  3. PAIN CONTROL: a. Pain is best controlled by a usual combination of three different methods TOGETHER: i. Ice/Heat ii. Over the counter pain medication iii. Prescription pain medication b. Most patients will experience some swelling and bruising around the surgical site.  Ice packs or heating pads (30-60 minutes up to 6 times a day) will help. Some people prefer to use ice alone, heat alone, alternating between ice & heat.  Experiment to what works for you.  Swelling and bruising can take several weeks to resolve.   c. It is helpful to take an over-the-counter pain medication regularly for the first few weeks: i. Ibuprofen (Motrin/Advil) - 200mg  tabs - take 3 tabs (600mg ) every 6 hours as needed for pain (unless you have been directed previously to avoid NSAIDs/ibuprofen) ii. Acetaminophen (Tylenol) - you may take 650mg  every 6 hours as needed. You can take this with motrin as they act differently on the body. If you are taking a narcotic pain medication that has acetaminophen in it, do not take over the counter tylenol at the same time. iii. NOTE: You may take both of these medications together - most patients  find it most helpful when alternating between the two (i.e. Ibuprofen at 6am, tylenol at 9am, ibuprofen at 12pm ...) d. A  prescription for pain medication should be given to you  upon discharge.  Take your pain medication as prescribed if your pain is not adequatly controlled with the over-the-counter pain reliefs mentioned above.  4. Avoid getting constipated.  Between the surgery and the pain medications, it is common to experience some constipation.  Increasing fluid intake and taking a fiber supplement (such as Metamucil, Citrucel, FiberCon, MiraLax, etc) 1-2 times a day regularly will usually help prevent this problem from occurring.  A mild laxative (prune juice, Milk of Magnesia, MiraLax, etc) should be taken according to package directions if there are no bowel movements after 48 hours.    5. Dressing: Your incisions are covered in Dermabond which is like sterile superglue for the skin. This will come off on it's own in a couple weeks. It is waterproof and you may bathe normally starting the day after your surgery in a shower. Avoid baths/pools/lakes/oceans until your wounds have fully healed.  6. ACTIVITIES as tolerated:   a. Avoid heavy lifting (>10lbs or 1 gallon of milk) for the next 6 weeks. b. You may resume regular daily activities as tolerated--such as daily self-care, walking, climbing stairs--gradually increasing activities as tolerated.  If you can walk 30 minutes without difficulty, it is safe to try more intense activity such as jogging, treadmill, bicycling, low-impact aerobics.  c. DO NOT PUSH THROUGH PAIN.  Let pain be your guide: If it hurts to do something, don't do it. d. Dennis Bast may drive when you are no longer taking prescription pain medication, you  can comfortably wear a seatbelt, and you can safely maneuver your car and apply brakes.  7. FOLLOW UP in our office a. Please call [facility] at [phone] to set up an appointment to see your surgeon in the office for a follow-up appointment approximately 2 weeks after your surgery. b. Make sure that you call for this appointment the day you arrive home to insure a convenient appointment time.  9. If you  have disability or family leave forms that need to be completed, you may have them completed by your primary care physician's office; for return to work instructions, please ask our office staff and they will be happy to assist you in obtaining this documentation   When to call us [phone]: 1. Poor pain control 2. Reactions / problems with new medications (rash/itching, etc)  3. Fever over 101.5 F (38.5 C) 4. Inability to urinate 5. Nausea/vomiting 6. Worsening swelling or bruising 7. Continued bleeding from incision. 8. Increased pain, redness, or drainage from the incision  The clinic staff is available to answer your questions during regular business hours (8:30am-5pm).  Please dont hesitate to call and ask to speak to one of our nurses for clinical concerns.   A surgeon from [facility] is always on call at the hospitals   If you have a medical emergency, go to the nearest emergency room or call 911.  [facility], [place] [address] [place], [place], [place]  [address] MAIN: [phone] FAX: [phone] [email or web address]    Colorectal Cancer  Colorectal cancer is a cancerous (malignant) tumor in the colon or rectum, which are parts of the large intestine. A tumor is a mass of cells or tissue. The cancer can spread (metastasize) to other parts of the body. What are the causes? This condition is usually caused by abnormal growths called polyps on the inner wall of the colon or rectum. Left untreated, these polyps can develop into cancer. Other times, abnormal changes to genes (gene mutations) can cause cells to become cancerous. What increases the risk? The following factors may make you more likely to develop this condition:  Being older than age 1.  Having a personal or family history of colorectal cancer or polyps in your colon.  Having diabetes, or having had cancer and cancer treatments such as radiation  before.  Having certain hereditary conditions, such as: ? Lynch syndrome. ? Familial adenomatous polyposis. ? Turcot syndrome. ? Peutz-Jeghers syndrome. ? MUTYH-associated polyposis (MAP).  Being overweight or obese.  Having a diet that is: ? High in red meats, such as beef, pork, lamb, or liver. ? High in precooked, cured, or other processed meat, such as sausages, meat loaves, and hot dogs. ? Low in fiber, such as fiber found in whole grains, fruits, and vegetables.  Being inactive (sedentary), smoking, or drinking too much alcohol.  Having an inflammatory bowel disease, such as ulcerative colitis or Crohn's disease. What are the signs or symptoms? Early colorectal cancer often does not cause symptoms. As the cancer grows, symptoms may include:  Changes in bowel habits.  Feeling like the bowel does not empty completely after a bowel movement.  Stools (feces) that are narrower than usual, or blood in the stool or toilet after a bowel movement. The blood may be bright red or very dark in color.  Diarrhea, constipation, or frequent gas pain.  Anemia, constant tiredness (fatigue), or nausea and vomiting.  Discomfort, pain, bloating, fullness, or cramps in the abdomen.  Unexplained weight loss. How is this diagnosed? This condition may be diagnosed with:  A medical history.  A physical exam.  Tests. These may include: ? An exam of the rectum using a gloved finger (digital rectal exam). ? A stool test called a fecal occult blood test. ? Blood tests. ? A biopsy. This is removal of a tissue sample from the colon or rectum to be looked at under a microscope. You may also have other tests, including:  X-rays, CT scans, MRIs, or a PET scan.  A sigmoidoscopy. This test is done to view the inside of the rectum.  A colonoscopy. This test is done to view the inside of the colon. During this test, small polyps can be removed or biopsies may be taken.  An endorectal  ultrasound. This test checks how deep a tumor in the rectum has grown and whether the cancer has spread to lymph nodes or other nearby tissues. Additional tests may be done to find out whether the cancer has spread to other parts of the body (what stage it is). The stages of cancer include:  Stage 0 - At this stage, the cancer is found only in the innermost lining of the colon or rectum. The tumor has not spread to other tissue.  Stage 1 (I) - At this stage, the cancer has grown into the inner wall (muscle layer) of the colon or rectum.  Stage 2 (II) - At this stage, the cancer has grown more deeply into the wall of the colon or rectum or through the wall. It may have invaded nearby tissue or organs.  Stage 3 (III) - At this stage, the cancer has spread to nearby lymph nodes or tissue near the lymph nodes.  Stage 4 (IV) - At this stage, the cancer has spread to other parts of the body that are not near the colon, such as the liver or lungs. How is this treated? Treatment for this condition depends on the type and stage of the cancer. Treatment may include:  Surgery. In the early stages of the cancer, surgery may be done to remove polyps or small tumors from the colon. In later stages, surgery may be done to remove part of the colon (partial colectomy).  Chemotherapy. This treatment uses medicines to kill cancer cells.  Targeted therapy. This treatment can kill tumor cells by targeting specific gene mutations or proteins that the cancer expresses.  Immunotherapy (biologic therapy). This treatment uses your body's disease-fighting system (immune system) to fight the cancer. Substances made by your body or in a laboratory are used to boost, direct, or restore your body's natural defenses against cancer.  Radiation therapy. This treatment uses radiation to kill cancer cells or shrink tumors.  Radiofrequency ablation. This treatment uses radio waves to destroy the tumors that may have spread to  other areas of the body, such as the liver. Follow these instructions at home:  Take over-the-counter and prescription medicines only as told by your health care provider.  Try to eat regular, healthy meals. Some of your treatments might affect your appetite. Ask to meet with a dietitian if you are having problems eating or with your appetite.  Consider joining a support group. This may help you learn about your diagnosis and manage the stress of having colorectal cancer.  If you are admitted to the hospital, tell your cancer care team.  Keep all follow-up visits. This is important. How is this prevented?  Colorectal cancer can be prevented with screening tests  that find polyps so they can be removed before they develop into cancer.  All adults should have screening for colorectal cancer starting at age 50 and continuing until age 14. Your health care provider may recommend screening before age 2. People at increased risk should start screening at an earlier age.  You may be able to help reduce your risk of developing colorectal cancer by staying at a healthy weight, eating a healthy diet, avoiding tobacco and alcohol use, and being physically active. Where to find more information  American Cancer Society: cancer.[place] cancer.gov Contact a health care provider if:  Your diarrhea or constipation does not go away.  You have blood in your stool or in the toilet after a bowel movement.  Your bowel habits change.  You have increased pain in your abdomen.  You notice new fatigue or weakness.  You lose weight without a known reason. Get help right away if:  You have increased bleeding from the rectum.  You have any uncontrollable or severe abdominal symptoms. Summary  Colorectal cancer is a cancerous (malignant) tumor in the colon or rectum, which are parts of the large intestine.  Common risk factors for this condition include being older than  age 39, having a personal or family history of colorectal cancer or colon polyps, having certain hereditary conditions, or having conditions such as diabetes or inflammatory bowel disease.  This condition may be diagnosed with tests, such as a colonoscopy and biopsy.  Treatment depends on the type and stage of the cancer. Often, treatment includes surgery to remove the abnormal tissue, along with chemotherapy, targeted therapy, or immunotherapy.  Keep all follow-up visits. This is important. This information is not intended to replace advice given to you by your health care provider. Make sure you discuss any questions you have with your health care provider. Document Revised: 10/05/2019 Document Reviewed: 10/05/2019 Elsevier Patient Education  2021 [employer].

## 2020-09-06 NOTE — Anesthesia Postprocedure Evaluation (Signed)
Anesthesia Post Note  Patient: Caleb Smith Vital  Procedure(s) Performed: LAPAROSCOPIC  SEGMENTAL COLECTOMY, TAKE DOWN OF SPLEENIC FLEXURE     Patient location during evaluation: PACU Anesthesia Type: General Level of consciousness: awake and alert and oriented Pain management: pain level controlled Vital Signs Assessment: post-procedure vital signs reviewed and stable Respiratory status: spontaneous breathing, nonlabored ventilation and respiratory function stable Cardiovascular status: blood pressure returned to baseline Postop Assessment: no apparent nausea or vomiting Anesthetic complications: no   No complications documented.               Brennan Bailey

## 2020-09-06 NOTE — Progress Notes (Signed)
Subjective No acute events. Feeling well. Pain well controlled. Has drink 5 glasses of water/shasta without n/v. Denies flatus/bm yet. Up walking around this morning  Objective: Vital signs in last 24 hours: Temp:  [97.4 F (36.3 C)-98.7 F (37.1 C)] 97.6 F (36.4 C) (03/10 0411) Pulse Rate:  [58-74] 58 (03/10 0411) Resp:  [8-21] 18 (03/10 0411) BP: (116-150)/(68-90) 127/68 (03/10 0411) SpO2:  [95 %-99 %] 97 % (03/10 0411) Weight:  [92.5 kg] 92.5 kg (03/09 1017) Last BM Date: 09/05/20  Intake/Output from previous day: 03/09 0701 - 03/10 0700 In: 1816.2 [I.V.:1816.2] Out: 2045 [Urine:2025; Blood:20] Intake/Output this shift: No intake/output data recorded.  Gen: NAD, comfortable CV: RRR Pulm: Normal work of breathing Abd: Soft, minimally tender around port sites. Incisions c/d/i. Not significantly distended. Ext: SCDs in place  Lab Results: CBC  Recent Labs    09/06/20 0505  WBC 8.6  HGB 13.3  HCT 40.4  PLT 310   BMET Recent Labs    09/06/20 0505  NA 136  K 4.0  CL 104  CO2 21*  GLUCOSE 136*  BUN 8  CREATININE 1.00  CALCIUM 8.5*   PT/INR No results for input(s): LABPROT, INR in the last 72 hours. ABG No results for input(s): PHART, HCO3 in the last 72 hours.  Invalid input(s): PCO2, PO2  Studies/Results:  Anti-infectives: Anti-infectives (From admission, onward)   Start     Dose/Rate Route Frequency Ordered Stop   09/05/20 1400  neomycin (MYCIFRADIN) tablet 1,000 mg  Status:  Discontinued       "And" Linked Group Details   1,000 mg Oral 3 times per day 09/05/20 1004 09/05/20 1028   09/05/20 1400  metroNIDAZOLE (FLAGYL) tablet 1,000 mg  Status:  Discontinued       "And" Linked Group Details   1,000 mg Oral 3 times per day 09/05/20 1004 09/05/20 1028   09/05/20 1015  cefoTEtan (CEFOTAN) 2 g in sodium chloride 0.9 % 100 mL IVPB        2 g 200 mL/hr over 30 Minutes Intravenous On call to O.R. 09/05/20 1004 09/05/20 1258        Assessment/Plan: Patient Active Problem List   Diagnosis Date Noted  . S/P laparoscopic-assisted sigmoidectomy 09/05/2020  . Adenocarcinoma of colon ([place]) 06/21/2020  . Adenomatous polyp of descending colon 05/14/2020  . History of colonic polyps 05/14/2020  . Abnormal colonoscopy 05/14/2020  . Occult blood in stools   . Polyp of colon   . Alcohol use 11/25/2019  . Arthritis of right knee 08/09/2019  . Advanced care planning/counseling discussion 11/13/2017  . Coronary artery calcification seen on CAT scan 08/21/2017  . Atherosclerosis of aorta ([place]) 07/03/2017  . Lung nodule, solitary 06/28/2017  . Pedal edema 11/05/2016  . Upper airway cough syndrome 06/26/2016  . Plantar fasciitis, right 10/30/2015  . Health maintenance examination 10/27/2014  . Lesion of nose 07/14/2014  . Obesity, Class I, BMI 30-34.9 07/14/2014  . Hypothyroidism   . HLD (hyperlipidemia)   . GERD (gastroesophageal reflux disease)    s/p Procedure(s): LAPAROSCOPIC  SEGMENTAL COLECTOMY, TAKE DOWN OF SPLEENIC FLEXURE 09/05/2020  -We spent time reviewing his procedure, findings, expectations moving forward and plans. Answered all related questions -Advance to full liquids; adat; d/c ivf -Awaiting return of bowel fxn -Ambulate 5x/day -PPx: SQH, SCDs    LOS: 1 day   Nadeen Landau, MD [facility] Use [email or web address] to contact on call provider

## 2020-09-07 LAB — BASIC METABOLIC PANEL
Anion gap: 9 (ref 5–15)
BUN: 8 mg/dL (ref 8–23)
CO2: 24 mmol/L (ref 22–32)
Calcium: 8.8 mg/dL — ABNORMAL LOW (ref 8.9–10.3)
Chloride: 105 mmol/L (ref 98–111)
Creatinine, Ser: 1 mg/dL (ref 0.61–1.24)
GFR, Estimated: >60 mL/min (ref >60)
Glucose, Bld: 144 mg/dL — ABNORMAL HIGH (ref 70–99)
Potassium: 4 mmol/L (ref 3.5–5.1)
Sodium: 138 mmol/L (ref 135–145)

## 2020-09-07 LAB — CBC
HCT: 40.7 % (ref 39.0–52.0)
Hemoglobin: 13.5 g/dL (ref 13.0–17.0)
MCH: 31.5 pg (ref 26.0–34.0)
MCHC: 33.2 g/dL (ref 30.0–36.0)
MCV: 94.9 fL (ref 80.0–100.0)
Platelets: 316 10*3/uL (ref 150–400)
RBC: 4.29 MIL/uL (ref 4.22–5.81)
RDW: 13.9 % (ref 11.5–15.5)
WBC: 8.8 10*3/uL (ref 4.0–10.5)
nRBC: 0 % (ref 0.0–0.2)

## 2020-09-07 NOTE — Progress Notes (Signed)
Subjective Having some bloating and nausea. Pain well controlled. Having flatus and small bm's. Up walking around this morning  Objective: Vital signs in last 24 hours: Temp:  [97.8 F (36.6 C)-98.2 F (36.8 C)] 97.8 F (36.6 C) (03/11 0617) Pulse Rate:  [54-81] 64 (03/11 0617) Resp:  [18] 18 (03/11 0617) BP: (128-167)/(65-92) 148/90 (03/11 0617) SpO2:  [92 %-96 %] 95 % (03/11 0617) Weight:  [88.8 kg] 88.8 kg (03/11 0500) Last BM Date: 09/06/20  Intake/Output from previous day: 03/10 0701 - 03/11 0700 In: 960 [P.O.:960] Out: 825 [Urine:825] Intake/Output this shift: No intake/output data recorded.  Gen: NAD, comfortable CV: RRR Pulm: Normal work of breathing Abd: Soft, minimally tender around port sites. Incisions c/d/i. Minimally distended. Ext: SCDs in place  Lab Results: CBC  Recent Labs    09/06/20 0505 09/07/20 0513  WBC 8.6 8.8  HGB 13.3 13.5  HCT 40.4 40.7  PLT 310 316   BMET Recent Labs    09/06/20 0505 09/07/20 0513  NA 136 138  K 4.0 4.0  CL 104 105  CO2 21* 24  GLUCOSE 136* 144*  BUN 8 8  CREATININE 1.00 1.00  CALCIUM 8.5* 8.8*   PT/INR No results for input(s): LABPROT, INR in the last 72 hours. ABG No results for input(s): PHART, HCO3 in the last 72 hours.  Invalid input(s): PCO2, PO2  Studies/Results:  Anti-infectives: Anti-infectives (From admission, onward)   Start     Dose/Rate Route Frequency Ordered Stop   09/05/20 1400  neomycin (MYCIFRADIN) tablet 1,000 mg  Status:  Discontinued       "And" Linked Group Details   1,000 mg Oral 3 times per day 09/05/20 1004 09/05/20 1028   09/05/20 1400  metroNIDAZOLE (FLAGYL) tablet 1,000 mg  Status:  Discontinued       "And" Linked Group Details   1,000 mg Oral 3 times per day 09/05/20 1004 09/05/20 1028   09/05/20 1015  cefoTEtan (CEFOTAN) 2 g in sodium chloride 0.9 % 100 mL IVPB        2 g 200 mL/hr over 30 Minutes Intravenous On call to O.R. 09/05/20 1004 09/05/20 1258        Assessment/Plan: Patient Active Problem List   Diagnosis Date Noted  . S/P laparoscopic-assisted sigmoidectomy 09/05/2020  . Adenocarcinoma of colon ([place]) 06/21/2020  . Adenomatous polyp of descending colon 05/14/2020  . History of colonic polyps 05/14/2020  . Abnormal colonoscopy 05/14/2020  . Occult blood in stools   . Polyp of colon   . Alcohol use 11/25/2019  . Arthritis of right knee 08/09/2019  . Advanced care planning/counseling discussion 11/13/2017  . Coronary artery calcification seen on CAT scan 08/21/2017  . Atherosclerosis of aorta ([place]) 07/03/2017  . Lung nodule, solitary 06/28/2017  . Pedal edema 11/05/2016  . Upper airway cough syndrome 06/26/2016  . Plantar fasciitis, right 10/30/2015  . Health maintenance examination 10/27/2014  . Lesion of nose 07/14/2014  . Obesity, Class I, BMI 30-34.9 07/14/2014  . Hypothyroidism   . HLD (hyperlipidemia)   . GERD (gastroesophageal reflux disease)    s/p Procedure(s): LAPAROSCOPIC  SEGMENTAL COLECTOMY, TAKE DOWN OF SPLEENIC FLEXURE 09/05/2020  - Cont soft diet -Ambulate 5x/day - Probable d/c tomorrow as long as bloating and nausea improve -PPx: SQH, SCDs    LOS: 2 days   Rosario Adie, MD  [facility]

## 2020-09-08 LAB — CBC
HCT: 46.2 % (ref 39.0–52.0)
Hemoglobin: 14.5 g/dL (ref 13.0–17.0)
MCH: 31.5 pg (ref 26.0–34.0)
MCHC: 31.4 g/dL (ref 30.0–36.0)
MCV: 100.2 fL — ABNORMAL HIGH (ref 80.0–100.0)
Platelets: 317 10*3/uL (ref 150–400)
RBC: 4.61 MIL/uL (ref 4.22–5.81)
RDW: 13.9 % (ref 11.5–15.5)
WBC: 9.4 10*3/uL (ref 4.0–10.5)
nRBC: 0 % (ref 0.0–0.2)

## 2020-09-08 LAB — COMPREHENSIVE METABOLIC PANEL
ALT: 38 U/L (ref 0–44)
AST: 28 U/L (ref 15–41)
Albumin: 3.8 g/dL (ref 3.5–5.0)
Alkaline Phosphatase: 49 U/L (ref 38–126)
Anion gap: 10 (ref 5–15)
BUN: 9 mg/dL (ref 8–23)
CO2: 26 mmol/L (ref 22–32)
Calcium: 9.2 mg/dL (ref 8.9–10.3)
Chloride: 104 mmol/L (ref 98–111)
Creatinine, Ser: 1.05 mg/dL (ref 0.61–1.24)
GFR, Estimated: >60 mL/min (ref >60)
Glucose, Bld: 107 mg/dL — ABNORMAL HIGH (ref 70–99)
Potassium: 4.4 mmol/L (ref 3.5–5.1)
Sodium: 140 mmol/L (ref 135–145)
Total Bilirubin: 0.9 mg/dL (ref 0.3–1.2)
Total Protein: 7.2 g/dL (ref 6.5–8.1)

## 2020-09-08 NOTE — Discharge Summary (Signed)
Physician Discharge Summary    Patient ID: Caleb Smith MRN: [id number] DOB/AGE: 1950/09/05  70 y.o.  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 Laproscopic segmental colectomy 2. Takedown of splenic flexure 3. Flexible sigmoidoscopy 4. Bilateral transversus abdominus plane blocks  SURGEON: Sharon Mt. White, MD  SURGICAL PATHOLOGY  CASE: [id number]  PATIENT: Caleb Smith  Surgical Pathology Report      Clinical History: Colon cancer (crm)      FINAL MICROSCOPIC DIAGNOSIS:   A. COLON, DISTAL TRANSVERSE, RESECTION:  - Adenocarcinoma, well differentiated, 1.5 cm  - No carcinoma identified in seventeen lymph nodes (0/17)  - Margins uninvolved by carcinoma  - See oncology table and comment below   B. DISTAL DONUT:  - No carcinoma identified   C. PROXIMAL DONUT:  - No carcinoma identified   ONCOLOGY TABLE: Pathologic Stage Classification (pTNM, AJCC 8th Edition): pT2, pN0   COLON AND RECTUM, CARCINOMA: Resection, Including Transanal Disk  Excision of Rectal Neoplasms    Margins:    Margin Status for Invasive Carcinoma: All margins negative for  invasive carcinoma  Regional Lymph Nodes:    Number of Lymph Nodes with Tumor: 0    Number  of Lymph Nodes Examined: 17  Tumor Deposits: Not identified  Distant Metastasis:    Distant Site(s) Involved: N/A    Consults: None  Hospital Course:   The patient underwent the surgery above.  Postoperatively, the patient gradually mobilized and advanced to a solid diet.  Pain and other symptoms were treated aggressively.    By the time of discharge, the patient was walking well the hallways, eating food, having flatus.  Pain was well-controlled on an oral medications.  Based on meeting discharge criteria and continuing to recover, I felt it was safe for the patient to be discharged from the hospital to further recover with close followup. Postoperative recommendations were discussed in detail.  They are written as well.  Discharged Condition: good  Discharge Exam: Blood pressure 140/85, pulse 72, temperature 98.1 F (36.7 C), temperature source Oral, resp. rate 16, height 5' 7"  (1.702 m), weight 88.8 kg, SpO2 93 %.  General: Pt awake/alert/oriented x4 in No acute distress Eyes: PERRL, normal EOM.  Sclera clear.  No icterus Neuro: CN II-XII intact w/o focal sensory/motor deficits. Lymph: No head/neck/groin lymphadenopathy Psych:  No delerium/psychosis/paranoia HENT: Normocephalic, Mucus membranes moist.  No thrush Neck: Supple, No tracheal deviation Chest: No chest wall pain w good excursion CV:  Pulses intact.  Regular rhythm MS: Normal AROM mjr joints.  No obvious deformity Abdomen: Soft.  Nondistended.  Mildly tender at incisions only.  No evidence of peritonitis.  No incarcerated hernias. Ext:  SCDs BLE.  No mjr edema.  No cyanosis Skin: No petechiae / purpura   Disposition:    Follow-up Information    Nadeen Landau  M, MD. Schedule an appointment as soon as possible for a visit in 3 week(s).   Specialties: General Surgery, Colon and Rectal Surgery Contact information: [place] [address] [phone]               Discharge  disposition: 01-Home or Self Care       Discharge Instructions    Call MD for:   Complete by: As directed    FEVER > 101.5 F  (temperatures < 101.5 F are not significant)   Call MD for:  extreme fatigue   Complete by: As directed    Call MD for:  persistant dizziness or light-headedness   Complete by: As directed    Call MD for:  persistant nausea and vomiting   Complete by: As directed    Call MD for:  redness, tenderness, or signs of infection (pain, swelling, redness, odor or green/yellow discharge around incision site)   Complete by: As directed    Call MD for:  severe uncontrolled pain   Complete by: As directed    Diet - low sodium heart healthy   Complete by: As directed    Start with a bland diet such as soups, liquids, starchy foods, low fat foods, etc. the first few days at home. Gradually advance to a solid, low-fat, high fiber diet by the end of the first week at home.   Add a fiber supplement to your diet (Metamucil, etc) If you feel full, bloated, or constipated, stay on a full liquid or pureed/blenderized diet for a few days until you feel better and are no longer constipated.   Discharge instructions   Complete by: As directed    See Discharge Instructions If you are not getting better after two weeks or are noticing you are getting worse, contact our office [phone] for further advice.  We may need to adjust your medications, re-evaluate you in the office, send you to the emergency room, or see what other things we can do to help. The clinic staff is available to answer your questions during regular business hours (8:30am-5pm).  Please don't hesitate to call and ask to speak to one of our nurses for clinical concerns.    A surgeon from [facility] is always on call at the hospitals 24 hours/day If you have a medical emergency, go to the nearest emergency room or call 911.   Discharge wound care:   Complete by: As directed    It is good for  closed incisions and even open wounds to be washed every day.  Shower every day.  Short baths are fine.  Wash the incisions and wounds clean with soap & water.    You may leave closed incisions open to air if it is dry.   You may cover the incision with clean gauze & replace it after your daily shower for comfort.  DERMABOND:  You have purple skin glue (Dermabond) on your incision(s).  Leave them in place, and they will fall off on their own like a scab in 2-3 weeks.  You may trim any edges that curl up with clean scissors.   Driving Restrictions   Complete by: As directed    You may drive when: - you are no longer taking narcotic prescription pain medication - you can comfortably wear a seatbelt - you can safely make sudden turns/stops without pain.   Increase activity slowly   Complete by: As directed    Start light daily  activities --- self-care, walking, climbing stairs- beginning the day after surgery.  Gradually increase activities as tolerated.  Control your pain to be active.  Stop when you are tired.  Ideally, walk several times a day, eventually an hour a day.   Most people are back to most day-to-day activities in a few weeks.  It takes 4-6 weeks to get back to unrestricted, intense activity. If you can walk 30 minutes without difficulty, it is safe to try more intense activity such as jogging, treadmill, bicycling, low-impact aerobics, swimming, etc. Save the most intensive and strenuous activity for last (Usually 4-8 weeks after surgery) such as sit-ups, heavy lifting, contact sports, etc.  Refrain from any intense heavy lifting or straining until you are off narcotics for pain control.  You will have off days, but things should improve week-by-week. DO NOT PUSH THROUGH PAIN.  Let pain be your guide: If it hurts to do something, don't do it.   Lifting restrictions   Complete by: As directed    If you can walk 30 minutes without difficulty, it is safe to try more intense activity such  as jogging, treadmill, bicycling, low-impact aerobics, swimming, etc. Save the most intensive and strenuous activity for last (Usually 4-8 weeks after surgery) such as sit-ups, heavy lifting, contact sports, etc.   Refrain from any intense heavy lifting or straining until you are off narcotics for pain control.  You will have off days, but things should improve week-by-week. DO NOT PUSH THROUGH PAIN.  Let pain be your guide: If it hurts to do something, don't do it.  Pain is your body warning you to avoid that activity for another week until the pain goes down.   May shower / Bathe   Complete by: As directed    May walk up steps   Complete by: As directed    Remove dressing in 72 hours   Complete by: As directed    Make sure all dressings are removed by the third day after surgery.  Leave incisions open to air.  OK to cover incisions with gauze or bandages as desired   Sexual Activity Restrictions   Complete by: As directed    You may have sexual intercourse when it is comfortable. If it hurts to do something, stop.      Allergies as of 09/08/2020   No Known Allergies     Medication List    TAKE these medications   amoxicillin 500 MG tablet Commonly known as: AMOXIL Take 2g 1 hour prior to colonoscopy procedure What changed:   how much to take  how to take this  when to take this   Aspirin Low Dose 81 MG chewable tablet Generic drug: aspirin CHEW 1 TABLET (81 MG TOTAL) BY MOUTH DAILY. What changed: how much to take   atorvastatin 80 MG tablet Commonly known as: LIPITOR TAKE 1 TABLET BY MOUTH EVERY DAY   ezetimibe 10 MG tablet Commonly known as: ZETIA TAKE 1 TABLET BY MOUTH EVERY DAY   Fish Oil 1200 MG Caps Take 1,200 mg by mouth daily.   omeprazole 40 MG capsule Commonly known as: PRILOSEC TAKE 1 CAPSULE BY MOUTH EVERY DAY What changed: how much to take   Synthroid 100 MCG tablet Generic drug: levothyroxine TAKE 1 TABLET (100 MCG TOTAL) BY MOUTH DAILY BEFORE  BREAKFAST.   traMADol 50 MG tablet Commonly known as: Ultram Take 1 tablet (50 mg total) by mouth every 6 (six) hours as needed for up to  5 days (postop pain not controlled with tylenol and ibuprofen first).   vitamin B-12 1000 MCG tablet Commonly known as: CYANOCOBALAMIN Take 1,000 mcg by mouth daily.   Vitamin D3 50 MCG (2000 UT) Tabs Take 2,000 Units by mouth daily.            Discharge Care Instructions  (From admission, onward)         Start     Ordered   09/08/20 0000  Discharge wound care:       Comments: It is good for closed incisions and even open wounds to be washed every day.  Shower every day.  Short baths are fine.  Wash the incisions and wounds clean with soap & water.    You may leave closed incisions open to air if it is dry.   You may cover the incision with clean gauze & replace it after your daily shower for comfort.  DERMABOND:  You have purple skin glue (Dermabond) on your incision(s).  Leave them in place, and they will fall off on their own like a scab in 2-3 weeks.  You may trim any edges that curl up with clean scissors.   09/08/20 0930          Significant Diagnostic Studies:   SURGICAL PATHOLOGY  CASE: [id number]  PATIENT: Caleb Smith  Surgical Pathology Report      Clinical History: Colon cancer (crm)      FINAL MICROSCOPIC DIAGNOSIS:   A. COLON, DISTAL TRANSVERSE, RESECTION:  - Adenocarcinoma, well differentiated, 1.5 cm  - No carcinoma identified in seventeen lymph nodes (0/17)  - Margins uninvolved by carcinoma  - See oncology table and comment below   B. DISTAL DONUT:  - No carcinoma identified   C. PROXIMAL DONUT:  - No carcinoma identified   ONCOLOGY TABLE:   COLON AND RECTUM, CARCINOMA: Resection, Including Transanal Disk  Excision of Rectal Neoplasms   Procedure: Segmental colectomy  Tumor Site: Descending colon  Tumor Size: 1.5  Macroscopic Tumor Perforation: Not identified  Histologic Type:  Adenocarcinoma  Histologic Grade: G1, well-differentiated  Multiple Primary Sites: N/A  Tumor Extension: Invades into muscularis propria  Lymphovascular Invasion: Not identified  Perineural Invasion: Not identified  Treatment Effect: No known presurgical therapy  Margins:    Margin Status for Invasive Carcinoma: All margins negative for  invasive carcinoma  Regional Lymph Nodes:    Number of Lymph Nodes with Tumor: 0    Number of Lymph Nodes Examined: 17  Tumor Deposits: Not identified  Distant Metastasis:    Distant Site(s) Involved: N/A  Pathologic Stage Classification (pTNM, AJCC 8th Edition): pT2, pN0  Ancillary Studies: MMR / MSI testing will be ordered.  Representative Tumor Block: A4  Comments: There is prominent foreign body giant cell reaction present  within the bowel wall likely secondary to previous biopsy.  (v4.2.0.1)    GROSS DESCRIPTION:   Specimen A: Colon resection, distal transverse colon and splenic  flexure, received fresh.  Specimen integrity: Intact and unopened.  Specimen length: 21 cm  Mesorectal intactness: Not applicable  Tumor location: Mesenteric wall, proximal half of specimen.  Tumor/lesion size: 1.5 cm in diameter dark red granular, sessile mass  which is up to 0.8 cm thick.  Percent of bowel circumference involved: The tumor/lesion involves  approximately 20% of the circumference. The lumen is stenosed to less  than 1 cm in this area.  Tumor distance to margins:            Proximal:  8 cm            Distal: 11.5 cm            Mesenteric (sigmoid and transverse): 4 cm  Macroscopic extent of tumor invasion: The mass involves but does not  grossly completely transect muscularis.  Total presumed lymph nodes: 17 possible lymph nodes ranging from 0.15-  0.8 cm.  Extramural satellite tumor nodules: None  Mucosal polyp(s): None  Additional findings: None  Block summary:  Block 1 = proximal margin   Block 2 = distal margin  Blocks 3-6 = mass  Block 7 = tissue for molecular testing  Block 8 = 5 possible nodes  Block 9 = 4 nodes  Block 10 = 4 nodes  Block 11 = 4 nodes   Specimen B: Received fresh labeled distal donut is a 1.1 cm in length  and 1.8 cm in diameter ring of tan-pink to dark red smooth, soft mucosa  and underlying fibromuscular and fatty tissue with embedded suture  material. Representative sections are submitted in one block.   Specimen C: Received fresh labeled proximal donut is a 1.6 cm in  diameter and up to 0.8 cm in length ring of tan-pink smooth, soft mucosa  with underlying fibromuscular and fatty tissue with embedded metallic  staples. Representative sections are submitted 1 block.   SW 09/06/2020      Final Diagnosis performed by Thressa Sheller, MD.  Electronically signed  09/07/2020  Technical component performed at [facility]  [address] [place], [place] [address].  Professional component performed at [employer]. [facility],  [place] [address], [place], [place] [address].  Immunohistochemistry Technical component (if applicable) was performed  at [facility]. [address], [place],  [place],  [address].  IMMUNOHISTOCHEMISTRY DISCLAIMER (if applicable):  Some of these immunohistochemical stains may have been developed and the  performance characteristics determine by [facility]. Some  may not have been cleared or approved by the U.S. Food and Drug  Administration. The FDA has determined that such clearance or approval  is not necessary. This test is used for clinical purposes. It should not  be regarded as investigational or for research. This laboratory is  certified under the [place]  (CLIA-88) as qualified to perform high complexity clinical laboratory  testing. The controls stained appropriately.   Results for orders placed or  performed during the hospital encounter of 09/05/20 (from the past 72 hour(s))  Surgical pathology     Status: None   Collection Time: 09/05/20  2:50 PM  Result Value Ref Range   SURGICAL PATHOLOGY      SURGICAL PATHOLOGY CASE: [id number] PATIENT: Caleb Smith Surgical Pathology Report     Clinical History: Colon cancer (crm)     FINAL MICROSCOPIC DIAGNOSIS:  A. COLON, DISTAL TRANSVERSE, RESECTION: -  Adenocarcinoma, well differentiated, 1.5 cm -  No carcinoma identified in seventeen lymph nodes (0/17) -  Margins uninvolved by carcinoma -  See oncology table and comment below  B. DISTAL DONUT: -  No carcinoma identified  C. PROXIMAL DONUT: -  No carcinoma identified  ONCOLOGY TABLE:  COLON AND RECTUM, CARCINOMA:  Resection, Including Transanal Disk Excision of Rectal Neoplasms  Procedure: Segmental colectomy Tumor Site: Descending colon Tumor Size: 1.5 Macroscopic Tumor Perforation: Not identified Histologic Type: Adenocarcinoma Histologic Grade: G1, well-differentiated Multiple Primary Sites: N/A Tumor Extension: Invades into muscularis propria Lymphovascular Invasion: Not identified Perineural Invasion: Not identified  Treatment Effect: No  known presurgical therapy Margins:      Margin Status for Invasive Carcinoma: All margins negative for invasive carcinoma Regional Lymph Nodes:      Number of Lymph Nodes with Tumor: 0      Number of Lymph Nodes Examined: 17 Tumor Deposits: Not identified Distant Metastasis:      Distant Site(s) Involved: N/A Pathologic Stage Classification (pTNM, AJCC 8th Edition): pT2, pN0 Ancillary Studies: MMR / MSI testing will be ordered. Representative Tumor Block: A4 Comments: There is prominent foreign body giant cell reaction present within the bowel wall likely secondary to previous biopsy. (v4.2.0.1)   GROSS DESCRIPTION:  Specimen A: Colon resection, distal transverse colon and splenic flexure, received  fresh. Specimen integrity: Intact and unopened. Specimen length: 21 cm Mesorectal intactness: Not applicable Tumor location: Mesenteric wall, proximal half of specimen. Tumor/lesion size: 1.5 cm in diameter dark red granular, sessile mass which is up to 0.8 cm thic k. Percent of bowel circumference involved: The tumor/lesion involves approximately 20% of the circumference.  The lumen is stenosed to less than 1 cm in this area. Tumor distance to margins:                      Proximal: 8 cm                      Distal: 11.5 cm                      Mesenteric (sigmoid and transverse): 4 cm Macroscopic extent of tumor invasion: The mass involves but does not grossly completely transect muscularis. Total presumed lymph nodes: 17 possible lymph nodes ranging from 0.15- 0.8 cm. Extramural satellite tumor nodules: None Mucosal polyp(s): None Additional findings: None Block summary: Block 1 = proximal margin Block 2 = distal margin Blocks 3-6 = mass Block 7 = tissue for molecular testing Block 8 = 5 possible nodes Block 9 = 4 nodes Block 10 = 4 nodes Block 11 = 4 nodes  Specimen B: Received fresh labeled distal donut is a 1.1 cm in length and 1.8 cm in diameter ring of tan-pink to dark red smooth, soft mucosa and underlying fibr omuscular and fatty tissue with embedded suture material.  Representative sections are submitted in one block.  Specimen C: Received fresh labeled proximal donut is a 1.6 cm in diameter and up to 0.8 cm in length ring of tan-pink smooth, soft mucosa with underlying fibromuscular and fatty tissue with embedded metallic staples.  Representative sections are submitted 1 block.  SW 09/06/2020     Final Diagnosis performed by Thressa Sheller, MD.   Electronically signed 09/07/2020 Technical component performed at [facility] [address], [place], [place] [address].  Professional component performed at [employer]. [facility], [place] [address] [place], [place] [address].  Immunohistochemistry Technical component (if applicable) was performed at [facility]. [address], [place], [place], [place] [address].   IMMUNOHISTOCHEMISTRY DISCLAIMER (if applicable): Some of these immunohistochemical stains may have been developed and t he performance characteristics determine by [facility]. Some may not have been cleared or approved by the U.S. Food and Drug Administration. The FDA has determined that such clearance or approval is not necessary. This test is used for clinical purposes. It should not be regarded as investigational or for research. This laboratory is certified under the [place] (CLIA-88) as  qualified to perform high complexity clinical laboratory testing.  The controls stained appropriately.   Basic metabolic panel     Status: Abnormal   Collection Time: 09/06/20  5:05 AM  Result Value Ref Range   Sodium 136 135 - 145 mmol/L   Potassium 4.0 3.5 - 5.1 mmol/L   Chloride 104 98 - 111 mmol/L   CO2 21 (L) 22 - 32 mmol/L   Glucose, Bld 136 (H) 70 - 99 mg/dL    Comment: Glucose reference range applies only to samples taken after fasting for at least 8 hours.   BUN 8 8 - 23 mg/dL   Creatinine, Ser 1.00 0.61 - 1.24 mg/dL   Calcium 8.5 (L) 8.9 - 10.3 mg/dL   GFR, Estimated >60 >60 mL/min    Comment: (NOTE) Calculated using the CKD-EPI Creatinine Equation (2021)    Anion gap 11 5 - 15    Comment: Performed at [facility], [place] [address] [place], [place] [address]  CBC     Status: None   Collection Time: 09/06/20  5:05 AM  Result Value Ref Range   WBC 8.6 4.0 - 10.5 K/uL   RBC 4.22 4.22 - 5.81 MIL/uL   Hemoglobin 13.3 13.0 - 17.0 g/dL   HCT 40.4 39.0 - 52.0 %   MCV 95.7 80.0 - 100.0 fL   MCH 31.5 26.0 - 34.0 pg   MCHC 32.9 30.0 - 36.0 g/dL   RDW 13.4 11.5 - 15.5 %   Platelets 310 150 - 400 K/uL    nRBC 0.0 0.0 - 0.2 %    Comment: Performed at [facility], [place] [address], [place], [place] [address]  Basic metabolic panel     Status: Abnormal   Collection Time: 09/07/20  5:13 AM  Result Value Ref Range   Sodium 138 135 - 145 mmol/L   Potassium 4.0 3.5 - 5.1 mmol/L   Chloride 105 98 - 111 mmol/L   CO2 24 22 - 32 mmol/L   Glucose, Bld 144 (H) 70 - 99 mg/dL    Comment: Glucose reference range applies only to samples taken after fasting for at least 8 hours.   BUN 8 8 - 23 mg/dL   Creatinine, Ser 1.00 0.61 - 1.24 mg/dL   Calcium 8.8 (L) 8.9 - 10.3 mg/dL   GFR, Estimated >60 >60 mL/min    Comment: (NOTE) Calculated using the CKD-EPI Creatinine Equation (2021)    Anion gap 9 5 - 15    Comment: Performed at [facility], [place] [address], [place], [place] [address]  CBC     Status: None   Collection Time: 09/07/20  5:13 AM  Result Value Ref Range   WBC 8.8 4.0 - 10.5 K/uL   RBC 4.29 4.22 - 5.81 MIL/uL   Hemoglobin 13.5 13.0 - 17.0 g/dL   HCT 40.7 39.0 - 52.0 %   MCV 94.9 80.0 - 100.0 fL   MCH 31.5 26.0 - 34.0 pg   MCHC 33.2 30.0 - 36.0 g/dL   RDW 13.9 11.5 - 15.5 %   Platelets 316 150 - 400 K/uL   nRBC 0.0 0.0 - 0.2 %    Comment: Performed at [facility], [place] [address], [place], [place] [address]  CBC     Status: Abnormal   Collection Time: 09/08/20  6:04 AM  Result Value Ref Range   WBC 9.4 4.0 - 10.5 K/uL   RBC 4.61 4.22 - 5.81 MIL/uL  Hemoglobin 14.5 13.0 - 17.0 g/dL   HCT 46.2 39.0 - 52.0 %   MCV 100.2 (H) 80.0 - 100.0 fL   MCH 31.5 26.0 - 34.0 pg   MCHC 31.4 30.0 - 36.0 g/dL   RDW 13.9 11.5 - 15.5 %   Platelets 317 150 - 400 K/uL   nRBC 0.0 0.0 - 0.2 %    Comment: Performed at [facility], [place] [address], [place], [place] [address]  Comprehensive metabolic panel     Status: Abnormal   Collection Time: 09/08/20  7:58 AM  Result Value Ref Range   Sodium 140 135 - 145 mmol/L   Potassium  4.4 3.5 - 5.1 mmol/L   Chloride 104 98 - 111 mmol/L   CO2 26 22 - 32 mmol/L   Glucose, Bld 107 (H) 70 - 99 mg/dL    Comment: Glucose reference range applies only to samples taken after fasting for at least 8 hours.   BUN 9 8 - 23 mg/dL   Creatinine, Ser 1.05 0.61 - 1.24 mg/dL   Calcium 9.2 8.9 - 10.3 mg/dL   Total Protein 7.2 6.5 - 8.1 g/dL   Albumin 3.8 3.5 - 5.0 g/dL   AST 28 15 - 41 U/L   ALT 38 0 - 44 U/L   Alkaline Phosphatase 49 38 - 126 U/L   Total Bilirubin 0.9 0.3 - 1.2 mg/dL   GFR, Estimated >60 >60 mL/min    Comment: (NOTE) Calculated using the CKD-EPI Creatinine Equation (2021)    Anion gap 10 5 - 15    Comment: Performed at [facility], [place] [address] [place], [place] [address]    No results found.  Past Medical History:  Diagnosis Date  . Arthritis   . Colon cancer ([place])   . Coronary artery calcification seen on CAT scan 08/21/2017  . GERD (gastroesophageal reflux disease) 1995   s/p esoph dilation for stricture  . HLD (hyperlipidemia)   . Hypothyroidism   . Obesity 07/14/2014    Past Surgical History:  Procedure Laterality Date  . CARDIOVASCULAR STRESS TEST  07/2017   low risk study ([place])  . COLON RESECTION  09/05/2020   Procedure: LAPAROSCOPIC  SEGMENTAL COLECTOMY, TAKE DOWN OF SPLEENIC FLEXURE;  Surgeon: Ileana Roup, MD;  Location: [facility];  Service: General;;  . COLONOSCOPY  03/2003   small sigmoid polyp, rpt 5 yrs [name])  . COLONOSCOPY WITH PROPOFOL N/A 02/14/2020   SSP (Bonna Gains, Lennette Bihari, MD)  . COLONOSCOPY WITH PROPOFOL N/A 06/07/2020   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 (Mansouraty, Telford Nab., MD)  . ENDOSCOPIC MUCOSAL RESECTION N/A 06/07/2020   Procedure: ENDOSCOPIC MUCOSAL RESECTION;  Surgeon: Rush Landmark Telford Nab., MD;  Location: [place];  Service: Gastroenterology;  Laterality: N/A;  . ESOPHAGOGASTRODUODENOSCOPY  1995   s/p esophageal dilation  . FACIAL RECONSTRUCTION  SURGERY  1990s   cow headbutted him  . FINGER SURGERY Left teenager   reattachment of 2nd,3rd,4th  . HEMOSTASIS CLIP PLACEMENT  06/07/2020   Procedure: HEMOSTASIS CLIP PLACEMENT;  Surgeon: Irving Copas., MD;  Location: [place];  Service: Gastroenterology;;  . POLYPECTOMY  06/07/2020   Procedure: POLYPECTOMY;  Surgeon: Irving Copas., MD;  Location: [place];  Service: Gastroenterology;;  . Lia Foyer LIFTING INJECTION  06/07/2020   Procedure: SUBMUCOSAL LIFTING INJECTION;  Surgeon: Irving Copas., MD;  Location: [place];  Service: Gastroenterology;;  . TONSILLECTOMY    . 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  TOTAL KNEE ARTHROPLASTY Right 08/09/2019   Procedure: RIGHT TOTAL KNEE ARTHROPLASTY-CEMENTED;  Surgeon: Meredith Pel, MD;  Location: [place];  Service: Orthopedics;  Laterality: Right;    Social History   Socioeconomic History  . Marital status: Married    Spouse name: Not on file  . Number of children: Not on file  . Years of education: Not on file  . Highest education level: Not on file  Occupational History  . Not on file  Tobacco Use  . Smoking status: Never Smoker  . Smokeless tobacco: Former Systems developer    Types: Secondary school teacher  . Vaping Use: Never used  Substance and Sexual Activity  . Alcohol use: Yes    Alcohol/week: 0.0 standard drinks    Comment: 2-3 beer daily  . Drug use: No  . Sexual activity: Not on file  Other Topics Concern  . Not on file  Social History Narrative   Lives with wife   Grown children (daughter is Lendon Collar)   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[employer]   Edu: HS   Activity: active on farm   Diet: good water, fruits/vegetables daily   Social Determinants of Radio broadcast assistant Strain: Not on file  Food Insecurity: Not on file  Transportation Needs: Not on file  Physical Activity: Not on file  Stress: Not on file  Social Connections: Not on file  Intimate Partner  Violence: Not on file    Family History  Problem Relation Age of Onset  . Cancer Mother        breast  . Pancreatic cancer Mother   . Liver cancer Mother   . CAD Maternal Uncle 71       massive MI  . Suicidality Father        suicide  . Stroke Neg Hx   . Diabetes Neg Hx   . Hypertension Neg Hx   . Colon cancer Neg Hx   . Esophageal cancer Neg Hx   . Inflammatory bowel disease Neg Hx   . Rectal cancer Neg Hx   . Stomach cancer Neg Hx     Current Facility-Administered Medications  Medication Dose Route Frequency Provider Last Rate Last Admin  . acetaminophen (TYLENOL) tablet 1,000 mg  1,000 mg Oral Q6H Ileana Roup, MD   1,000 mg at 09/08/20 0514  . alum & mag hydroxide-simeth (MAALOX/MYLANTA) 200-200-20 MG/5ML suspension 30 mL  30 mL Oral Q6H PRN Ileana Roup, MD      . atorvastatin (LIPITOR) tablet 80 mg  80 mg Oral Daily Ileana Roup, MD   80 mg at 09/07/20 0932  . diphenhydrAMINE (BENADRYL) 12.5 MG/5ML elixir 12.5 mg  12.5 mg Oral Q6H PRN Ileana Roup, MD       Or  . diphenhydrAMINE (BENADRYL) injection 12.5 mg  12.5 mg Intravenous Q6H PRN Ileana Roup, MD      . ezetimibe (ZETIA) tablet 10 mg  10 mg Oral Daily Ileana Roup, MD   10 mg at 09/07/20 6712  . feeding supplement (ENSURE SURGERY) liquid 237 mL  237 mL Oral BID BM Ileana Roup, MD   237 mL at 09/07/20 1450  . heparin injection 5,000 Units  5,000 Units Subcutaneous Q8H Ileana Roup, MD   5,000 Units at 09/08/20 0515  . hydrALAZINE (APRESOLINE) injection 10 mg  10 mg Intravenous Q2H PRN Ileana Roup, MD      . HYDROmorphone (DILAUDID) injection 0.5 mg  0.5  mg Intravenous Q3H PRN Ileana Roup, MD      . ibuprofen (ADVIL) tablet 600 mg  600 mg Oral Q6H PRN Ileana Roup, MD   600 mg at 09/06/20 0604  . levothyroxine (SYNTHROID) tablet 100 mcg  100 mcg Oral QAC breakfast Ileana Roup, MD   100 mcg at 09/08/20 0515  . ondansetron  (ZOFRAN) tablet 4 mg  4 mg Oral Q6H PRN Ileana Roup, MD   4 mg at 09/06/20 2054   Or  . ondansetron (ZOFRAN) injection 4 mg  4 mg Intravenous Q6H PRN Ileana Roup, MD      . pantoprazole (PROTONIX) EC tablet 40 mg  40 mg Oral Daily Ileana Roup, MD   40 mg at 09/07/20 4584  . simethicone (MYLICON) chewable tablet 40 mg  40 mg Oral Q6H PRN Ileana Roup, MD      . traMADol Veatrice Bourbon) tablet 50 mg  50 mg Oral Q6H PRN Ileana Roup, MD   50 mg at 09/05/20 1908     No Known Allergies  Signed: Morton Peters, MD, FACS, MASCRS  Gastrointestinal and Minimally Invasive Surgery  [facility] [address] [place], [place] [address] [phone] Fax [phone] Main/Paging  CONTACT INFORMATION: Weekday (9AM-5PM) concerns: Call [facility] main office at [phone] Weeknight (5PM-9AM) or Weekend/Holiday concerns: Check [email or web address] for General Surgery [facility] coverage (Please, do not use SecureChat as it is not reliable communication to operating surgeons for immediate patient care)      09/08/2020, 9:30 AM

## 2020-09-08 NOTE — Progress Notes (Signed)
Assessment unchanged. Verbalized understanding of dc instructions through teach back. Discharged via wc to front entrance accompanied by NT and wife.

## 2020-09-11 LAB — SURGICAL PATHOLOGY

## 2020-09-21 DIAGNOSIS — H2511 Age-related nuclear cataract, right eye: Secondary | ICD-10-CM | POA: Diagnosis not present

## 2020-11-18 ENCOUNTER — Other Ambulatory Visit: Payer: Self-pay | Admitting: Family Medicine

## 2020-11-22 ENCOUNTER — Other Ambulatory Visit: Payer: Self-pay | Admitting: Family Medicine

## 2020-11-22 DIAGNOSIS — Z125 Encounter for screening for malignant neoplasm of prostate: Secondary | ICD-10-CM

## 2020-11-22 DIAGNOSIS — Z9049 Acquired absence of other specified parts of digestive tract: Secondary | ICD-10-CM

## 2020-11-22 DIAGNOSIS — D7589 Other specified diseases of blood and blood-forming organs: Secondary | ICD-10-CM

## 2020-11-22 DIAGNOSIS — E039 Hypothyroidism, unspecified: Secondary | ICD-10-CM

## 2020-11-22 DIAGNOSIS — R6889 Other general symptoms and signs: Secondary | ICD-10-CM

## 2020-11-22 DIAGNOSIS — E7800 Pure hypercholesterolemia, unspecified: Secondary | ICD-10-CM

## 2020-11-23 ENCOUNTER — Other Ambulatory Visit: Payer: Self-pay

## 2020-11-23 ENCOUNTER — Other Ambulatory Visit (INDEPENDENT_AMBULATORY_CARE_PROVIDER_SITE_OTHER): Payer: BC Managed Care – PPO

## 2020-11-23 DIAGNOSIS — E039 Hypothyroidism, unspecified: Secondary | ICD-10-CM | POA: Diagnosis not present

## 2020-11-23 DIAGNOSIS — D7589 Other specified diseases of blood and blood-forming organs: Secondary | ICD-10-CM | POA: Diagnosis not present

## 2020-11-23 DIAGNOSIS — R6889 Other general symptoms and signs: Secondary | ICD-10-CM

## 2020-11-23 DIAGNOSIS — E7800 Pure hypercholesterolemia, unspecified: Secondary | ICD-10-CM

## 2020-11-23 DIAGNOSIS — Z9049 Acquired absence of other specified parts of digestive tract: Secondary | ICD-10-CM

## 2020-11-23 DIAGNOSIS — Z125 Encounter for screening for malignant neoplasm of prostate: Secondary | ICD-10-CM

## 2020-11-23 LAB — COMPREHENSIVE METABOLIC PANEL
ALT: 28 U/L (ref 0–53)
AST: 18 U/L (ref 0–37)
Albumin: 4.3 g/dL (ref 3.5–5.2)
Alkaline Phosphatase: 57 U/L (ref 39–117)
BUN: 17 mg/dL (ref 6–23)
CO2: 25 mEq/L (ref 19–32)
Calcium: 9.2 mg/dL (ref 8.4–10.5)
Chloride: 105 mEq/L (ref 96–112)
Creatinine, Ser: 1.27 mg/dL (ref 0.40–1.50)
GFR: 57.84 mL/min — ABNORMAL LOW (ref >60.00)
Glucose, Bld: 103 mg/dL — ABNORMAL HIGH (ref 70–99)
Potassium: 4.5 mEq/L (ref 3.5–5.1)
Sodium: 139 mEq/L (ref 135–145)
Total Bilirubin: 0.5 mg/dL (ref 0.2–1.2)
Total Protein: 6.5 g/dL (ref 6.0–8.3)

## 2020-11-23 LAB — CBC WITH DIFFERENTIAL/PLATELET
Basophils Absolute: 0 10*3/uL (ref 0.0–0.1)
Basophils Relative: 0.7 % (ref 0.0–3.0)
Eosinophils Absolute: 0.1 10*3/uL (ref 0.0–0.7)
Eosinophils Relative: 2.6 % (ref 0.0–5.0)
HCT: 45 % (ref 39.0–52.0)
Hemoglobin: 15.1 g/dL (ref 13.0–17.0)
Lymphocytes Relative: 25.5 % (ref 12.0–46.0)
Lymphs Abs: 1.3 10*3/uL (ref 0.7–4.0)
MCHC: 33.5 g/dL (ref 30.0–36.0)
MCV: 94.4 fl (ref 78.0–100.0)
Monocytes Absolute: 0.8 10*3/uL (ref 0.1–1.0)
Monocytes Relative: 15.4 % — ABNORMAL HIGH (ref 3.0–12.0)
Neutro Abs: 2.9 10*3/uL (ref 1.4–7.7)
Neutrophils Relative %: 55.8 % (ref 43.0–77.0)
Platelets: 253 10*3/uL (ref 150.0–400.0)
RBC: 4.76 Mil/uL (ref 4.22–5.81)
RDW: 14.4 % (ref 11.5–15.5)
WBC: 5.2 10*3/uL (ref 4.0–10.5)

## 2020-11-23 LAB — PSA, MEDICARE: PSA: 0.92 ng/ml (ref 0.10–4.00)

## 2020-11-23 LAB — TSH: TSH: 3.36 u[IU]/mL (ref 0.35–4.50)

## 2020-11-23 LAB — LIPID PANEL
Cholesterol: 133 mg/dL (ref 0–200)
HDL: 54.3 mg/dL (ref >39.00)
LDL Cholesterol: 64 mg/dL (ref 0–99)
NonHDL: 78.37
Total CHOL/HDL Ratio: 2
Triglycerides: 70 mg/dL (ref 0.0–149.0)
VLDL: 14 mg/dL (ref 0.0–40.0)

## 2020-11-23 LAB — VITAMIN B12: Vitamin B-12: 435 pg/mL (ref 211–911)

## 2020-11-30 ENCOUNTER — Other Ambulatory Visit: Payer: Self-pay

## 2020-11-30 ENCOUNTER — Ambulatory Visit (INDEPENDENT_AMBULATORY_CARE_PROVIDER_SITE_OTHER): Payer: BC Managed Care – PPO | Admitting: Family Medicine

## 2020-11-30 ENCOUNTER — Encounter: Payer: Self-pay | Admitting: Family Medicine

## 2020-11-30 VITALS — BP 134/78 | HR 51 | Temp 97.7°F | Ht 67.0 in | Wt 205.2 lb

## 2020-11-30 DIAGNOSIS — Z7189 Other specified counseling: Secondary | ICD-10-CM | POA: Diagnosis not present

## 2020-11-30 DIAGNOSIS — Z9049 Acquired absence of other specified parts of digestive tract: Secondary | ICD-10-CM | POA: Diagnosis not present

## 2020-11-30 DIAGNOSIS — E039 Hypothyroidism, unspecified: Secondary | ICD-10-CM | POA: Diagnosis not present

## 2020-11-30 DIAGNOSIS — E669 Obesity, unspecified: Secondary | ICD-10-CM

## 2020-11-30 DIAGNOSIS — K219 Gastro-esophageal reflux disease without esophagitis: Secondary | ICD-10-CM

## 2020-11-30 DIAGNOSIS — Z0000 Encounter for general adult medical examination without abnormal findings: Secondary | ICD-10-CM

## 2020-11-30 DIAGNOSIS — E7800 Pure hypercholesterolemia, unspecified: Secondary | ICD-10-CM

## 2020-11-30 DIAGNOSIS — I700 Atherosclerosis of aorta: Secondary | ICD-10-CM

## 2020-11-30 DIAGNOSIS — Z23 Encounter for immunization: Secondary | ICD-10-CM | POA: Diagnosis not present

## 2020-11-30 DIAGNOSIS — C184 Malignant neoplasm of transverse colon: Secondary | ICD-10-CM

## 2020-11-30 DIAGNOSIS — I2510 Atherosclerotic heart disease of native coronary artery without angina pectoris: Secondary | ICD-10-CM

## 2020-11-30 DIAGNOSIS — Z7289 Other problems related to lifestyle: Secondary | ICD-10-CM

## 2020-11-30 DIAGNOSIS — Z789 Other specified health status: Secondary | ICD-10-CM

## 2020-11-30 MED ORDER — SYNTHROID 100 MCG PO TABS
100.0000 ug | ORAL_TABLET | Freq: Every day | ORAL | 3 refills | Status: DC
Start: 2020-11-30 — End: 2021-12-06

## 2020-11-30 MED ORDER — OMEPRAZOLE 40 MG PO CPDR: 40.0000 mg | DELAYED_RELEASE_CAPSULE | Freq: Every day | ORAL | 3 refills | Status: DC

## 2020-11-30 MED ORDER — ATORVASTATIN CALCIUM 80 MG PO TABS: 80.0000 mg | ORAL_TABLET | Freq: Every day | ORAL | 3 refills | Status: DC

## 2020-11-30 MED ORDER — EZETIMIBE 10 MG PO TABS: 10.0000 mg | ORAL_TABLET | Freq: Every day | ORAL | 3 refills | Status: DC

## 2020-11-30 NOTE — Assessment & Plan Note (Signed)
Discussed use.

## 2020-11-30 NOTE — Assessment & Plan Note (Signed)
Preventative protocols reviewed and updated unless pt declined. Discussed healthy diet and lifestyle.  

## 2020-11-30 NOTE — Assessment & Plan Note (Signed)
Chronic, stable on regular PPI.

## 2020-11-30 NOTE — Assessment & Plan Note (Signed)
Advanced directive - does not have set up. Would want wife then daughters to be HCPOA. Packet previously provided.

## 2020-11-30 NOTE — Assessment & Plan Note (Signed)
Chronic, stable on current regimen. Continue.  

## 2020-11-30 NOTE — Assessment & Plan Note (Signed)
Chronic, stable on statin and zetia and fish oil. The 10-year ASCVD risk score Mikey Bussing DC Brooke Bonito., et al., 2013) is: 14.1%   Values used to calculate the score:     Age: 70 years     Sex: Male     Is Non-Hispanic African American: No     Diabetic: No     Tobacco smoker: No     Systolic Blood Pressure: 409 mmHg     Is BP treated: No     HDL Cholesterol: 54.3 mg/dL     Total Cholesterol: 133 mg/dL

## 2020-11-30 NOTE — Assessment & Plan Note (Signed)
Continue statin, aspirin 

## 2020-11-30 NOTE — Progress Notes (Signed)
Patient ID: Caleb Smith, male    DOB: 04-22-1951, 70 y.o.   MRN: [id number]  This visit was conducted in person.  BP 134/78   Pulse (!) 51   Temp 97.7 F (36.5 C) (Temporal)   Ht 5\' 7"  (1.702 m)   Wt 205 lb 3 oz (93.1 kg)   SpO2 97%   BMI 32.14 kg/m    CC: CPE Subjective:   HPI: Caleb Smith is a 70 y.o. male presenting on 11/30/2020 for Medicare Wellness and Annual Exam   Has medicare part A.   No exam data present  [place] Visit from 11/30/2020 in [place] at [facility] Total Score 0      No flowsheet data found.    Colon cancer diagnosis last year s/p colon cancer resection (Dr Dema Severin at [place]). Margins uninvolved by cancer, 0/17 LN positive for cancer.   CT from 05/2020 incidentally showed aortic ATH and CAC, as well as prostate enlargement.   COVID infection 07/2020 - symptoms fully resolved.   Preventative: COLONOSCOPY Date: 03/2003 small sigmoid polyp, rpt 5 yrs [name]).  COLONOSCOPY 06/07/2020 - 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 (Mansouraty, Telford Nab., MD) - planned yearly colonoscopy  Prostate cancer screening - discussed screening, deferred DRE. Declines significant BPH symptoms. No fmhx prostate cancer.  Lung cancer screening - not eligible  Flu shot yearly Pneumovax 2013, prevnar 06/2017. Last pneumovax today  Td 2011, update Tdap today  COVID vaccine - J&J 09/2019, [place] booster 06/2020 Zostavax 2013  Shingrix -01/2018, 06/2018 Advanced directive - does not have set up. Would want wife then daughters to be HCPOA. Packet previously provided.  Seat belt use discussed Sunscreen use discussed. No changing moles on skin  Non smoker  Alcohol - 2-3 beers/day, more on weekends Dentist - due  Eye exam - yearly  Bowel - no constipation  Bladder - no incontinence   Lives with wife Grown children Occupation: Gen foreman/lineman  Edu: HS Activity: active on farm   Diet:somewater, fruits/vegetables daily     Relevant past medical, surgical, family and social history reviewed and updated as indicated. Interim medical history since our last visit reviewed. Allergies and medications reviewed and updated. Outpatient Medications Prior to Visit  Medication Sig Dispense Refill  . ASPIRIN LOW DOSE 81 MG chewable tablet CHEW 1 TABLET (81 MG TOTAL) BY MOUTH DAILY. (Patient taking differently: Chew 81 mg by mouth daily.) 30 tablet 1  . Cholecalciferol (VITAMIN D3) 50 MCG (2000 UT) TABS Take 2,000 Units by mouth daily.    . Omega-3 Fatty Acids (FISH OIL) 1200 MG CAPS Take 1,200 mg by mouth daily.    . vitamin B-12 (CYANOCOBALAMIN) 1000 MCG tablet Take 1,000 mcg by mouth daily.    Marland Kitchen atorvastatin (LIPITOR) 80 MG tablet TAKE 1 TABLET BY MOUTH EVERY DAY (Patient taking differently: Take 80 mg by mouth daily.) 90 tablet 3  . ezetimibe (ZETIA) 10 MG tablet TAKE 1 TABLET BY MOUTH EVERY DAY (Patient taking differently: Take 10 mg by mouth daily.) 90 tablet 3  . omeprazole (PRILOSEC) 40 MG capsule TAKE 1 CAPSULE BY MOUTH EVERY DAY (Patient taking differently: Take 40 mg by mouth daily.) 90 capsule 3  . SYNTHROID 100 MCG tablet TAKE 1 TABLET (100 MCG TOTAL) BY MOUTH DAILY BEFORE BREAKFAST. 90 tablet 0  . amoxicillin (AMOXIL) 500 MG tablet Take 2g 1 hour prior to colonoscopy procedure (Patient taking differently: Take 2,000 mg by mouth See  admin instructions. Take 2g 1 hour prior to colonoscopy procedure) 10 tablet 0   No facility-administered medications prior to visit.     Per HPI unless specifically indicated in ROS section below Review of Systems  Constitutional: Negative for activity change, appetite change, chills, fatigue, fever and unexpected weight change.  HENT: Negative for hearing loss.   Eyes: Negative for visual disturbance.  Respiratory: Positive for cough. Negative for chest tightness, shortness of breath and wheezing.   Cardiovascular: Negative for  chest pain, palpitations and leg swelling.  Gastrointestinal: Negative for abdominal distention, abdominal pain, blood in stool, constipation, diarrhea, nausea and vomiting.  Genitourinary: Negative for difficulty urinating and hematuria.  Musculoskeletal: Negative for arthralgias, myalgias and neck pain.  Skin: Negative for rash.  Neurological: Positive for headaches (last weekend - sinus related (after cutting dust/hay)). Negative for dizziness, seizures and syncope.  Hematological: Negative for adenopathy. Does not bruise/bleed easily.  Psychiatric/Behavioral: Negative for dysphoric mood. The patient is not nervous/anxious.    Objective:  BP 134/78   Pulse (!) 51   Temp 97.7 F (36.5 C) (Temporal)   Ht 5\' 7"  (1.702 m)   Wt 205 lb 3 oz (93.1 kg)   SpO2 97%   BMI 32.14 kg/m   Wt Readings from Last 3 Encounters:  11/30/20 205 lb 3 oz (93.1 kg)  09/07/20 195 lb 12.3 oz (88.8 kg)  08/28/20 204 lb (92.5 kg)      Physical Exam Vitals and nursing note reviewed.  Constitutional:      General: He is not in acute distress.    Appearance: Normal appearance. He is well-developed.  HENT:     Head: Normocephalic and atraumatic.     Right Ear: Hearing, tympanic membrane, ear canal and external ear normal.     Left Ear: Hearing, tympanic membrane, ear canal and external ear normal.  Eyes:     General: No scleral icterus.    Extraocular Movements: Extraocular movements intact.     Conjunctiva/sclera: Conjunctivae normal.     Pupils: Pupils are equal, round, and reactive to light.  Neck:     Thyroid: No thyroid mass or thyromegaly.     Vascular: No carotid bruit.  Cardiovascular:     Rate and Rhythm: Normal rate and regular rhythm.     Pulses: Normal pulses.          Radial pulses are 2+ on the right side and 2+ on the left side.     Heart sounds: Normal heart sounds. No murmur heard.   Pulmonary:     Effort: Pulmonary effort is normal. No respiratory distress.     Breath sounds:  Normal breath sounds. No wheezing, rhonchi or rales.  Abdominal:     General: Bowel sounds are normal. There is no distension.     Palpations: Abdomen is soft. There is no mass.     Tenderness: There is no abdominal tenderness. There is no guarding or rebound.     Hernia: No hernia is present.  Musculoskeletal:        General: Normal range of motion.     Cervical back: Normal range of motion and neck supple.     Right lower leg: No edema.     Left lower leg: No edema.  Lymphadenopathy:     Cervical: No cervical adenopathy.  Skin:    General: Skin is warm and dry.     Findings: No rash.  Neurological:     General: No focal deficit present.  Mental Status: He is alert and oriented to person, place, and time.     Comments: CN grossly intact, station and gait intact  Psychiatric:        Mood and Affect: Mood normal.        Behavior: Behavior normal.        Thought Content: Thought content normal.        Judgment: Judgment normal.       Results for orders placed or performed in visit on 11/23/20  Vitamin B12  Result Value Ref Range   Vitamin B-12 435 211 - 911 pg/mL  PSA, Medicare  Result Value Ref Range   PSA 0.92 0.10 - 4.00 ng/ml  CBC with Differential/Platelet  Result Value Ref Range   WBC 5.2 4.0 - 10.5 K/uL   RBC 4.76 4.22 - 5.81 Mil/uL   Hemoglobin 15.1 13.0 - 17.0 g/dL   HCT 45.0 39.0 - 52.0 %   MCV 94.4 78.0 - 100.0 fl   MCHC 33.5 30.0 - 36.0 g/dL   RDW 14.4 11.5 - 15.5 %   Platelets 253.0 150.0 - 400.0 K/uL   Neutrophils Relative % 55.8 43.0 - 77.0 %   Lymphocytes Relative 25.5 12.0 - 46.0 %   Monocytes Relative 15.4 (H) 3.0 - 12.0 %   Eosinophils Relative 2.6 0.0 - 5.0 %   Basophils Relative 0.7 0.0 - 3.0 %   Neutro Abs 2.9 1.4 - 7.7 K/uL   Lymphs Abs 1.3 0.7 - 4.0 K/uL   Monocytes Absolute 0.8 0.1 - 1.0 K/uL   Eosinophils Absolute 0.1 0.0 - 0.7 K/uL   Basophils Absolute 0.0 0.0 - 0.1 K/uL  TSH  Result Value Ref Range   TSH 3.36 0.35 - 4.50 uIU/mL   Comprehensive metabolic panel  Result Value Ref Range   Sodium 139 135 - 145 mEq/L   Potassium 4.5 3.5 - 5.1 mEq/L   Chloride 105 96 - 112 mEq/L   CO2 25 19 - 32 mEq/L   Glucose, Bld 103 (H) 70 - 99 mg/dL   BUN 17 6 - 23 mg/dL   Creatinine, Ser 1.27 0.40 - 1.50 mg/dL   Total Bilirubin 0.5 0.2 - 1.2 mg/dL   Alkaline Phosphatase 57 39 - 117 U/L   AST 18 0 - 37 U/L   ALT 28 0 - 53 U/L   Total Protein 6.5 6.0 - 8.3 g/dL   Albumin 4.3 3.5 - 5.2 g/dL   GFR 57.84 (L) >60.00 mL/min   Calcium 9.2 8.4 - 10.5 mg/dL  Lipid panel  Result Value Ref Range   Cholesterol 133 0 - 200 mg/dL   Triglycerides 70.0 0.0 - 149.0 mg/dL   HDL 54.30 >39.00 mg/dL   VLDL 14.0 0.0 - 40.0 mg/dL   LDL Cholesterol 64 0 - 99 mg/dL   Total CHOL/HDL Ratio 2    NonHDL 78.37    Assessment & Plan:  This visit occurred during the SARS-CoV-2 public health emergency.  Safety protocols were in place, including screening questions prior to the visit, additional usage of staff PPE, and extensive cleaning of exam room while observing appropriate contact time as indicated for disinfecting solutions.   Problem List Items Addressed This Visit    Hypothyroidism    Chronic, stable on current regimen. Continue.       Relevant Medications   SYNTHROID 100 MCG tablet   HLD (hyperlipidemia)    Chronic, stable on statin and zetia and fish oil. The 10-year ASCVD risk score Mikey Bussing DC  Brooke Bonito., et al., 2013) is: 14.1%   Values used to calculate the score:     Age: 75 years     Sex: Male     Is Non-Hispanic African American: No     Diabetic: No     Tobacco smoker: No     Systolic Blood Pressure: 341 mmHg     Is BP treated: No     HDL Cholesterol: 54.3 mg/dL     Total Cholesterol: 133 mg/dL       Relevant Medications   ezetimibe (ZETIA) 10 MG tablet   atorvastatin (LIPITOR) 80 MG tablet   GERD (gastroesophageal reflux disease)    Chronic, stable on regular PPI.       Relevant Medications   omeprazole (PRILOSEC) 40 MG  capsule   Obesity, Class I, BMI 30-34.9    Encouraged healthy diet and lifestyle choices to affect weight loss.       Health maintenance examination    Preventative protocols reviewed and updated unless pt declined. Discussed healthy diet and lifestyle.       Atherosclerosis of aorta (HCC)    Continue statin, aspirin.       Relevant Medications   ezetimibe (ZETIA) 10 MG tablet   atorvastatin (LIPITOR) 80 MG tablet   Coronary artery calcification seen on CAT scan    Continue aspirin, statin. He will call to schedule cards f/u.as overdue.       Relevant Medications   ezetimibe (ZETIA) 10 MG tablet   atorvastatin (LIPITOR) 80 MG tablet   Advanced care planning/counseling discussion    Advanced directive - does not have set up. Would want wife then daughters to be HCPOA. Packet previously provided.       Alcohol use    Discussed use.       Cancer of distal transverse colon pT2, pN0 s/p lap colectomy 09/05/2020    Appreciate GI and gen surg care. No evidence of disease on latest evaluation.       S/P laparoscopic-assisted sigmoidectomy - Primary    Other Visit Diagnoses    Need for Tdap vaccination       Relevant Orders   Tdap vaccine greater than or equal to 7yo IM (Completed)       Meds ordered this encounter  Medications  . SYNTHROID 100 MCG tablet    Sig: Take 1 tablet (100 mcg total) by mouth daily before breakfast.    Dispense:  90 tablet    Refill:  3  . omeprazole (PRILOSEC) 40 MG capsule    Sig: Take 1 capsule (40 mg total) by mouth daily.    Dispense:  90 capsule    Refill:  3  . ezetimibe (ZETIA) 10 MG tablet    Sig: Take 1 tablet (10 mg total) by mouth daily.    Dispense:  90 tablet    Refill:  3  . atorvastatin (LIPITOR) 80 MG tablet    Sig: Take 1 tablet (80 mg total) by mouth daily.    Dispense:  90 tablet    Refill:  3   Orders Placed This Encounter  Procedures  . Tdap vaccine greater than or equal to 7yo IM    Patient instructions: Tdap  today (tetanus and whooping cough)  Work on setting up advanced directives at home.  Increase water intake.  Continue current medicines. Return as needed or in 1 year for next physical.  Call cardiology (Dr Radford Pax) for follow up visit.   Follow up plan: Return in about  1 year (around 11/30/2021) for annual exam, prior fasting for blood work.  Ria Bush, MD

## 2020-11-30 NOTE — Assessment & Plan Note (Signed)
Continue aspirin, statin. He will call to schedule cards f/u.as overdue.

## 2020-11-30 NOTE — Assessment & Plan Note (Signed)
Encouraged healthy diet and lifestyle choices to affect weight loss.

## 2020-11-30 NOTE — Patient Instructions (Addendum)
Tdap today (tetanus and whooping cough)  Work on setting up advanced directives at home.  Increase water intake.  Continue current medicines. Return as needed or in 1 year for next physical.  Call cardiology (Dr Radford Pax) for follow up visit.   Health Maintenance After Age 69 After age 62, you are at a higher risk for certain long-term diseases and infections as well as injuries from falls. Falls are a major cause of broken bones and head injuries in people who are older than age 23. Getting regular preventive care can help to keep you healthy and well. Preventive care includes getting regular testing and making lifestyle changes as recommended by your health care provider. Talk with your health care provider about:  Which screenings and tests you should have. A screening is a test that checks for a disease when you have no symptoms.  A diet and exercise plan that is right for you. What should I know about screenings and tests to prevent falls? Screening and testing are the best ways to find a health problem early. Early diagnosis and treatment give you the best chance of managing medical conditions that are common after age 52. Certain conditions and lifestyle choices may make you more likely to have a fall. Your health care provider may recommend:  Regular vision checks. Poor vision and conditions such as cataracts can make you more likely to have a fall. If you wear glasses, make sure to get your prescription updated if your vision changes.  Medicine review. Work with your health care provider to regularly review all of the medicines you are taking, including over-the-counter medicines. Ask your health care provider about any side effects that may make you more likely to have a fall. Tell your health care provider if any medicines that you take make you feel dizzy or sleepy.  Osteoporosis screening. Osteoporosis is a condition that causes the bones to get weaker. This can make the bones weak and  cause them to break more easily.  Blood pressure screening. Blood pressure changes and medicines to control blood pressure can make you feel dizzy.  Strength and balance checks. Your health care provider may recommend certain tests to check your strength and balance while standing, walking, or changing positions.  Foot health exam. Foot pain and numbness, as well as not wearing proper footwear, can make you more likely to have a fall.  Depression screening. You may be more likely to have a fall if you have a fear of falling, feel emotionally low, or feel unable to do activities that you used to do.  Alcohol use screening. Using too much alcohol can affect your balance and may make you more likely to have a fall. What actions can I take to lower my risk of falls? General instructions  Talk with your health care provider about your risks for falling. Tell your health care provider if: ? You fall. Be sure to tell your health care provider about all falls, even ones that seem minor. ? You feel dizzy, sleepy, or off-balance.  Take over-the-counter and prescription medicines only as told by your health care provider. These include any supplements.  Eat a healthy diet and maintain a healthy weight. A healthy diet includes low-fat dairy products, low-fat (lean) meats, and fiber from whole grains, beans, and lots of fruits and vegetables. Home safety  Remove any tripping hazards, such as rugs, cords, and clutter.  Install safety equipment such as grab bars in bathrooms and safety rails on stairs.  Keep rooms and walkways well-lit. Activity  Follow a regular exercise program to stay fit. This will help you maintain your balance. Ask your health care provider what types of exercise are appropriate for you.  If you need a cane or walker, use it as recommended by your health care provider.  Wear supportive shoes that have nonskid soles.   Lifestyle  Do not drink alcohol if your health care  provider tells you not to drink.  If you drink alcohol, limit how much you have: ? 0-1 drink a day for women. ? 0-2 drinks a day for men.  Be aware of how much alcohol is in your drink. In the [place] one drink equals one typical bottle of beer (12 oz), one-half glass of wine (5 oz), or one shot of hard liquor (1 oz).  Do not use any products that contain nicotine or tobacco, such as cigarettes and e-cigarettes. If you need help quitting, ask your health care provider. Summary  Having a healthy lifestyle and getting preventive care can help to protect your health and wellness after age 75.  Screening and testing are the best way to find a health problem early and help you avoid having a fall. Early diagnosis and treatment give you the best chance for managing medical conditions that are more common for people who are older than age 3.  Falls are a major cause of broken bones and head injuries in people who are older than age 68. Take precautions to prevent a fall at home.  Work with your health care provider to learn what changes you can make to improve your health and wellness and to prevent falls. This information is not intended to replace advice given to you by your health care provider. Make sure you discuss any questions you have with your health care provider. Document Revised: 10/07/2018 Document Reviewed: 04/29/2017 Elsevier Patient Education  2021 [employer].

## 2020-11-30 NOTE — Assessment & Plan Note (Addendum)
Appreciate GI and gen surg care. No evidence of disease on latest evaluation.

## 2021-01-25 ENCOUNTER — Encounter: Payer: Self-pay | Admitting: Surgical

## 2021-01-25 ENCOUNTER — Other Ambulatory Visit: Payer: Self-pay

## 2021-01-25 ENCOUNTER — Ambulatory Visit (INDEPENDENT_AMBULATORY_CARE_PROVIDER_SITE_OTHER): Payer: BC Managed Care – PPO | Admitting: Surgical

## 2021-01-25 DIAGNOSIS — S41112A Laceration without foreign body of left upper arm, initial encounter: Secondary | ICD-10-CM | POA: Diagnosis not present

## 2021-01-25 DIAGNOSIS — Z96651 Presence of right artificial knee joint: Secondary | ICD-10-CM

## 2021-01-25 MED ORDER — AMOXICILLIN 500 MG PO TABS: ORAL_TABLET | ORAL | 0 refills | Status: AC

## 2021-01-25 NOTE — Progress Notes (Signed)
Office Visit Note   Patient: Caleb Smith           Date of Birth: [birth year]           MRN: [id number] Visit Date: 01/25/2021 Requested by: Ria Bush, MD [place],  [place] [id number] PCP: Ria Bush, MD  Subjective: Chief Complaint  Patient presents with   Other     Left arm laceration    HPI: Caleb Smith is a 70 y.o. male who presents to the office complaining of left arm laceration.  Patient states that he was working on his lawnmower today when he cut himself on the lawnmower blade.  He initially did not want to come in but he was encouraged by his wife and daughter due to his history of total knee arthroplasty about a year and a half ago.  He reports that he has updated his tetanus within the last year.  He cleaned it out at home with alcohol and hydrogen peroxide.  This happened around 11:30 AM today.  No difficulty moving his hand..                ROS: All systems reviewed are negative as they relate to the chief complaint within the history of present illness.  Patient denies fevers or chills.  Assessment & Plan: Visit Diagnoses:  1. Arm laceration, left, initial encounter   2. S/P total knee arthroplasty, right     Plan: Patient is a 70 year old male who presents for evaluation of left arm laceration.  He was working on his lawnmower when he cut himself on the blade.  His tetanus is up-to-date.  Small laceration was washed out with 500 mL of sterile saline and the skin edges were brought together with three 3-0 nylon sutures that were loosely tied to allow for a small amount of drainage.  Xeroform, 4 x 4, Tegaderm applied.  He may shower but no submersion.  He will follow-up next Wednesday for wound recheck and then follow-up 1 more time after that for suture removal.  Prescribed amoxicillin to take with 2000 mg taken today for prophylaxis against prosthetic joint infection and then just 500 mg twice daily for 4 more days after  that.  Discussed the signs of wound infection with Mickey and he will contact the office if he has any concerns.  Follow-Up Instructions: No follow-ups on file.   Orders:  No orders of the defined types were placed in this encounter.  Meds ordered this encounter  Medications   amoxicillin (AMOXIL) 500 MG tablet    Sig: Take 4 tablets (2,000 mg total) by mouth daily for 1 day, THEN 1 tablet (500 mg total) in the morning and at bedtime for 4 days.    Dispense:  12 tablet    Refill:  0      Procedures: Laceration Repair  Date/Time: 01/25/2021 3:07 PM Performed by: Donella Stade, PA-C Authorized by: Donella Stade, PA-C   Consent:    Consent obtained:  Verbal   Consent given by:  Patient   Risks, benefits, and alternatives were discussed: yes     Risks discussed:  Infection, pain, poor cosmetic result and poor wound healing   Alternatives discussed:  No treatment, delayed treatment and observation Anesthesia:    Anesthesia method:  Local infiltration   Local anesthetic:  Lidocaine 1% w/o epi Laceration details:    Location:  Shoulder/arm   Shoulder/arm location:  L lower arm Pre-procedure details:  Preparation:  Patient was prepped and draped in usual sterile fashion Exploration:    Limited defect created (wound extended): no     Hemostasis achieved with:  Direct pressure   Wound exploration: entire depth of wound probed and visualized     Wound extent: no fascia violation noted, no muscle damage noted, no underlying fracture noted and no vascular damage noted   Treatment:    Area cleansed with:  Povidone-iodine and saline   Amount of cleaning:  Standard   Irrigation solution:  Sterile saline   Irrigation volume:  500 mL   Irrigation method:  Syringe   Debridement:  Minimal   Undermining:  None Skin repair:    Repair method:  Sutures   Suture size:  3-0   Suture material:  Nylon   Suture technique:  Simple interrupted Approximation:    Approximation:   Loose Repair type:    Repair type:  Simple Post-procedure details:    Dressing:  Sterile dressing   Procedure completion:  Tolerated well, no immediate complications   Clinical Data: No additional findings.  Objective: Vital Signs: There were no vitals taken for this visit.  Physical Exam:  Constitutional: Patient appears well-developed HEENT:  Head: Normocephalic Eyes:EOM are normal Neck: Normal range of motion Cardiovascular: Normal rate Pulmonary/chest: Effort normal Neurologic: Patient is alert Skin: Skin is warm Psychiatric: Patient has normal mood and affect  Ortho Exam: Ortho exam demonstrates small laceration over the dorsal forearm that extends to the dermis but no concerning finding for excessively deep wound.  No sign of infection.  No gross contamination.  No nerves, arteries, musculature exposed.  Able to actively extend his wrist, extend his thumb, AB duct his fingers, adductor his fingers.  Intact radial pulse of the left upper extremity.  Specialty Comments:  No specialty comments available.  Imaging: No results found.   PMFS History: Patient Active Problem List   Diagnosis Date Noted   S/P laparoscopic-assisted sigmoidectomy 09/05/2020   Cancer of distal transverse colon pT2, pN0 s/p lap colectomy 09/05/2020 06/21/2020   Adenomatous polyp of descending colon 05/14/2020   History of colonic polyps 05/14/2020   Alcohol use 11/25/2019   Arthritis of right knee 08/09/2019   Advanced care planning/counseling discussion 11/13/2017   Coronary artery calcification seen on CAT scan 08/21/2017   Atherosclerosis of aorta ([facility]) 07/03/2017   Lung nodule, solitary 06/28/2017   Pedal edema 11/05/2016   Upper airway cough syndrome 06/26/2016   Plantar fasciitis, right 10/30/2015   Health maintenance examination 10/27/2014   Lesion of nose 07/14/2014   Obesity, Class I, BMI 30-34.9 07/14/2014   Hypothyroidism    HLD (hyperlipidemia)    GERD (gastroesophageal  reflux disease)    Past Medical History:  Diagnosis Date   Arthritis    Colon cancer ([facility])    Coronary artery calcification seen on CAT scan 08/21/2017   COVID-19 virus infection 07/2020   GERD (gastroesophageal reflux disease) 1995   s/p esoph dilation for stricture   HLD (hyperlipidemia)    Hypothyroidism    Obesity 07/14/2014    Family History  Problem Relation Age of Onset   Cancer Mother        breast   Pancreatic cancer Mother    Liver cancer Mother    CAD Maternal Uncle 16       massive MI   Suicidality Father        suicide   Stroke Neg Hx    Diabetes Neg Hx    Hypertension  Neg Hx    Colon cancer Neg Hx    Esophageal cancer Neg Hx    Inflammatory bowel disease Neg Hx    Rectal cancer Neg Hx    Stomach cancer Neg Hx     Past Surgical History:  Procedure Laterality Date   CARDIOVASCULAR STRESS TEST  07/2017   low risk study ([facility])   COLON RESECTION  09/05/2020   Procedure: LAPAROSCOPIC  SEGMENTAL COLECTOMY, TAKE DOWN OF SPLEENIC FLEXURE;  Surgeon: Ileana Roup, MD;  Location: [facility];  Service: General;;   COLONOSCOPY  03/2003   small sigmoid polyp, rpt 5 yrs [name])   COLONOSCOPY WITH PROPOFOL N/A 02/14/2020   SSP Bonna Gains, Varnita B, MD)   COLONOSCOPY WITH PROPOFOL N/A 06/07/2020   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 (Mansouraty, Telford Nab., MD)   ENDOSCOPIC MUCOSAL RESECTION N/A 06/07/2020   Procedure: ENDOSCOPIC MUCOSAL RESECTION;  Surgeon: Irving Copas., MD;  Location: [facility];  Service: Gastroenterology;  Laterality: N/A;   ESOPHAGOGASTRODUODENOSCOPY  1995   s/p esophageal dilation   FACIAL RECONSTRUCTION SURGERY  1990s   cow headbutted him   FINGER SURGERY Left teenager   reattachment of 2nd,3rd,4th   HEMOSTASIS CLIP PLACEMENT  06/07/2020   Procedure: HEMOSTASIS CLIP PLACEMENT;  Surgeon: Irving Copas., MD;  Location: [facility];  Service: Gastroenterology;;   POLYPECTOMY  06/07/2020    Procedure: POLYPECTOMY;  Surgeon: Irving Copas., MD;  Location: [facility];  Service: Gastroenterology;;   SUBMUCOSAL LIFTING INJECTION  06/07/2020   Procedure: SUBMUCOSAL LIFTING INJECTION;  Surgeon: Irving Copas., MD;  Location: [facility];  Service: Gastroenterology;;   TONSILLECTOMY     TOTAL KNEE ARTHROPLASTY Right 08/09/2019   TOTAL KNEE ARTHROPLASTY Right 08/09/2019   Procedure: RIGHT TOTAL KNEE ARTHROPLASTY-CEMENTED;  Surgeon: Meredith Pel, MD;  Location: [facility];  Service: Orthopedics;  Laterality: Right;   Social History   Occupational History   Not on file  Tobacco Use   Smoking status: Never   Smokeless tobacco: Former    Types: Nurse, children's Use: Never used  Substance and Sexual Activity   Alcohol use: Yes    Alcohol/week: 0.0 standard drinks    Comment: 2-3 beer daily   Drug use: No   Sexual activity: Not on file

## 2021-01-30 ENCOUNTER — Ambulatory Visit: Payer: BC Managed Care – PPO | Admitting: Orthopedic Surgery

## 2021-04-24 DIAGNOSIS — Z08 Encounter for follow-up examination after completed treatment for malignant neoplasm: Secondary | ICD-10-CM | POA: Diagnosis not present

## 2021-04-24 DIAGNOSIS — Z85038 Personal history of other malignant neoplasm of large intestine: Secondary | ICD-10-CM | POA: Diagnosis not present

## 2021-06-18 ENCOUNTER — Encounter: Payer: Self-pay | Admitting: Gastroenterology

## 2021-07-12 ENCOUNTER — Other Ambulatory Visit: Payer: Self-pay

## 2021-07-12 MED ORDER — AMOXICILLIN 500 MG PO CAPS: ORAL_CAPSULE | ORAL | 0 refills | Status: DC

## 2021-07-12 NOTE — Progress Notes (Signed)
Patient requested abx for pre-procedure. This has been submitted to pharmacy.

## 2021-07-24 ENCOUNTER — Other Ambulatory Visit: Payer: Self-pay

## 2021-07-24 ENCOUNTER — Ambulatory Visit (AMBULATORY_SURGERY_CENTER): Payer: Self-pay

## 2021-07-24 VITALS — Ht 67.0 in | Wt 200.0 lb

## 2021-07-24 DIAGNOSIS — Z85038 Personal history of other malignant neoplasm of large intestine: Secondary | ICD-10-CM

## 2021-07-24 MED ORDER — PEG 3350-KCL-NA BICARB-NACL 420 G PO SOLR: 4000.0000 mL | Freq: Once | ORAL | 0 refills | Status: AC

## 2021-07-24 NOTE — Progress Notes (Signed)
Denies allergies to eggs or soy products. Denies complication of anesthesia or sedation. Denies use of weight loss medication. Denies use of O2.   Emmi instructions given for colonoscopy.  

## 2021-07-31 HISTORY — PX: COLONOSCOPY: SHX174

## 2021-08-01 ENCOUNTER — Encounter: Payer: Self-pay | Admitting: Gastroenterology

## 2021-08-02 ENCOUNTER — Encounter: Payer: Self-pay | Admitting: Gastroenterology

## 2021-08-07 ENCOUNTER — Encounter: Payer: Self-pay | Admitting: Gastroenterology

## 2021-08-07 ENCOUNTER — Ambulatory Visit (AMBULATORY_SURGERY_CENTER): Payer: BC Managed Care – PPO | Admitting: Gastroenterology

## 2021-08-07 ENCOUNTER — Other Ambulatory Visit: Payer: Self-pay

## 2021-08-07 VITALS — BP 149/97 | HR 61 | Temp 97.1°F | Resp 15 | Ht 67.0 in | Wt 200.0 lb

## 2021-08-07 DIAGNOSIS — D129 Benign neoplasm of anus and anal canal: Secondary | ICD-10-CM

## 2021-08-07 DIAGNOSIS — D128 Benign neoplasm of rectum: Secondary | ICD-10-CM

## 2021-08-07 DIAGNOSIS — D124 Benign neoplasm of descending colon: Secondary | ICD-10-CM

## 2021-08-07 DIAGNOSIS — D125 Benign neoplasm of sigmoid colon: Secondary | ICD-10-CM | POA: Diagnosis not present

## 2021-08-07 DIAGNOSIS — D120 Benign neoplasm of cecum: Secondary | ICD-10-CM

## 2021-08-07 DIAGNOSIS — K6389 Other specified diseases of intestine: Secondary | ICD-10-CM | POA: Diagnosis not present

## 2021-08-07 DIAGNOSIS — D127 Benign neoplasm of rectosigmoid junction: Secondary | ICD-10-CM

## 2021-08-07 DIAGNOSIS — Z85038 Personal history of other malignant neoplasm of large intestine: Secondary | ICD-10-CM | POA: Diagnosis not present

## 2021-08-07 MED ORDER — SODIUM CHLORIDE 0.9 % IV SOLN: 500.0000 mL | Freq: Once | INTRAVENOUS | Status: DC

## 2021-08-07 NOTE — Progress Notes (Signed)
GASTROENTEROLOGY PROCEDURE H&P NOTE   Primary Care Physician: Ria Bush, MD  HPI: Caleb Smith is a 71 y.o. male who presents for Colonoscopy for history of polyps and in 2022 history of Colon Cancer after attempt at EMR of a large TC adenoma and now s/p surgical resection.  Here for first follow up colonoscopy.  Past Medical History:  Diagnosis Date   Arthritis    Colon cancer ([facility])    Coronary artery calcification seen on CAT scan 08/21/2017   COVID-19 virus infection 07/2020   GERD (gastroesophageal reflux disease) 1995   s/p esoph dilation for stricture   HLD (hyperlipidemia)    Hypothyroidism    Obesity 07/14/2014   Past Surgical History:  Procedure Laterality Date   CARDIOVASCULAR STRESS TEST  07/2017   low risk study ([facility])   COLON RESECTION  09/05/2020   Procedure: LAPAROSCOPIC  SEGMENTAL COLECTOMY, TAKE DOWN OF SPLEENIC FLEXURE;  Surgeon: Ileana Roup, MD;  Location[facility];  Service: General;;   COLONOSCOPY  03/2003   small sigmoid polyp, rpt 5 yrs [name])   COLONOSCOPY WITH PROPOFOL N/A 02/14/2020   SSP Bonna Gains, Varnita B, MD)   COLONOSCOPY WITH PROPOFOL N/A 06/07/2020   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 (Mansouraty, Telford Nab., MD)   ENDOSCOPIC MUCOSAL RESECTION N/A 06/07/2020   Procedure: ENDOSCOPIC MUCOSAL RESECTION;  Surgeon: Irving Copas., MD;  Location: [facility];  Service: Gastroenterology;  Laterality: N/A;   ESOPHAGOGASTRODUODENOSCOPY  1995   s/p esophageal dilation   FACIAL RECONSTRUCTION SURGERY  1990s   cow headbutted him   FINGER SURGERY Left teenager   reattachment of 2nd,3rd,4th   HEMOSTASIS CLIP PLACEMENT  06/07/2020   Procedure: HEMOSTASIS CLIP PLACEMENT;  Surgeon: Irving Copas., MD;  Location: [place];  Service: Gastroenterology;;   POLYPECTOMY  06/07/2020   Procedure: POLYPECTOMY;  Surgeon: Irving Copas., MD;  Location: [place];  Service:  Gastroenterology;;   SUBMUCOSAL LIFTING INJECTION  06/07/2020   Procedure: SUBMUCOSAL LIFTING INJECTION;  Surgeon: Irving Copas., MD;  Location: [facility];  Service: Gastroenterology;;   TONSILLECTOMY     TOTAL KNEE ARTHROPLASTY Right 08/09/2019   TOTAL KNEE ARTHROPLASTY Right 08/09/2019   Procedure: RIGHT TOTAL KNEE ARTHROPLASTY-CEMENTED;  Surgeon: Meredith Pel, MD;  Location: [facility];  Service: Orthopedics;  Laterality: Right;   Current Outpatient Medications  Medication Sig Dispense Refill   amoxicillin (AMOXIL) 500 MG capsule Take 500 mg by mouth 3 (three) times daily. Take prior to dental procedures.     ASPIRIN LOW DOSE 81 MG chewable tablet CHEW 1 TABLET (81 MG TOTAL) BY MOUTH DAILY. (Patient taking differently: Chew 81 mg by mouth daily.) 30 tablet 1   atorvastatin (LIPITOR) 80 MG tablet Take 1 tablet (80 mg total) by mouth daily. 90 tablet 3   Cholecalciferol (VITAMIN D3) 50 MCG (2000 UT) TABS Take 2,000 Units by mouth daily.     ezetimibe (ZETIA) 10 MG tablet Take 1 tablet (10 mg total) by mouth daily. 90 tablet 3   Omega-3 Fatty Acids (FISH OIL) 1200 MG CAPS Take 1,200 mg by mouth daily.     omeprazole (PRILOSEC) 40 MG capsule Take 1 capsule (40 mg total) by mouth daily. 90 capsule 3   SYNTHROID 100 MCG tablet Take 1 tablet (100 mcg total) by mouth daily before breakfast. 90 tablet 3   vitamin B-12 (CYANOCOBALAMIN) 1000 MCG tablet Take 1,000 mcg by mouth daily.     Current Facility-Administered Medications  Medication  Dose Route Frequency Provider Last Rate Last Admin   0.9 %  sodium chloride infusion  500 mL Intravenous Once Mansouraty, Telford Nab., MD        Current Outpatient Medications:    amoxicillin (AMOXIL) 500 MG capsule, Take 500 mg by mouth 3 (three) times daily. Take prior to dental procedures., Disp: , Rfl:    ASPIRIN LOW DOSE 81 MG chewable tablet, CHEW 1 TABLET (81 MG TOTAL) BY MOUTH DAILY. (Patient taking differently: Chew 81 mg by mouth daily.),  Disp: 30 tablet, Rfl: 1   atorvastatin (LIPITOR) 80 MG tablet, Take 1 tablet (80 mg total) by mouth daily., Disp: 90 tablet, Rfl: 3   Cholecalciferol (VITAMIN D3) 50 MCG (2000 UT) TABS, Take 2,000 Units by mouth daily., Disp: , Rfl:    ezetimibe (ZETIA) 10 MG tablet, Take 1 tablet (10 mg total) by mouth daily., Disp: 90 tablet, Rfl: 3   Omega-3 Fatty Acids (FISH OIL) 1200 MG CAPS, Take 1,200 mg by mouth daily., Disp: , Rfl:    omeprazole (PRILOSEC) 40 MG capsule, Take 1 capsule (40 mg total) by mouth daily., Disp: 90 capsule, Rfl: 3   SYNTHROID 100 MCG tablet, Take 1 tablet (100 mcg total) by mouth daily before breakfast., Disp: 90 tablet, Rfl: 3   vitamin B-12 (CYANOCOBALAMIN) 1000 MCG tablet, Take 1,000 mcg by mouth daily., Disp: , Rfl:   Current Facility-Administered Medications:    0.9 %  sodium chloride infusion, 500 mL, Intravenous, Once, Mansouraty, Telford Nab., MD No Known Allergies Family History  Problem Relation Age of Onset   Cancer Mother        breast   Pancreatic cancer Mother    Liver cancer Mother    CAD Maternal Uncle 47       massive MI   Suicidality Father        suicide   Stroke Neg Hx    Diabetes Neg Hx    Hypertension Neg Hx    Colon cancer Neg Hx    Esophageal cancer Neg Hx    Inflammatory bowel disease Neg Hx    Rectal cancer Neg Hx    Stomach cancer Neg Hx    Social History   Socioeconomic History   Marital status: Married    Spouse name: Not on file   Number of children: Not on file   Years of education: Not on file   Highest education level: Not on file  Occupational History   Not on file  Tobacco Use   Smoking status: Never   Smokeless tobacco: Former    Types: Nurse, children's Use: Never used  Substance and Sexual Activity   Alcohol use: Yes    Alcohol/week: 0.0 standard drinks    Comment: 2-3 beer daily   Drug use: No   Sexual activity: Not on file  Other Topics Concern   Not on file  Social History Narrative   Lives with  wife   Grown children (daughter is Lendon Collar)   Nephew of the [employer]   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[employer]   Edu: HS   Activity: active on farm   Diet: good water, fruits/vegetables daily   Social Determinants of Radio broadcast assistant Strain: Not on file  Food Insecurity: Not on file  Transportation Needs: Not on file  Physical Activity: Not on file  Stress: Not on file  Social Connections: Not on file  Intimate Partner Violence: Not on file  Physical Exam: Today's Vitals   08/07/21 0934  BP: 111/76  Pulse: 71  Temp: (!) 97.1 F (36.2 C)  TempSrc: Skin  SpO2: 95%  Weight: 200 lb (90.7 kg)  Height: 5\' 7"  (1.702 m)   Body mass index is 31.32 kg/m. GEN: NAD EYE: Sclerae anicteric ENT: MMM CV: Non-tachycardic GI: Soft, NT/ND NEURO:  Alert & Oriented x 3  Lab Results: No results for input(s): WBC, HGB, HCT, PLT in the last 72 hours. BMET No results for input(s): NA, K, CL, CO2, GLUCOSE, BUN, CREATININE, CALCIUM in the last 72 hours. LFT No results for input(s): PROT, ALBUMIN, AST, ALT, ALKPHOS, BILITOT, BILIDIR, IBILI in the last 72 hours. PT/INR No results for input(s): LABPROT, INR in the last 72 hours.   Impression / Plan: This is a 71 y.o.male who presents for Colonoscopy for history of polyps and in 2022 history of Colon Cancer after attempt at EMR of a large TC adenoma and now s/p surgical resection.  Here for first follow up colonoscopy.  The risks and benefits of endoscopic evaluation/treatment were discussed with the patient and/or family; these include but are not limited to the risk of perforation, infection, bleeding, missed lesions, lack of diagnosis, severe illness requiring hospitalization, as well as anesthesia and sedation related illnesses.  The patient's history has been reviewed, patient examined, no change in status, and deemed stable for procedure.  The patient and/or family is agreeable to proceed.    Justice Britain, MD [facility] Advanced Endoscopy Office # [phone]

## 2021-08-07 NOTE — Progress Notes (Signed)
Called to room to assist during endoscopic procedure.  Patient ID and intended procedure confirmed with present staff. Received instructions for my participation in the procedure from the performing physician.  

## 2021-08-07 NOTE — Patient Instructions (Signed)
Handouts provided on polyps, diverticulosis, hemorrhoids and high-fiber diet.   Recommend a high-fiber diet (see handout). Use FiberCon 1-2 tablets by mouth daily.    YOU HAD AN ENDOSCOPIC PROCEDURE TODAY AT [facility]:   Refer to the procedure report that was given to you for any specific questions about what was found during the examination.  If the procedure report does not answer your questions, please call your gastroenterologist to clarify.  If you requested that your care partner not be given the details of your procedure findings, then the procedure report has been included in a sealed envelope for you to review at your convenience later.  YOU SHOULD EXPECT: Some feelings of bloating in the abdomen. Passage of more gas than usual.  Walking can help get rid of the air that was put into your GI tract during the procedure and reduce the bloating. If you had a lower endoscopy (such as a colonoscopy or flexible sigmoidoscopy) you may notice spotting of blood in your stool or on the toilet paper. If you underwent a bowel prep for your procedure, you may not have a normal bowel movement for a few days.  Please Note:  You might notice some irritation and congestion in your nose or some drainage.  This is from the oxygen used during your procedure.  There is no need for concern and it should clear up in a day or so.  SYMPTOMS TO REPORT IMMEDIATELY:  Following lower endoscopy (colonoscopy or flexible sigmoidoscopy):  Excessive amounts of blood in the stool  Significant tenderness or worsening of abdominal pains  Swelling of the abdomen that is new, acute  Fever of 100F or higher  For urgent or emergent issues, a gastroenterologist can be reached at any hour by calling [phone]. Do not use MyChart messaging for urgent concerns.    DIET:  We do recommend a small meal at first, but then you may proceed to your regular diet.  Drink plenty of fluids but you should avoid  alcoholic beverages for 24 hours.  ACTIVITY:  You should plan to take it easy for the rest of today and you should NOT DRIVE or use heavy machinery until tomorrow (because of the sedation medicines used during the test).    FOLLOW UP: Our staff will call the number listed on your records 48-72 hours following your procedure to check on you and address any questions or concerns that you may have regarding the information given to you following your procedure. If we do not reach you, we will leave a message.  We will attempt to reach you two times.  During this call, we will ask if you have developed any symptoms of COVID 19. If you develop any symptoms (ie: fever, flu-like symptoms, shortness of breath, cough etc.) before then, please call [phone].  If you test positive for Covid 19 in the 2 weeks post procedure, please call and report this information to [place].    If any biopsies were taken you will be contacted by phone or by letter within the next 1-3 weeks.  Please call us at [phone] if you have not heard about the biopsies in 3 weeks.    SIGNATURES/CONFIDENTIALITY: You and/or your care partner have signed paperwork which will be entered into your electronic medical record.  These signatures attest to the fact that that the information above on your After Visit Summary has been reviewed and is understood.  Full responsibility of the confidentiality of this discharge  information lies with you and/or your care-partner.

## 2021-08-07 NOTE — Progress Notes (Signed)
Report given to PACU, vss 

## 2021-08-07 NOTE — Op Note (Signed)
[place] Patient Name: Caleb Smith Procedure Date: 08/07/2021 9:45 AM MRN: [id number] Endoscopist: Justice Britain , MD Age: 71 Referring MD:  Date of Birth: 1950/10/09 Gender: Male Account #: [id number] Procedure:                Colonoscopy Indications:              High risk colon cancer surveillance: Personal                            history of non-advanced adenoma, High risk colon                            cancer surveillance: Personal history of colon                            cancer Medicines:                Monitored Anesthesia Care Procedure:                Pre-Anesthesia Assessment:                           - Prior to the procedure, a History and Physical                            was performed, and patient medications and                            allergies were reviewed. The patient's tolerance of                            previous anesthesia was also reviewed. The risks                            and benefits of the procedure and the sedation                            options and risks were discussed with the patient.                            All questions were answered, and informed consent                            was obtained. Prior Anticoagulants: The patient has                            taken no previous anticoagulant or antiplatelet                            agents except for aspirin. ASA Grade Assessment:                            III - A patient with severe systemic disease. After  reviewing the risks and benefits, the patient was                            deemed in satisfactory condition to undergo the                            procedure.                           After obtaining informed consent, the colonoscope                            was passed under direct vision. Throughout the                            procedure, the patient's blood pressure, pulse, and                            oxygen  saturations were monitored continuously. The                            Olympus 190 was introduced through the anus and                            advanced to the 5 cm into the ileum. The                            colonoscopy was performed without difficulty. The                            patient tolerated the procedure. The quality of the                            bowel preparation was adequate. The terminal ileum,                            ileocecal valve, appendiceal orifice, and rectum                            were photographed. Scope In: 9:50:04 AM Scope Out: 10:07:22 AM Scope Withdrawal Time: 0 hours 14 minutes 10 seconds  Total Procedure Duration: 0 hours 17 minutes 18 seconds  Findings:                 The digital rectal exam findings include                            hemorrhoids. Pertinent negatives include no                            palpable rectal lesions.                           The terminal ileum and ileocecal valve appeared  normal.                           Four sessile polyps were found in the rectum,                            sigmoid colon, descending colon and cecum. The                            polyps were 3 to 6 mm in size. These polyps were                            removed with a cold snare. Resection and retrieval                            were complete.                           A large scar was found in the transverse colon.                            Adjacent mucosal findings included some mild                            congestion. Biopsies were taken with a cold forceps                            for histology.                           Removal of a retained suture was accomplished with                            a regular forceps.                           One small-mouthed diverticulum was found in the                            transverse colon near this surgical site.                           Normal mucosa was  found in the entire colon                            otherwise.                           Non-bleeding non-thrombosed internal hemorrhoids                            were found during retroflexion, during perianal                            exam and during digital exam. The hemorrhoids were  Grade II (internal hemorrhoids that prolapse but                            reduce spontaneously). Complications:            No immediate complications. Estimated Blood Loss:     Estimated blood loss was minimal. Impression:               - Hemorrhoids found on digital rectal exam.                           - The examined portion of the ileum was normal.                           - Four 3 to 6 mm polyps in the rectum, in the                            sigmoid colon, in the descending colon and in the                            cecum, removed with a cold snare. Resected and                            retrieved.                           - Scar in the transverse colon with mild congestion                            - biopsied.                           - Suture removed.                           - Single diverticulum noted in the transverse colon.                           - Normal mucosa in the entire examined colon                            othewise.                           - Non-bleeding non-thrombosed internal hemorrhoids. Recommendation:           - The patient will be observed post-procedure,                            until all discharge criteria are met.                           - Discharge patient to home.                           - Patient has a contact number available for  emergencies. The signs and symptoms of potential                            delayed complications were discussed with the                            patient. Return to normal activities tomorrow.                            Written discharge instructions were  provided to the                            patient.                           - High fiber diet.                           - Use FiberCon 1-2 tablets PO daily.                           - Continue present medications.                           - Await pathology results.                           - Repeat colonoscopy in 3 years for surveillance as                            long as no evidence of issues at the anastomosis                            final pathology.                           - The findings and recommendations were discussed                            with the patient.                           - The findings and recommendations were discussed                            with the patient's family. Justice Britain, MD 08/07/2021 10:18:24 AM

## 2021-08-07 NOTE — Progress Notes (Signed)
Pt's states no medical or surgical changes since previsit or office visit. 

## 2021-08-08 ENCOUNTER — Encounter: Payer: Self-pay | Admitting: Family Medicine

## 2021-08-09 ENCOUNTER — Telehealth: Payer: Self-pay

## 2021-08-09 NOTE — Telephone Encounter (Signed)
°  Follow up Call-  Call back number 08/07/2021  Post procedure Call Back phone  # [phone]  Permission to leave phone message Yes  Some recent data might be hidden     Patient questions:  Do you have a fever, pain , or abdominal swelling? No. Pain Score  0 *  Have you tolerated food without any problems? Yes.    Have you been able to return to your normal activities? Yes.    Do you have any questions about your discharge instructions: Diet   No. Medications  No. Follow up visit  No.  Do you have questions or concerns about your Care? No.  Actions: * If pain score is 4 or above: No action needed, pain <4.

## 2021-08-11 ENCOUNTER — Encounter: Payer: Self-pay | Admitting: Gastroenterology

## 2021-10-21 DIAGNOSIS — Z85038 Personal history of other malignant neoplasm of large intestine: Secondary | ICD-10-CM | POA: Diagnosis not present

## 2021-10-21 DIAGNOSIS — Z08 Encounter for follow-up examination after completed treatment for malignant neoplasm: Secondary | ICD-10-CM | POA: Diagnosis not present

## 2021-11-28 ENCOUNTER — Other Ambulatory Visit: Payer: Self-pay | Admitting: Family Medicine

## 2021-11-28 DIAGNOSIS — E039 Hypothyroidism, unspecified: Secondary | ICD-10-CM

## 2021-11-28 DIAGNOSIS — E7800 Pure hypercholesterolemia, unspecified: Secondary | ICD-10-CM

## 2021-11-28 DIAGNOSIS — Z125 Encounter for screening for malignant neoplasm of prostate: Secondary | ICD-10-CM

## 2021-11-29 ENCOUNTER — Other Ambulatory Visit (INDEPENDENT_AMBULATORY_CARE_PROVIDER_SITE_OTHER): Payer: BC Managed Care – PPO

## 2021-11-29 DIAGNOSIS — E7800 Pure hypercholesterolemia, unspecified: Secondary | ICD-10-CM

## 2021-11-29 DIAGNOSIS — E039 Hypothyroidism, unspecified: Secondary | ICD-10-CM

## 2021-11-29 DIAGNOSIS — Z125 Encounter for screening for malignant neoplasm of prostate: Secondary | ICD-10-CM | POA: Diagnosis not present

## 2021-11-29 LAB — LIPID PANEL
Cholesterol: 133 mg/dL (ref 0–200)
HDL: 55 mg/dL (ref >39.00)
LDL Cholesterol: 67 mg/dL (ref 0–99)
NonHDL: 78.1
Total CHOL/HDL Ratio: 2
Triglycerides: 55 mg/dL (ref 0.0–149.0)
VLDL: 11 mg/dL (ref 0.0–40.0)

## 2021-11-29 LAB — COMPREHENSIVE METABOLIC PANEL
ALT: 29 U/L (ref 0–53)
AST: 19 U/L (ref 0–37)
Albumin: 4.3 g/dL (ref 3.5–5.2)
Alkaline Phosphatase: 52 U/L (ref 39–117)
BUN: 18 mg/dL (ref 6–23)
CO2: 25 mEq/L (ref 19–32)
Calcium: 9.5 mg/dL (ref 8.4–10.5)
Chloride: 105 mEq/L (ref 96–112)
Creatinine, Ser: 1.22 mg/dL (ref 0.40–1.50)
GFR: 60.26 mL/min (ref >60.00)
Glucose, Bld: 98 mg/dL (ref 70–99)
Potassium: 4.5 mEq/L (ref 3.5–5.1)
Sodium: 139 mEq/L (ref 135–145)
Total Bilirubin: 0.9 mg/dL (ref 0.2–1.2)
Total Protein: 6.8 g/dL (ref 6.0–8.3)

## 2021-11-29 LAB — PSA: PSA: 0.89 ng/mL (ref 0.10–4.00)

## 2021-11-29 LAB — TSH: TSH: 2.86 u[IU]/mL (ref 0.35–5.50)

## 2021-12-06 ENCOUNTER — Telehealth: Payer: Self-pay | Admitting: Cardiology

## 2021-12-06 ENCOUNTER — Ambulatory Visit (INDEPENDENT_AMBULATORY_CARE_PROVIDER_SITE_OTHER): Payer: BC Managed Care – PPO | Admitting: Family Medicine

## 2021-12-06 ENCOUNTER — Encounter: Payer: Self-pay | Admitting: Family Medicine

## 2021-12-06 VITALS — BP 134/70 | HR 52 | Temp 97.3°F | Ht 66.5 in | Wt 207.5 lb

## 2021-12-06 DIAGNOSIS — I700 Atherosclerosis of aorta: Secondary | ICD-10-CM

## 2021-12-06 DIAGNOSIS — C184 Malignant neoplasm of transverse colon: Secondary | ICD-10-CM

## 2021-12-06 DIAGNOSIS — E7800 Pure hypercholesterolemia, unspecified: Secondary | ICD-10-CM | POA: Diagnosis not present

## 2021-12-06 DIAGNOSIS — Z7189 Other specified counseling: Secondary | ICD-10-CM | POA: Diagnosis not present

## 2021-12-06 DIAGNOSIS — R001 Bradycardia, unspecified: Secondary | ICD-10-CM

## 2021-12-06 DIAGNOSIS — Z0000 Encounter for general adult medical examination without abnormal findings: Secondary | ICD-10-CM

## 2021-12-06 DIAGNOSIS — E039 Hypothyroidism, unspecified: Secondary | ICD-10-CM

## 2021-12-06 DIAGNOSIS — I2510 Atherosclerotic heart disease of native coronary artery without angina pectoris: Secondary | ICD-10-CM | POA: Diagnosis not present

## 2021-12-06 DIAGNOSIS — E66811 Obesity, class 1: Secondary | ICD-10-CM

## 2021-12-06 DIAGNOSIS — F1090 Alcohol use, unspecified, uncomplicated: Secondary | ICD-10-CM

## 2021-12-06 DIAGNOSIS — Z9049 Acquired absence of other specified parts of digestive tract: Secondary | ICD-10-CM

## 2021-12-06 DIAGNOSIS — E669 Obesity, unspecified: Secondary | ICD-10-CM

## 2021-12-06 DIAGNOSIS — K219 Gastro-esophageal reflux disease without esophagitis: Secondary | ICD-10-CM

## 2021-12-06 MED ORDER — OMEPRAZOLE 40 MG PO CPDR: 40.0000 mg | DELAYED_RELEASE_CAPSULE | Freq: Every day | ORAL | 3 refills | Status: DC

## 2021-12-06 MED ORDER — ATORVASTATIN CALCIUM 80 MG PO TABS: 80.0000 mg | ORAL_TABLET | Freq: Every day | ORAL | 3 refills | Status: DC

## 2021-12-06 MED ORDER — SYNTHROID 100 MCG PO TABS: 100.0000 ug | ORAL_TABLET | Freq: Every day | ORAL | 3 refills | Status: DC

## 2021-12-06 MED ORDER — EZETIMIBE 10 MG PO TABS
10.0000 mg | ORAL_TABLET | Freq: Every day | ORAL | 3 refills | Status: DC
Start: 2021-12-06 — End: 2022-12-12

## 2021-12-06 NOTE — Assessment & Plan Note (Addendum)
Advanced directive - does not have set up. Would want wife then daughters to be HCPOA. Does not want prolonged life support if terminal condition. Packet previously provided.

## 2021-12-06 NOTE — Telephone Encounter (Signed)
Patient c/o Palpitations:  High priority if patient c/o lightheadedness, shortness of breath, or chest pain  How long have you had palpitations/irregular HR/ Afib? Are you having the symptoms now?  Patient states he recently saw his PCP, Dr. Danise Mina, for a full physical and his HR was so so they did an EKG. When he got home he was contacted and advised to follow up with Dr. Radford Pax ASAP. Patient states he wouldn't have known his HR was low because he hasn't been having any symptoms. Unsure whether it is still low. Patient is scheduled for 6/18 with Laurann Montana, NP.  Are you currently experiencing lightheadedness, SOB or CP?  No   Do you have a history of afib (atrial fibrillation) or irregular heart rhythm?  No   Have you checked your BP or HR? (document readings if available):  No   Are you experiencing any other symptoms?  No

## 2021-12-06 NOTE — Patient Instructions (Addendum)
Update EKG today Medicines refilled today Earwax removed today.  Good to see you today Return as needed or in 1 year for next physical.   Health Maintenance After Age 70 After age 26, you are at a higher risk for certain long-term diseases and infections as well as injuries from falls. Falls are a major cause of broken bones and head injuries in people who are older than age 63. Getting regular preventive care can help to keep you healthy and well. Preventive care includes getting regular testing and making lifestyle changes as recommended by your health care provider. Talk with your health care provider about: Which screenings and tests you should have. A screening is a test that checks for a disease when you have no symptoms. A diet and exercise plan that is right for you. What should I know about screenings and tests to prevent falls? Screening and testing are the best ways to find a health problem early. Early diagnosis and treatment give you the best chance of managing medical conditions that are common after age 43. Certain conditions and lifestyle choices may make you more likely to have a fall. Your health care provider may recommend: Regular vision checks. Poor vision and conditions such as cataracts can make you more likely to have a fall. If you wear glasses, make sure to get your prescription updated if your vision changes. Medicine review. Work with your health care provider to regularly review all of the medicines you are taking, including over-the-counter medicines. Ask your health care provider about any side effects that may make you more likely to have a fall. Tell your health care provider if any medicines that you take make you feel dizzy or sleepy. Strength and balance checks. Your health care provider may recommend certain tests to check your strength and balance while standing, walking, or changing positions. Foot health exam. Foot pain and numbness, as well as not wearing  proper footwear, can make you more likely to have a fall. Screenings, including: Osteoporosis screening. Osteoporosis is a condition that causes the bones to get weaker and break more easily. Blood pressure screening. Blood pressure changes and medicines to control blood pressure can make you feel dizzy. Depression screening. You may be more likely to have a fall if you have a fear of falling, feel depressed, or feel unable to do activities that you used to do. Alcohol use screening. Using too much alcohol can affect your balance and may make you more likely to have a fall. Follow these instructions at home: Lifestyle Do not drink alcohol if: Your health care provider tells you not to drink. If you drink alcohol: Limit how much you have to: 0-1 drink a day for women. 0-2 drinks a day for men. Know how much alcohol is in your drink. In the [place] one drink equals one 12 oz bottle of beer (355 mL), one 5 oz glass of wine (148 mL), or one 1 oz glass of hard liquor (44 mL). Do not use any products that contain nicotine or tobacco. These products include cigarettes, chewing tobacco, and vaping devices, such as e-cigarettes. If you need help quitting, ask your health care provider. Activity  Follow a regular exercise program to stay fit. This will help you maintain your balance. Ask your health care provider what types of exercise are appropriate for you. If you need a cane or walker, use it as recommended by your health care provider. Wear supportive shoes that have nonskid soles. Safety  Remove  any tripping hazards, such as rugs, cords, and clutter. Install safety equipment such as grab bars in bathrooms and safety rails on stairs. Keep rooms and walkways well-lit. General instructions Talk with your health care provider about your risks for falling. Tell your health care provider if: You fall. Be sure to tell your health care provider about all falls, even ones that seem minor. You feel  dizzy, tiredness (fatigue), or off-balance. Take over-the-counter and prescription medicines only as told by your health care provider. These include supplements. Eat a healthy diet and maintain a healthy weight. A healthy diet includes low-fat dairy products, low-fat (lean) meats, and fiber from whole grains, beans, and lots of fruits and vegetables. Stay current with your vaccines. Schedule regular health, dental, and eye exams. Summary Having a healthy lifestyle and getting preventive care can help to protect your health and wellness after age 12. Screening and testing are the best way to find a health problem early and help you avoid having a fall. Early diagnosis and treatment give you the best chance for managing medical conditions that are more common for people who are older than age 32. Falls are a major cause of broken bones and head injuries in people who are older than age 31. Take precautions to prevent a fall at home. Work with your health care provider to learn what changes you can make to improve your health and wellness and to prevent falls. This information is not intended to replace advice given to you by your health care provider. Make sure you discuss any questions you have with your health care provider. Document Revised: 11/05/2020 Document Reviewed: 11/05/2020 Elsevier Patient Education  [employer].

## 2021-12-06 NOTE — Assessment & Plan Note (Signed)
Continue aspirin, statin. Update EKG. Pt asxs.

## 2021-12-06 NOTE — Assessment & Plan Note (Signed)
Marked bradycardia 40s by EKG - pt largely asymptomatic. Will refer back to cardiology as overdue for f/u.

## 2021-12-06 NOTE — Assessment & Plan Note (Signed)
Continue aspirin, statin.  

## 2021-12-06 NOTE — Assessment & Plan Note (Signed)
H/o this, continues regular f/u with gen surg and GI.

## 2021-12-06 NOTE — Assessment & Plan Note (Signed)
Chronic, stable on current regimen - continue. 

## 2021-12-06 NOTE — Telephone Encounter (Signed)
Pt saw [name] today and he had an ECG that showed bradycardia HR 43... manually it was in the low 50's which is abnormal for him.... he made the pt an appt with [name] for 12/10/21... ECG in Epic... pt declines SOB, dizziness, no palpitations... no diet changes and no new OTC meds... he says his only complaint is bilateral leg pain when he first gets up in the morning... he does not have any peripheral edema... I talked with him about slow position changes especially when rising in the morning.. he has been hydrating well... BP today 136/74... he cannot check his VS at home.. he will call EMS over the weekend if any difficulty or worsening of symptoms.

## 2021-12-06 NOTE — Progress Notes (Signed)
Patient ID: Caleb Smith, male    DOB: Jul 14, 1950, 71 y.o.   MRN: [id number]  This visit was conducted in person.  BP 134/70   Pulse (!) 52   Temp (!) 97.3 F (36.3 C) (Temporal)   Ht 5' 6.5" (1.689 m)   Wt 207 lb 8 oz (94.1 kg)   SpO2 95%   BMI 32.99 kg/m    CC: CPE Subjective:   HPI: Caleb Smith is a 71 y.o. male presenting on 12/06/2021 for Annual Exam   Medicare part A only.  Colon cancer 2021 s/p colon cancer resection (Dr Dema Severin at [facility]). Margins uninvolved by cancer, 0/17 LN positive for cancer. Continues seeing GI and gen surg - monitoring incisional hernia.   CT from 05/2020 incidentally showed aortic ATH and CAC, as well as prostate enlargement. Stress test 2019 without ischemia.   GERD - continues daily PPI  Preventative: COLONOSCOPY Date: 03/2003 small sigmoid polyp, rpt 5 yrs [name]).  COLONOSCOPY 06/07/2020 - 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 (Mansouraty, Telford Nab., MD)  COLONOSCOPY 07/2021 - polyps, hemorrhoids, congested scar biopsied, single diverticula (Mansouraty)  Rec yearly colonoscopy Prostate cancer screening - yearly PSA. Declines significant BPH symptoms. No fmhx prostate cancer.  Lung cancer screening - not eligible  Flu shot yearly Pneumovax 2013, 10/2019, prevnar-13 06/2017.  Td 2011, Tdap 11/2020 COVID vaccine - J&J 09/2019, Pfizer booster 06/2020  Zostavax 2013  Shingrix - 01/2018, 06/2018 Advanced directive - does not have set up. Would want wife then daughters to be HCPOA. Does not want prolonged life support if terminal condition. Packet previously provided.  Seat belt use discussed  Sunscreen use discussed. No changing moles on skin  Non smoker  Alcohol - 2-4 beers/day, more on weekends. No liquor.  Dentist - yearly Eye exam - yearly  Bowel - no constipation  Bladder - no incontinence   Lives with wife Grown children Occupation: Gen foreman/lineman  Edu: HS Activity: active on farm   Diet: some water, fruits/vegetables daily      Relevant past medical, surgical, family and social history reviewed and updated as indicated. Interim medical history since our last visit reviewed. Allergies and medications reviewed and updated. Outpatient Medications Prior to Visit  Medication Sig Dispense Refill   amoxicillin (AMOXIL) 500 MG capsule Take 500 mg by mouth 3 (three) times daily. Take prior to dental procedures.     ASPIRIN 81 PO Take 1 tablet by mouth daily.     Cholecalciferol (VITAMIN D3) 1.25 MG (50000 UT) CAPS Take 1 capsule by mouth daily.     vitamin B-12 (CYANOCOBALAMIN) 1000 MCG tablet Take 1,000 mcg by mouth daily.     atorvastatin (LIPITOR) 80 MG tablet Take 1 tablet (80 mg total) by mouth daily. 90 tablet 3   ezetimibe (ZETIA) 10 MG tablet Take 1 tablet (10 mg total) by mouth daily. 90 tablet 3   omeprazole (PRILOSEC) 40 MG capsule Take 1 capsule (40 mg total) by mouth daily. 90 capsule 3   SYNTHROID 100 MCG tablet Take 1 tablet (100 mcg total) by mouth daily before breakfast. 90 tablet 3   ASPIRIN LOW DOSE 81 MG chewable tablet CHEW 1 TABLET (81 MG TOTAL) BY MOUTH DAILY. (Patient taking differently: Chew 81 mg by mouth daily.) 30 tablet 1   Cholecalciferol (VITAMIN D3) 50 MCG (2000 UT) TABS Take 2,000 Units by mouth daily.     Omega-3 Fatty Acids (FISH OIL) 1200 MG CAPS Take 1,200 mg by  mouth daily.     No facility-administered medications prior to visit.     Per HPI unless specifically indicated in ROS section below Review of Systems  Constitutional:  Negative for activity change, appetite change, chills, fatigue, fever and unexpected weight change.  HENT:  Negative for hearing loss.   Eyes:  Negative for visual disturbance.  Respiratory:  Positive for cough (morning cough). Negative for chest tightness, shortness of breath and wheezing.   Cardiovascular:  Negative for chest pain, palpitations and leg swelling.  Gastrointestinal:  Negative for abdominal  distention, abdominal pain, blood in stool, constipation, diarrhea, nausea and vomiting.  Genitourinary:  Negative for difficulty urinating and hematuria.  Musculoskeletal:  Positive for arthralgias (bilateral heel/ankle stiffness worse in the mornings). Negative for myalgias and neck pain.  Skin:  Negative for rash.  Neurological:  Negative for dizziness, seizures, syncope and headaches.  Hematological:  Negative for adenopathy. Does not bruise/bleed easily.  Psychiatric/Behavioral:  Negative for dysphoric mood. The patient is not nervous/anxious.     Objective:  BP 134/70   Pulse (!) 52   Temp (!) 97.3 F (36.3 C) (Temporal)   Ht 5' 6.5" (1.689 m)   Wt 207 lb 8 oz (94.1 kg)   SpO2 95%   BMI 32.99 kg/m   Wt Readings from Last 3 Encounters:  12/06/21 207 lb 8 oz (94.1 kg)  08/07/21 200 lb (90.7 kg)  07/24/21 200 lb (90.7 kg)      Physical Exam Vitals and nursing note reviewed.  Constitutional:      General: He is not in acute distress.    Appearance: Normal appearance. He is well-developed. He is not ill-appearing.  HENT:     Head: Normocephalic and atraumatic.     Right Ear: Hearing, tympanic membrane, ear canal and external ear normal.     Left Ear: Hearing, tympanic membrane, ear canal and external ear normal.  Eyes:     General: No scleral icterus.    Extraocular Movements: Extraocular movements intact.     Conjunctiva/sclera: Conjunctivae normal.     Pupils: Pupils are equal, round, and reactive to light.  Neck:     Thyroid: No thyroid mass or thyromegaly.     Vascular: No carotid bruit.  Cardiovascular:     Rate and Rhythm: Normal rate and regular rhythm.     Pulses: Normal pulses.          Radial pulses are 2+ on the right side and 2+ on the left side.     Heart sounds: Normal heart sounds. No murmur heard. Pulmonary:     Effort: Pulmonary effort is normal. No respiratory distress.     Breath sounds: Normal breath sounds. No wheezing, rhonchi or rales.   Abdominal:     General: Bowel sounds are normal. There is no distension.     Palpations: Abdomen is soft. There is no mass.     Tenderness: There is no abdominal tenderness. There is no guarding or rebound.     Hernia: No hernia is present.  Musculoskeletal:        General: Normal range of motion.     Cervical back: Normal range of motion and neck supple.     Right lower leg: No edema.     Left lower leg: No edema.  Lymphadenopathy:     Cervical: No cervical adenopathy.  Skin:    General: Skin is warm and dry.     Findings: No rash.  Neurological:     General:  No focal deficit present.     Mental Status: He is alert and oriented to person, place, and time.  Psychiatric:        Mood and Affect: Mood normal.        Behavior: Behavior normal.        Thought Content: Thought content normal.        Judgment: Judgment normal.       Results for orders placed or performed in visit on 11/29/21  PSA  Result Value Ref Range   PSA 0.89 0.10 - 4.00 ng/mL  TSH  Result Value Ref Range   TSH 2.86 0.35 - 5.50 uIU/mL  Comprehensive metabolic panel  Result Value Ref Range   Sodium 139 135 - 145 mEq/L   Potassium 4.5 3.5 - 5.1 mEq/L   Chloride 105 96 - 112 mEq/L   CO2 25 19 - 32 mEq/L   Glucose, Bld 98 70 - 99 mg/dL   BUN 18 6 - 23 mg/dL   Creatinine, Ser 1.22 0.40 - 1.50 mg/dL   Total Bilirubin 0.9 0.2 - 1.2 mg/dL   Alkaline Phosphatase 52 39 - 117 U/L   AST 19 0 - 37 U/L   ALT 29 0 - 53 U/L   Total Protein 6.8 6.0 - 8.3 g/dL   Albumin 4.3 3.5 - 5.2 g/dL   GFR 60.26 >60.00 mL/min   Calcium 9.5 8.4 - 10.5 mg/dL  Lipid panel  Result Value Ref Range   Cholesterol 133 0 - 200 mg/dL   Triglycerides 55.0 0.0 - 149.0 mg/dL   HDL 55.00 >39.00 mg/dL   VLDL 11.0 0.0 - 40.0 mg/dL   LDL Cholesterol 67 0 - 99 mg/dL   Total CHOL/HDL Ratio 2    NonHDL 78.10    EKG - sinus bradycardia rate 40s, normal axis, intervals, no hypertrophy or acute ST/T changes  Assessment & Plan:   Problem  List Items Addressed This Visit     Health maintenance examination - Primary (Chronic)    Preventative protocols reviewed and updated unless pt declined. Discussed healthy diet and lifestyle.       Advanced care planning/counseling discussion (Chronic)    Advanced directive - does not have set up. Would want wife then daughters to be HCPOA. Does not want prolonged life support if terminal condition. Packet previously provided.       Hypothyroidism    Chronic, stable on current regimen - continue.       Relevant Medications   SYNTHROID 100 MCG tablet   HLD (hyperlipidemia)    Chronic, stable on statin, zetia, fish oil.  The 10-year ASCVD risk score (Arnett DK, et al., 2019) is: 15.3%   Values used to calculate the score:     Age: 40 years     Sex: Male     Is Non-Hispanic African American: No     Diabetic: No     Tobacco smoker: No     Systolic Blood Pressure: 315 mmHg     Is BP treated: No     HDL Cholesterol: 55 mg/dL     Total Cholesterol: 133 mg/dL       Relevant Medications   atorvastatin (LIPITOR) 80 MG tablet   ASPIRIN 81 PO   ezetimibe (ZETIA) 10 MG tablet   Other Relevant Orders   EKG 12-Lead (Completed)   GERD (gastroesophageal reflux disease)    Chronic, stable on daily PPI, breakthrough symptoms if skipped dose.      Relevant Medications  omeprazole (PRILOSEC) 40 MG capsule   Obesity, Class I, BMI 30-34.9    Encourage healthy diet and lifestyle choices.       Atherosclerosis of aorta (HCC)    Continue aspirin, statin.       Relevant Medications   atorvastatin (LIPITOR) 80 MG tablet   ASPIRIN 81 PO   ezetimibe (ZETIA) 10 MG tablet   Other Relevant Orders   Ambulatory referral to Cardiology   Coronary artery calcification seen on CAT scan    Continue aspirin, statin. Update EKG. Pt asxs.       Relevant Medications   atorvastatin (LIPITOR) 80 MG tablet   ASPIRIN 81 PO   ezetimibe (ZETIA) 10 MG tablet   Other Relevant Orders   EKG 12-Lead  (Completed)   Ambulatory referral to Cardiology   Habitual alcohol use    Discussed alcohol intake, encouraged decrease intake.      Cancer of distal transverse colon pT2, pN0 s/p lap colectomy 09/05/2020    H/o this, continues regular f/u with gen surg and GI.       Relevant Medications   ASPIRIN 81 PO   S/P laparoscopic-assisted sigmoidectomy   Sinus bradycardia by electrocardiogram    Marked bradycardia 40s by EKG - pt largely asymptomatic. Will refer back to cardiology as overdue for f/u.       Relevant Orders   Ambulatory referral to Cardiology     Meds ordered this encounter  Medications   atorvastatin (LIPITOR) 80 MG tablet    Sig: Take 1 tablet (80 mg total) by mouth daily.    Dispense:  90 tablet    Refill:  3   SYNTHROID 100 MCG tablet    Sig: Take 1 tablet (100 mcg total) by mouth daily before breakfast.    Dispense:  90 tablet    Refill:  3   omeprazole (PRILOSEC) 40 MG capsule    Sig: Take 1 capsule (40 mg total) by mouth daily.    Dispense:  90 capsule    Refill:  3   ezetimibe (ZETIA) 10 MG tablet    Sig: Take 1 tablet (10 mg total) by mouth daily.    Dispense:  90 tablet    Refill:  3   Orders Placed This Encounter  Procedures   Ambulatory referral to Cardiology    Referral Priority:   Routine    Referral Type:   Consultation    Referral Reason:   Specialty Services Required    Requested Specialty:   Cardiology    Number of Visits Requested:   1   EKG 12-Lead     Patient instructions: Update EKG today Medicines refilled today Earwax removed today.  Good to see you today Return as needed or in 1 year for next physical  Follow up plan: Return in about 1 year (around 12/07/2022) for annual exam, prior fasting for blood work.  Ria Bush, MD

## 2021-12-06 NOTE — Assessment & Plan Note (Signed)
Chronic, stable on statin, zetia, fish oil.  The 10-year ASCVD risk score (Arnett DK, et al., 2019) is: 15.3%   Values used to calculate the score:     Age: 71 years     Sex: Male     Is Non-Hispanic African American: No     Diabetic: No     Tobacco smoker: No     Systolic Blood Pressure: 161 mmHg     Is BP treated: No     HDL Cholesterol: 55 mg/dL     Total Cholesterol: 133 mg/dL

## 2021-12-06 NOTE — Assessment & Plan Note (Signed)
Chronic, stable on daily PPI, breakthrough symptoms if skipped dose.

## 2021-12-06 NOTE — Assessment & Plan Note (Addendum)
Discussed alcohol intake, encouraged decrease intake.

## 2021-12-06 NOTE — Assessment & Plan Note (Signed)
Encourage healthy diet and lifestyle choices.

## 2021-12-06 NOTE — Assessment & Plan Note (Signed)
Preventative protocols reviewed and updated unless pt declined. Discussed healthy diet and lifestyle.  

## 2021-12-10 ENCOUNTER — Encounter (HOSPITAL_BASED_OUTPATIENT_CLINIC_OR_DEPARTMENT_OTHER): Payer: Self-pay | Admitting: Family

## 2021-12-10 ENCOUNTER — Ambulatory Visit (HOSPITAL_BASED_OUTPATIENT_CLINIC_OR_DEPARTMENT_OTHER): Payer: BC Managed Care – PPO | Admitting: Family

## 2021-12-10 ENCOUNTER — Ambulatory Visit (INDEPENDENT_AMBULATORY_CARE_PROVIDER_SITE_OTHER): Payer: BC Managed Care – PPO

## 2021-12-10 VITALS — BP 148/72 | HR 67 | Ht 66.5 in | Wt 211.0 lb

## 2021-12-10 DIAGNOSIS — R0683 Snoring: Secondary | ICD-10-CM

## 2021-12-10 DIAGNOSIS — R001 Bradycardia, unspecified: Secondary | ICD-10-CM | POA: Diagnosis not present

## 2021-12-10 DIAGNOSIS — E782 Mixed hyperlipidemia: Secondary | ICD-10-CM

## 2021-12-10 DIAGNOSIS — G4730 Sleep apnea, unspecified: Secondary | ICD-10-CM | POA: Diagnosis not present

## 2021-12-10 IMAGING — CT CT ABD-PELV W/ CM
2 of 6 series · 14 of 36 positions shown, 17 images · IV contrast (Omnipaque)
Comparison: CT chest 06/26/2017

CLINICAL DATA: Colorectal carcinoma staging

EXAM:
CT CHEST, ABDOMEN, AND PELVIS WITH CONTRAST
TECHNIQUE: Multidetector CT imaging of the chest, abdomen and pelvis was
performed following the standard protocol during bolus
administration of intravenous contrast.
CONTRAST:  100mL OMNIPAQUE IOHEXOL 300 MG/ML  SOLN

[Series 4: coronals · coronal · 0.86mm/px · 3 of 151 slices shown]
[im 31/151  lung]
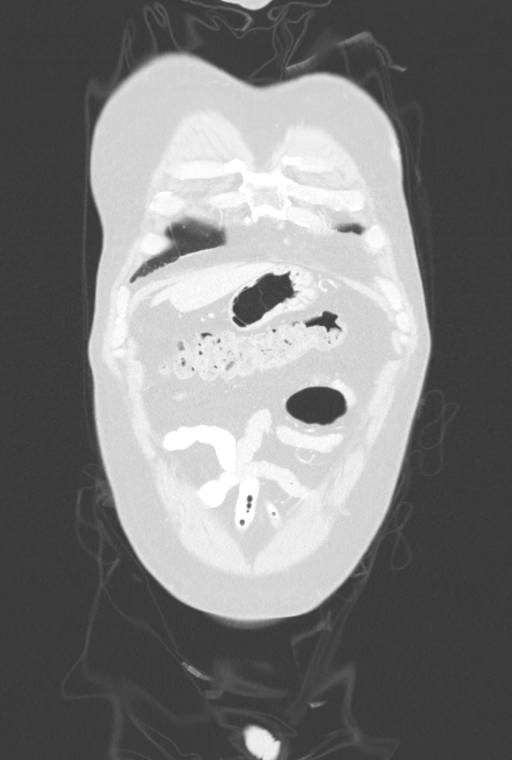
[im 61/151  lung]
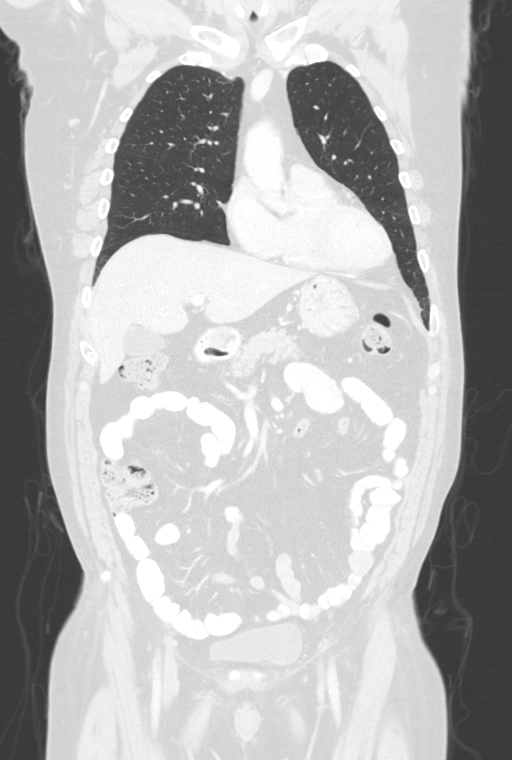
[im 91/151  lung]
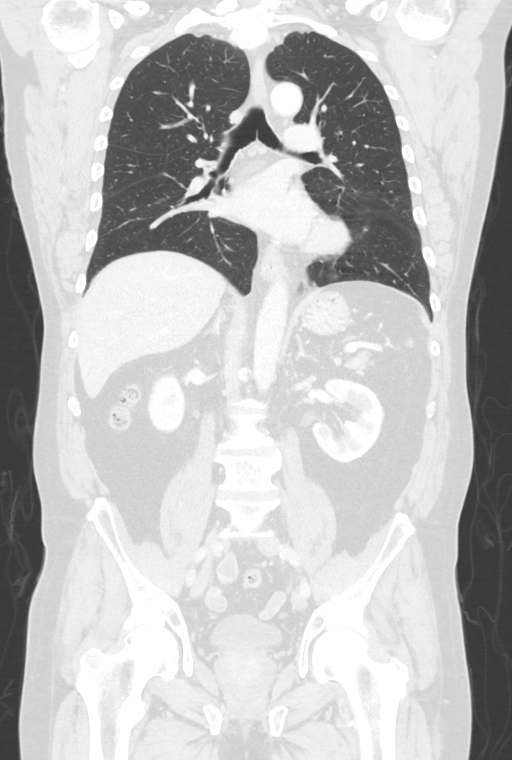

[Series 6: thins · axial · 0.79mm/px · z∈[+650,+1244]mm · 11 of 945 slices shown, 14 images]
[im 48/945  mediastinal]
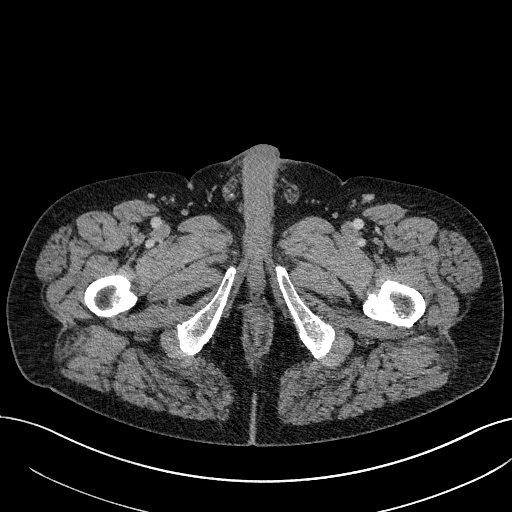
[im 48/945  lung]
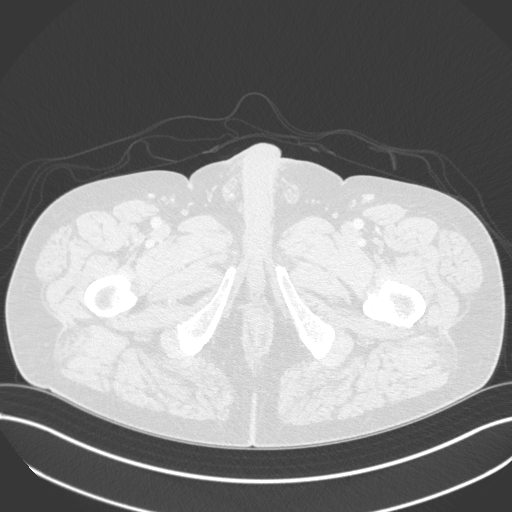
[im 142/945  lung]
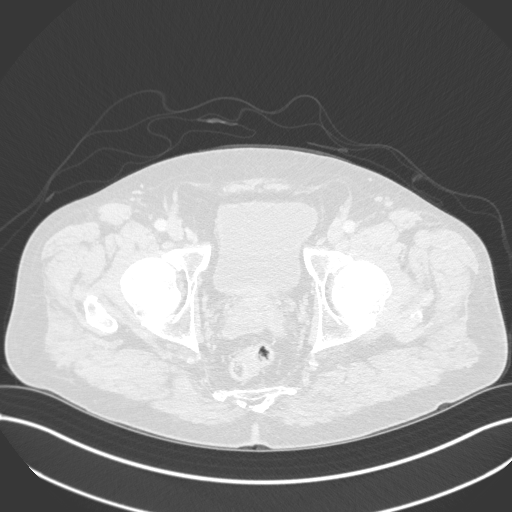
[im 237/945  lung]
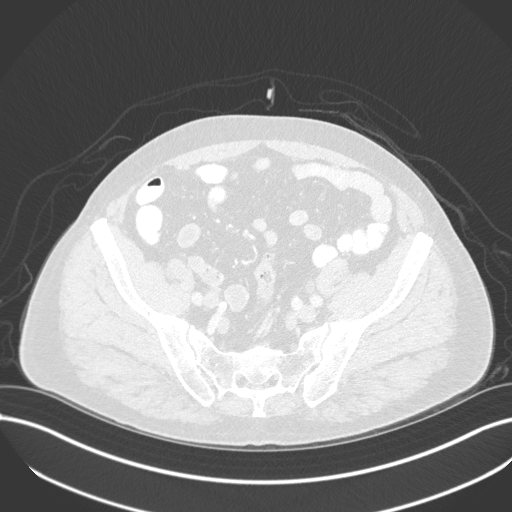
[im 331/945  lung]
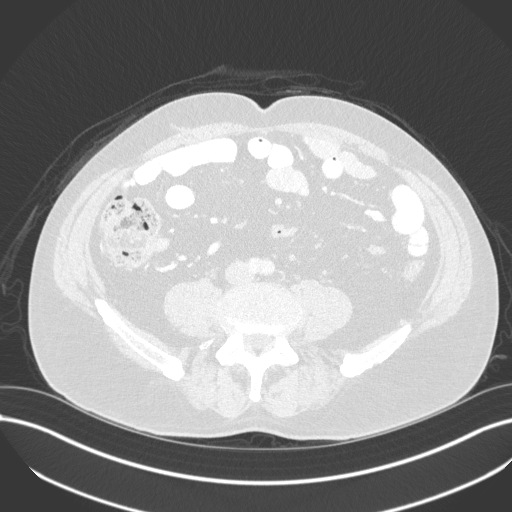
[im 378/945  mediastinal]
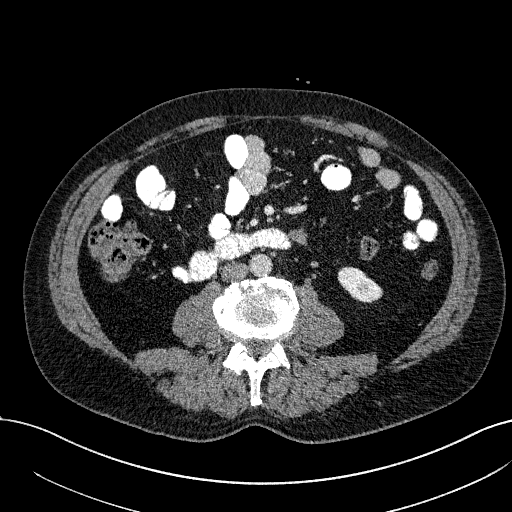
[im 378/945  lung]
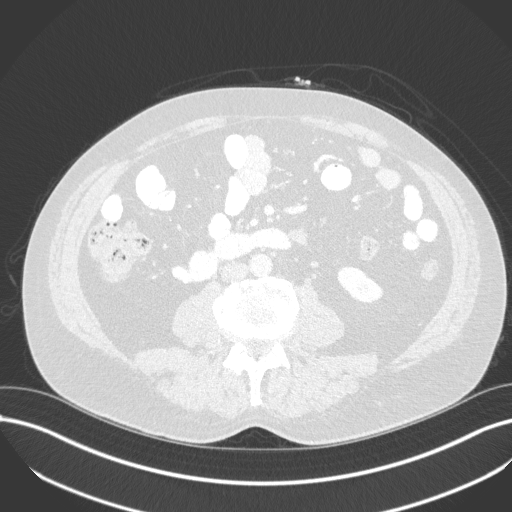
[im 473/945  lung]
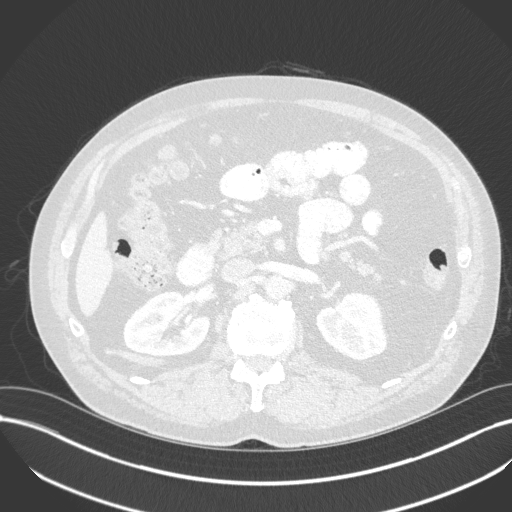
[im 567/945  lung]
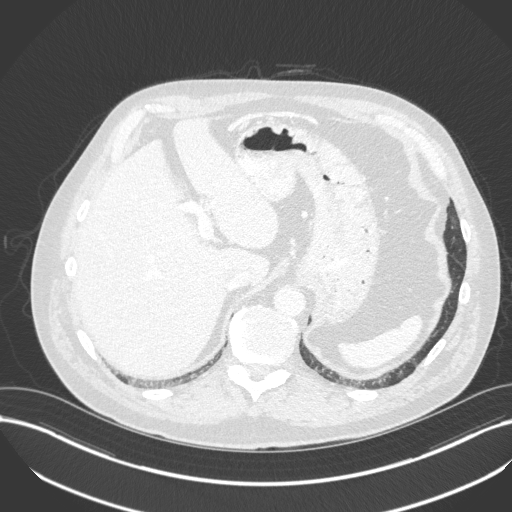
[im 614/945  lung]
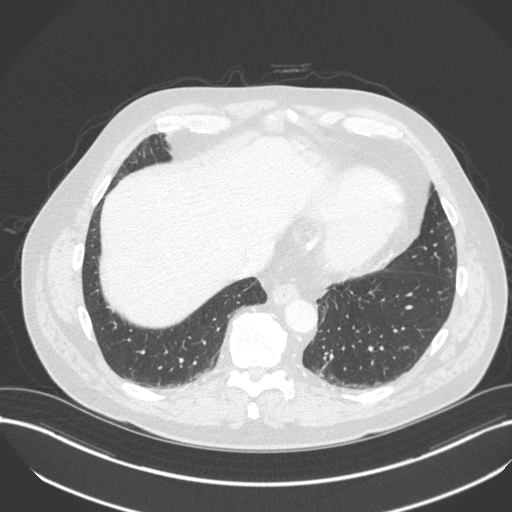
[im 709/945  mediastinal]
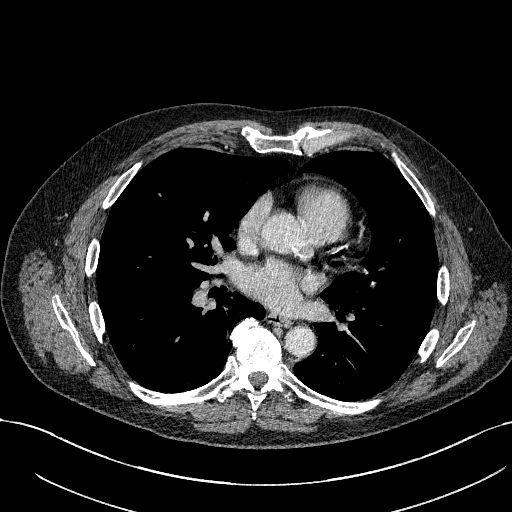
[im 709/945  lung]
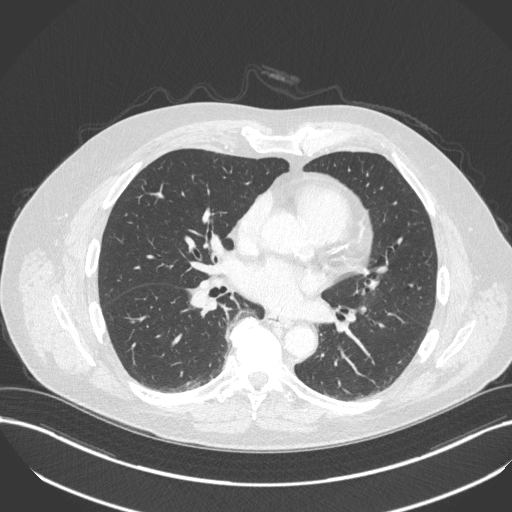
[im 803/945  lung]
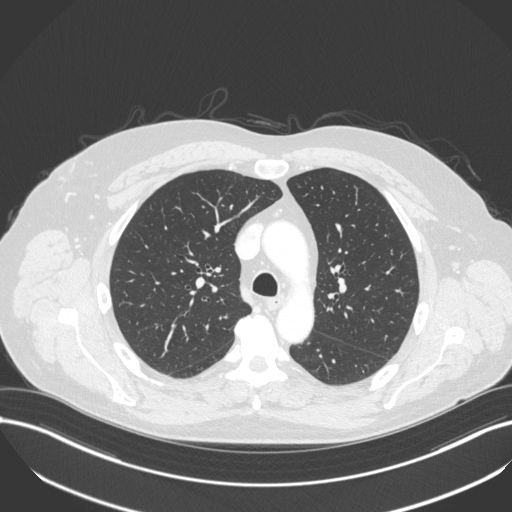
[im 897/945  lung]
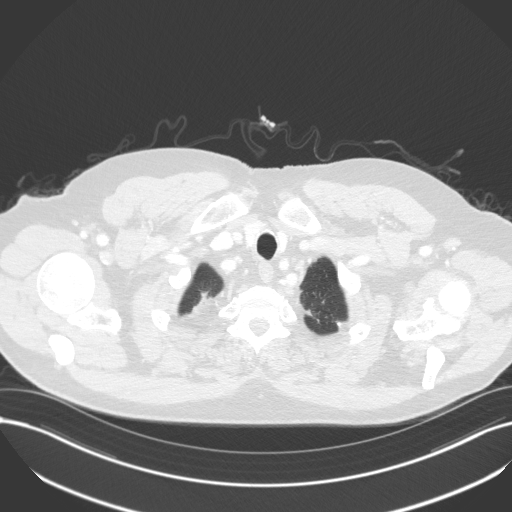

[14 of 36 positions shown; findings below may reference images not displayed]

FINDINGS: CT CHEST FINDINGS

Cardiovascular: Normal heart size. No pericardial effusion. Aortic
atherosclerosis. Coronary artery calcifications.

Mediastinum/Nodes: No enlarged mediastinal, hilar, or axillary lymph
nodes. Thyroid gland, trachea, and esophagus demonstrate no
significant findings.

Lungs/Pleura: No pleural effusion, airspace consolidation or
atelectasis. Perifissural nodules along the major fissure of the
right lung likely reflect benign intrapulmonary lymph nodes. These
were present on study from 06/26/2017. No suspicious lung nodules
identified.

Musculoskeletal: Spondylosis identified within the thoracic spine.
Remote healed deformity involving the left clavicle. No acute or
suspicious osseous findings

CT ABDOMEN PELVIS FINDINGS

Hepatobiliary: 5 mm low-attenuation structure in segment 2 is
unchanged from previous exam and likely represents a small cyst. No
suspicious liver lesions. Gallbladder normal.

Pancreas: Unremarkable. No pancreatic ductal dilatation or
surrounding inflammatory changes.

Spleen: Normal in size without focal abnormality.

Adrenals/Urinary Tract: Normal appearance of the adrenal glands. No
kidney mass or hydronephrosis. Urinary bladder is unremarkable.

Stomach/Bowel: Stomach is within normal limits. Appendix appears
normal. No evidence of bowel wall thickening, distention, or
inflammatory changes. No obstructing colon mass identified.

Vascular/Lymphatic: Aortic atherosclerosis without aneurysm. No
abdominopelvic adenopathy.

Reproductive: Prostate gland measures 5.4 x 4.0 by 5.4 cm (volume =
61 cm^3).

Other: No free fluid or fluid collections. No peritoneal nodule or
mass. Fat containing inguinal hernias are identified, left greater
than right.

Musculoskeletal: Degenerative disc disease identified within the
lumbar spine. No acute or suspicious osseous findings.
IMPRESSION: 1. No findings identified to suggest metastatic disease to the
chest, abdomen or pelvis.
2. Aortic atherosclerosis and coronary artery calcifications.
3. Prostate gland enlargement.
4. Fat containing inguinal hernias, left greater than right.

Aortic Atherosclerosis ([id number]-JE8.8).

## 2021-12-10 NOTE — Patient Instructions (Signed)
Medication Instructions:  Your Physician recommend you continue on your current medication as directed.    *If you need a refill on your cardiac medications before your next appointment, please call your pharmacy*   Lab Work: None ordered today   Testing/Procedures: WatchPAT?  Is a FDA cleared portable home sleep study test that uses a watch and 3 points of contact to monitor 7 different channels, including your heart rate, oxygen saturations, body position, snoring, and chest motion.  The study is easy to use from the comfort of your own home and accurately detect sleep apnea.  Before bed, you attach the chest sensor, attached the sleep apnea bracelet to your nondominant hand, and attach the finger probe.  After the study, the raw data is downloaded from the watch and scored for apnea events.   For more information: [email or web address]  Patient Testing Instructions:  Do not put battery into the device until bedtime when you are ready to begin the test. Please call the support number if you need assistance after following the instructions below: 24 hour support line- [phone] or [facility] support at [phone] (option 2)  Download the [employer] American[employer]PAT One" app through the google play store or App Store  Be sure to turn on or enable access to bluetooth in settlings on your smartphone/ device  Make sure no other bluetooth devices are on and within the vicinity of your smartphone/ device and WatchPAT watch during testing.  Make sure to leave your smart phone/ device plugged in and charging all night.  When ready for bed:  Follow the instructions step by step in the WatchPAT One App to activate the testing device. For additional instructions, including video instruction, visit the WatchPAT One video on Youtube. You can search for [place] One within Youtube (video is 4 minutes and 18 seconds) or enter: https://youtube/watch?v=BCce_vbiwxE Please note: You will be prompted to  enter a Pin to connect via bluetooth when starting the test. The PIN will be assigned to you when you receive the test.  The device is disposable, but it recommended that you retain the device until you receive a call letting you know the study has been received and the results have been interpreted.  We will let you know if the study did not transmit to [place] properly after the test is completed. You do not need to call us to confirm the receipt of the test.  Please complete the test within 48 hours of receiving PIN.   Frequently Asked Questions:  What is Watch [place] one?  A single use fully disposable home sleep apnea testing device and will not need to be returned after completion.  What are the requirements to use WatchPAT one?  The be able to have a successful watchpat one sleep study, you should have your Watch pat one device, your smart phone, watch pat one app, your PIN number and Internet access What type of phone do I need?  You should have a smart phone that uses Android 5.1 and above or any Iphone with IOS 10 and above How can I download the WatchPAT one app?  Based on your device type search for WatchPAT one app either in google play for android devices or APP store for Iphone's Where will I get my PIN for the study?  Your PIN will be provided by your physician's office. It is used for authentication and if you lose/forget your PIN, please reach out to your providers office.  I do not have Internet at home.  Can I do WatchPAT one study?  WatchPAT One needs Internet connection throughout the night to be able to transmit the sleep data. You can use your home/local internet or your cellular's data package. However, it is always recommended to use home/local Internet. It is estimated that between 20MB-30MB will be used with each study.However, the application will be looking for 80MB space in the phone to start the study.  What happens if I lose internet or bluetooth connection?  During the  internet disconnection, your phone will not be able to transmit the sleep data. All the data, will be stored in your phone. As soon as the internet connection is back on, the phone will being sending the sleep data. During the bluetooth disconnection, WatchPAT one will not be able to to send the sleep data to your phone. Data will be kept in the [facility] one until two devices have bluetooth connection back on. As soon as the connection is back on, WatchPAT one will send the sleep data to the phone.  How long do I need to wear the WatchPAT one?  After you start the study, you should wear the device at least 6 hours.  How far should I keep my phone from the device?  During the night, your phone should be within 15 feet.  What happens if I leave the room for restroom or other reasons?  Leaving the room for any reason will not cause any problem. As soon as your get back to the room, both devices will reconnect and will continue to send the sleep data. Can I use my phone during the sleep study?  Yes, you can use your phone as usual during the study. But it is recommended to put your watchpat one on when you are ready to go to bed.  How will I get my study results?  A soon as you completed your study, your sleep data will be sent to the provider. They will then share the results with you when they are ready.     Follow-Up: At [facility], you and your health needs are our priority.  As part of our continuing mission to provide you with exceptional heart care, we have created designated Provider Care Teams.  These Care Teams include your primary Cardiologist (physician) and Advanced Practice Providers (APPs -  Physician Assistants and Nurse Practitioners) who all work together to provide you with the care you need, when you need it.  We recommend signing up for the patient portal called "MyChart".  Sign up information is provided on this After Visit Summary.  MyChart is used to connect with patients  for Virtual Visits (Telemedicine).  Patients are able to view lab/test results, encounter notes, upcoming appointments, etc.  Non-urgent messages can be sent to your provider as well.   To learn more about what you can do with MyChart, go to [email or web address].    Your next appointment:   6 week(s)  The format for your next appointment:   In Person  Provider:   Fransico Him, MD  or Laurann Montana, NP {  Other Instructions Your physician has recommended that you wear a Zio monitor.   This monitor is a medical device that records the heart's electrical activity. Doctors most often use these monitors to diagnose arrhythmias. Arrhythmias are problems with the speed or rhythm of the heartbeat. The monitor is a small device applied to your chest. You can wear one while you do your normal daily activities. While wearing this monitor  if you have any symptoms to push the button and record what you felt. Once you have worn this monitor for the period of time provider prescribed (Usually 14 days), you will return the monitor device in the postage paid box. Once it is returned they will download the data collected and provide [place] with a report which the provider will then review and we will call you with those results. Important tips:  Avoid showering during the first 24 hours of wearing the monitor. Avoid excessive sweating to help maximize wear time. Do not submerge the device, no hot tubs, and no swimming pools. Keep any lotions or oils away from the patch. After 24 hours you may shower with the patch on. Take brief showers with your back facing the shower head.  Do not remove patch once it has been placed because that will interrupt data and decrease adhesive wear time. Push the button when you have any symptoms and write down what you were feeling. Once you have completed wearing your monitor, remove and place into box which has postage paid and place in your outgoing mailbox.  If for some  reason you have misplaced your box then call our office and we can provide another box and/or mail it off for you.

## 2021-12-10 NOTE — Progress Notes (Signed)
Office Visit    Patient Name: Caleb Smith Date of Encounter: 12/10/2021  PCP:  Ria Bush, [facility]  Cardiologist:  Fransico Him, MD  Advanced Practice Provider:  No care team member to display Electrophysiologist:  None     Chief Complaint    Caleb Smith is a 71 y.o. male with a hx of hypothyroidism, hyperlipidemia, GERD, obesity, aortic atherosclerosis, CAD presents today for bradycardia  Past Medical History    Past Medical History:  Diagnosis Date   Arthritis    Colon cancer ([place])    Coronary artery calcification seen on CAT scan 08/21/2017   COVID-19 virus infection 07/2020   GERD (gastroesophageal reflux disease) 1995   s/p esoph dilation for stricture   HLD (hyperlipidemia)    Hypothyroidism    Obesity 07/14/2014   Past Surgical History:  Procedure Laterality Date   CARDIOVASCULAR STRESS TEST  07/2017   low risk study ([facility])   COLON RESECTION  09/05/2020   Procedure: LAPAROSCOPIC  SEGMENTAL COLECTOMY, TAKE DOWN OF SPLEENIC FLEXURE;  Surgeon: Ileana Roup, MD;  Location: [facility];  Service: General;;   COLONOSCOPY  03/2003   small sigmoid polyp, rpt 5 yrs [name])   COLONOSCOPY  07/2021   polyps, hemorrhoids, congested scar biopsied, single diverticula ([place])   COLONOSCOPY WITH PROPOFOL N/A 02/14/2020   SSP Bonna Gains, Varnita B, MD)   COLONOSCOPY WITH PROPOFOL N/A 06/07/2020   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 ([place], Telford Nab., MD)   ENDOSCOPIC MUCOSAL RESECTION N/A 06/07/2020   Procedure: ENDOSCOPIC MUCOSAL RESECTION;  Surgeon: Irving Copas., MD;  Location: [facility] ENDOSCOPY;  Service: Gastroenterology;  Laterality: N/A;   ESOPHAGOGASTRODUODENOSCOPY  1995   s/p esophageal dilation   FACIAL RECONSTRUCTION SURGERY  1990s   cow headbutted him   FINGER SURGERY Left teenager   reattachment of 2nd,3rd,4th   HEMOSTASIS CLIP PLACEMENT  06/07/2020    Procedure: HEMOSTASIS CLIP PLACEMENT;  Surgeon: Irving Copas., MD;  Location: [place];  Service: Gastroenterology;;   POLYPECTOMY  06/07/2020   Procedure: POLYPECTOMY;  Surgeon: Irving Copas., MD;  Location: [place];  Service: Gastroenterology;;   SUBMUCOSAL LIFTING INJECTION  06/07/2020   Procedure: SUBMUCOSAL LIFTING INJECTION;  Surgeon: Irving Copas., MD;  Location: [place];  Service: Gastroenterology;;   TONSILLECTOMY     TOTAL KNEE ARTHROPLASTY Right 08/09/2019   TOTAL KNEE ARTHROPLASTY Right 08/09/2019   Procedure: RIGHT TOTAL KNEE ARTHROPLASTY-CEMENTED;  Surgeon: Meredith Pel, MD;  Location: [place];  Service: Orthopedics;  Laterality: Right;    Allergies  No Known Allergies  History of Present Illness    Caleb Smith is a 71 y.o. male with a hx of hypothyroidism, hyperlipidemia, GERD, obesity, aortic atherosclerosis, CAD last seen 02/10/19.  Prior CT with 3 vessel coronary calcification. Had myoview 2019 which was low risk study. Last seen 01/2019 by Dr. Radford Pax doing well from a cardiovascular perspective.   He saw primary care 12/06/21 for annual visit noted to be bradycardic recommended for cardiology follow up.   He presents today for follow-up with his wife. 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 He was able to bale hay Saturday without chest pain nor dyspnea. His wife does share that he snores and she witnesses him stop breathing. No prior sleep study. Did sleep quality report through her employer, [employer], which we will scan to chart. Notes sAHI4% 12 (  mild) and sAHI3% 24 (moderate) sleep apnea.  They are aware will likely have to repeat through insurance. Reports no shortness of breath nor dyspnea on exertion. Reports no chest pain, pressure, or tightness. No edema, orthopnea, PND. Reports no palpitations.    EKGs/Labs/Other Studies Reviewed:   The following studies were  reviewed today:  Myoview 08/2017 Nuclear stress EF: 61%. Blood pressure demonstrated a hypertensive response to exercise. ST segment depression was noted during stress. The study is normal. This is a low risk study. The left ventricular ejection fraction is normal (55-65%).   Low risk stress nuclear study with no ischemia or infarction; note positive ST changes with exercise; EF 61 with normal wall motion.   EKG:  No EKG is ordered today.  The ekg independently reviewed from 12/06/21 demonstrated SB 43 bpm with no acute ST/T wave chnages.   Recent Labs: 11/29/2021: ALT 29; BUN 18; Creatinine, Ser 1.22; Potassium 4.5; Sodium 139; TSH 2.86  Recent Lipid Panel    Component Value Date/Time   CHOL 133 11/29/2021 0724   CHOL 141 02/05/2018 0736   CHOL 204 07/17/2014 0000   TRIG 55.0 11/29/2021 0724   TRIG 58 07/17/2014 0000   HDL 55.00 11/29/2021 0724   HDL 61 02/05/2018 0736   CHOLHDL 2 11/29/2021 0724   VLDL 11.0 11/29/2021 0724   LDLCALC 67 11/29/2021 0724   LDLCALC 67 02/05/2018 0736   LDLCALC 133 07/17/2014 0000   Home Medications   Current Meds  Medication Sig   ASPIRIN 81 PO Take 1 tablet by mouth daily.   atorvastatin (LIPITOR) 80 MG tablet Take 1 tablet (80 mg total) by mouth daily.   Cholecalciferol (VITAMIN D3) 1.25 MG (50000 UT) CAPS Take 1 capsule by mouth daily.   ezetimibe (ZETIA) 10 MG tablet Take 1 tablet (10 mg total) by mouth daily.   omeprazole (PRILOSEC) 40 MG capsule Take 1 capsule (40 mg total) by mouth daily.   SYNTHROID 100 MCG tablet Take 1 tablet (100 mcg total) by mouth daily before breakfast.   vitamin B-12 (CYANOCOBALAMIN) 1000 MCG tablet Take 1,000 mcg by mouth daily.     Review of Systems      All other systems reviewed and are otherwise negative except as noted above.  Physical Exam    VS:  BP (!) 148/72   Pulse 67   Ht 5' 6.5" (1.689 m)   Wt 211 lb (95.7 kg)   BMI 33.55 kg/m  , BMI Body mass index is 33.55 kg/m.  Wt Readings from Last  3 Encounters:  12/10/21 211 lb (95.7 kg)  12/06/21 207 lb 8 oz (94.1 kg)  08/07/21 200 lb (90.7 kg)     GEN: Well nourished, overweight, well developed, in no acute distress. HEENT: normal. Neck: Supple, no JVD, carotid bruits, or masses. Cardiac: RRR, no murmurs, rubs, or gallops. No clubbing, cyanosis, edema.  Radials/PT 2+ and equal bilaterally.  Respiratory:  Respirations regular and unlabored, clear to auscultation bilaterally. GI: Soft, nontender, nondistended. MS: No deformity or atrophy. Skin: Warm and dry, no rash. Neuro:  Strength and sensation are intact. Psych: Normal affect.  Assessment & Plan    Bradycardia -11/29/2021 CMP with normal electrolytes. EKG from 12/06/21 with PCP SB 43 bpm with no acute ST/T wave changes. Asymptomatic with no lightheadedness, dizziness, near syncope, syncope. 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 However, 7 day ZIO placed in clinic to rule out AV block  or pause and sleep study to rule out OSA as contributory.   Snores/Sleep disordered breathing - Stop Bang score 5. Wife works for [employer] and he did unofficial sleep study through them last night with sAHI4% 12 (mild) and sAHI3% 24 (moderate) sleep apnea. They understand will require official study through insurance. Snores and wife has witnessed apneic episodes. Itamar home sleep study provided in clinic today. Discussed that if mild to moderate likely AutoPAP but if severe anticipate in lab sleep study.   Hyperlipidemia -11/29/2021 total cholesterol 133, triglycerides 55, HDL 55, LDL 67, AST 19, ALT 29. LDL at goal of less than 70, continue Atorvastatin and Zetia.   CAD -  Prior CT with coronary calcification. Stress test 2019 low risk study. Stable with no anginal symptoms. No indication for ischemic evaluation.  GDMT includes Atorvastatin, Zetia, Aspirin. No BB due to bradycardia. Heart healthy diet and  regular cardiovascular exercise encouraged.    Hypothyroidism -11/29/2021 TSH 2.86. Levothyroxine managed by primary care.   Obesity - Weight loss via diet and exercise encouraged. Discussed the impact being overweight would have on cardiovascular risk. 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   Disposition: Follow up in 6 week(s) with Fransico Him, MD or APP.  Signed, Loel Dubonnet, NP 12/10/2021, 8:59 AM [place]

## 2021-12-17 ENCOUNTER — Telehealth: Payer: Self-pay | Admitting: *Deleted

## 2021-12-17 ENCOUNTER — Encounter (INDEPENDENT_AMBULATORY_CARE_PROVIDER_SITE_OTHER): Payer: BC Managed Care – PPO | Admitting: Cardiology

## 2021-12-17 DIAGNOSIS — G4733 Obstructive sleep apnea (adult) (pediatric): Secondary | ICD-10-CM | POA: Diagnosis not present

## 2021-12-17 NOTE — Telephone Encounter (Signed)
Advised patient and [id number] code given to patient

## 2021-12-17 NOTE — Telephone Encounter (Signed)
Message sent to both Caleb Smith and Caleb Smith ok to activate itamar. Daphene Jaeger is offline. Unable to send her a message.

## 2021-12-18 NOTE — Procedures (Signed)
   SLEEP STUDY REPORT Patient Information Study Date: 12/17/21 Patient Name: Caleb Smith Patient ID: [id number] Birth Date: 02-18-2051 Age: 71 Gender: Male BMI: 34.0 (W=211 lb, H=5' 6'') Referring Physician: Laurann Montana, NP  TEST DESCRIPTION: Home sleep apnea testing was completed using the WatchPat, a Type 1 device, utilizing  peripheral arterial tonometry (PAT), chest movement, actigraphy, pulse oximetry, pulse rate, body position and snore.  AHI was calculated with apnea and hypopnea using valid sleep time as the denominator. RDI includes apneas,  hypopneas, and RERAs. The data acquired and the scoring of sleep and all associated events were performed in  accordance with the recommended standards and specifications as outlined in the AASM Manual for the Scoring of  Sleep and Associated Events 2.2.0 (2015).  FINDINGS: 1. Moderate Obstructive Sleep Apnea with AHI 25/hr.  2. No significant Central Sleep Apnea with pAHIc 2/hr. 3. Oxygen desaturations as low as82 %. 4. Moderate snoring was present. O2 sats were < 88% for 7.7 min. 5. Total sleep time was 8 hrs and 8 min. 6. 27.4% of total sleep time was spent in REM sleep.  7. Shortened sleep onset latency at 6 min 8. Normal REM sleep onset latency at 19mn 9. Total awakenings were 8.   DIAGNOSIS:  Moderate Obstructive Sleep Apnea (G47.33) Nocturnal Hypoxemia  RECOMMENDATIONS: 1. Clinical correlation of these findings is necessary. The decision to treat obstructive sleep apnea (OSA) is usually  based on the presence of apnea symptoms or the presence of associated medical conditions such as Hypertension,  Congestive Heart Failure, Atrial Fibrillation or Obesity. The most common symptoms of OSA are snoring, gasping for  breath while sleeping, daytime sleepiness and fatigue.   2. Initiating apnea therapy is recommended given the presence of symptoms and/or associated conditions.  Recommend proceeding with one of the following:    a. Auto-CPAP therapy with a pressure range of 5-20cm H2O.   b. An oral appliance (OA) that can be obtained from certain dentists with expertise in sleep medicine. These are  primarily of use in non-obese patients with mild and moderate disease.   c. An ENT consultation which may be useful to look for specific causes of obstruction and possible treatment  options.   d. If patient is intolerant to PAP therapy, consider referral to ENT for evaluation for hypoglossal nerve stimulator.   3. Close follow-up is necessary to ensure success with CPAP or oral appliance therapy for maximum benefit .  4. A follow-up oximetry study on CPAP is recommended to assess the adequacy of therapy and determine the need  for supplemental oxygen or the potential need for Bi-level therapy. An arterial blood gas to determine the adequacy of  baseline ventilation and oxygenation should also be considered.  5. Healthy sleep recommendations include: adequate nightly sleep (normal 7-9 hrs/night), avoidance of caffeine after  noon and alcohol near bedtime, and maintaining a sleep environment that is cool, dark and quiet.  6. Weight loss for overweight patients is recommended. Even modest amounts of weight loss can significantly  improve the severity of sleep apnea.  7. Snoring recommendations include: weight loss where appropriate, side sleeping, and avoidance of alcohol before  bed.  8. Operation of motor vehicle should not be performed when sleepy.  Signature: Electronically Signed: 12/18/21 TFransico Him MD; [facility] [place] American Board of Sleep Medicine

## 2021-12-19 ENCOUNTER — Ambulatory Visit: Payer: BC Managed Care – PPO

## 2021-12-19 DIAGNOSIS — G4730 Sleep apnea, unspecified: Secondary | ICD-10-CM

## 2021-12-19 DIAGNOSIS — R0683 Snoring: Secondary | ICD-10-CM

## 2021-12-19 DIAGNOSIS — R001 Bradycardia, unspecified: Secondary | ICD-10-CM | POA: Diagnosis not present

## 2021-12-20 ENCOUNTER — Encounter (HOSPITAL_BASED_OUTPATIENT_CLINIC_OR_DEPARTMENT_OTHER): Payer: Self-pay

## 2021-12-23 ENCOUNTER — Encounter (HOSPITAL_BASED_OUTPATIENT_CLINIC_OR_DEPARTMENT_OTHER): Payer: Self-pay

## 2021-12-27 ENCOUNTER — Other Ambulatory Visit: Payer: Self-pay | Admitting: Cardiology

## 2021-12-27 ENCOUNTER — Telehealth: Payer: Self-pay | Admitting: *Deleted

## 2021-12-27 DIAGNOSIS — G4733 Obstructive sleep apnea (adult) (pediatric): Secondary | ICD-10-CM

## 2021-12-27 DIAGNOSIS — G4736 Sleep related hypoventilation in conditions classified elsewhere: Secondary | ICD-10-CM

## 2021-12-27 NOTE — Telephone Encounter (Signed)
-----   Message from Sueanne Margarita, MD sent at 12/18/2021  3:01 PM EDT ----- Please let patient know that they have sleep apnea.  Recommend therapeutic CPAP titration for treatment of patient's sleep disordered breathing.  If unable to perform an in lab titration then initiate ResMed auto CPAP from 4 to 15cm H2O with heated humidity and mask of choice and overnight pulse ox on CPAP.

## 2021-12-27 NOTE — Telephone Encounter (Signed)
Patient notified of HST results and recommendations. He agrees to proceed with CPAP titration study pending BCBS approval. He has no questions at this time.

## 2021-12-27 NOTE — Telephone Encounter (Signed)
Prior Authorization for CPAP titration sent to [facility] via web portal. Per web portal no PA is required.

## 2022-01-21 ENCOUNTER — Encounter (HOSPITAL_BASED_OUTPATIENT_CLINIC_OR_DEPARTMENT_OTHER): Payer: Self-pay | Admitting: Family

## 2022-01-21 ENCOUNTER — Ambulatory Visit (HOSPITAL_BASED_OUTPATIENT_CLINIC_OR_DEPARTMENT_OTHER): Payer: BC Managed Care – PPO | Admitting: Family

## 2022-01-21 VITALS — BP 126/68 | HR 72 | Ht 66.5 in | Wt 203.0 lb

## 2022-01-21 DIAGNOSIS — R001 Bradycardia, unspecified: Secondary | ICD-10-CM

## 2022-01-21 DIAGNOSIS — G4733 Obstructive sleep apnea (adult) (pediatric): Secondary | ICD-10-CM

## 2022-01-21 DIAGNOSIS — I25118 Atherosclerotic heart disease of native coronary artery with other forms of angina pectoris: Secondary | ICD-10-CM | POA: Diagnosis not present

## 2022-01-21 DIAGNOSIS — E785 Hyperlipidemia, unspecified: Secondary | ICD-10-CM

## 2022-01-21 NOTE — Progress Notes (Signed)
Office Visit    Patient Name: Caleb Smith Date of Encounter: 01/21/2022  PCP:  Ria Bush, [facility]  Cardiologist:  Fransico Him, MD  Advanced Practice Provider:  No care team member to display Electrophysiologist:  None     Chief Complaint    Caleb Smith is a 71 y.o. male with a hx of hypothyroidism, hyperlipidemia, GERD, obesity, aortic atherosclerosis, CAD presents today for follow up after monitor   Past Medical History    Past Medical History:  Diagnosis Date   Arthritis    Colon cancer ([place])    Coronary artery calcification seen on CAT scan 08/21/2017   COVID-19 virus infection 07/2020   GERD (gastroesophageal reflux disease) 1995   s/p esoph dilation for stricture   HLD (hyperlipidemia)    Hypothyroidism    Obesity 07/14/2014   Past Surgical History:  Procedure Laterality Date   CARDIOVASCULAR STRESS TEST  07/2017   low risk study ([facility])   COLON RESECTION  09/05/2020   Procedure: LAPAROSCOPIC  SEGMENTAL COLECTOMY, TAKE DOWN OF SPLEENIC FLEXURE;  Surgeon: Ileana Roup, MD;  [facility];  Service: General;;   COLONOSCOPY  03/2003   small sigmoid polyp, rpt 5 yrs [name])   COLONOSCOPY  07/2021   polyps, hemorrhoids, congested scar biopsied, single diverticula ([place]ouraty)   COLONOSCOPY WITH PROPOFOL N/A 02/14/2020   SSP Bonna Gains, Varnita B, MD)   COLONOSCOPY WITH PROPOFOL N/A 06/07/2020   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 (Mansouraty, Telford Nab., MD)   ENDOSCOPIC MUCOSAL RESECTION N/A 06/07/2020   Procedure: ENDOSCOPIC MUCOSAL RESECTION;  Surgeon: Irving Copas., MD;  Location: [facility] END[facility]COPY;  Service: Gastroenterology;  Laterality: N/A;   ESOPHAGOGASTRODUODENOSCOPY  1995   s/p esophageal dilation   FACIAL RECONSTRUCTION SURGERY  1990s   cow headbutted him   FINGER SURGERY Left teenager   reattachment of 2nd,3rd,4th   HEMOSTASIS CLIP PLACEMENT   06/07/2020   Procedure: HEMOSTASIS CLIP PLACEMENT;  Surgeon: Irving Copas., MD;  Location: [place];  Service: Gastroenterology;;   POLYPECTOMY  06/07/2020   Procedure: POLYPECTOMY;  Surgeon: Irving Copas., MD;  Location: [place];  Service: Gastroenterology;;   SUBMUCOSAL LIFTING INJECTION  06/07/2020   Procedure: SUBMUCOSAL LIFTING INJECTION;  Surgeon: Irving Copas., MD;  Location: [place];  Service: Gastroenterology;;   TONSILLECTOMY     TOTAL KNEE ARTHROPLASTY Right 08/09/2019   TOTAL KNEE ARTHROPLASTY Right 08/09/2019   Procedure: RIGHT TOTAL KNEE ARTHROPLASTY-CEMENTED;  Surgeon: Meredith Pel, MD;  Location: [place];  Service: Orthopedics;  Laterality: Right;    Allergies  No Known Allergies  History of Present Illness    Caleb Smith is a 71 y.o. male with a hx of hypothyroidism, hyperlipidemia, GERD, obesity, aortic atherosclerosis, CAD last seen 12/10/21  Prior CT with 3 vessel coronary calcification. Had myoview 2019 which was low risk study. Last seen 01/2019 by Dr. Radford Pax doing well from a cardiovascular perspective.   He saw primary care 12/06/21 for annual visit noted to be bradycardic recommended for cardiology follow up. At clinic visit 12/10/21 heart rate 67 bpm and asymptomatic in regard to previous bradycardia. Sleep study and ZIO ordered. ZIO worn for 1 day (fell off due to sweating at work) average heart rate 72 bpm with no pause and only one run SVT 5 beats. Sleep study revealed OSA.   Presents today for follow up independently. 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 Reports no shortness of breath nor dyspnea on exertion. Reports no chest pain, pressure, or tightness. No edema, orthopnea, PND. Reports no palpitations.    EKGs/Labs/Other Studies Reviewed:   The following studies were reviewed today: ZIO 01-04-2022 Predominant rhythm is normal sinus rhythm with an average heart rate of 72  bpm and ranged from 48 to 119 bpm Rare PAC and 1 run of SVT for 5 beats at 150 bpm Rare PVC     Patch Wear Time:  1 days and 6 hours (2023-06-13T09:[id number] to July 08, 2023T16:04:[id number])   Patient had a min HR of 48 bpm, max HR of 150 bpm, and avg HR of 72 bpm. Predominant underlying rhythm was Sinus Rhythm. 1 run of Supraventricular Tachycardia occurred lasting 5 beats with a max rate of 150 bpm (avg 131 bpm). Isolated SVEs were rare  (<1.0%), and no SVE Couplets or SVE Triplets were present. Isolated VEs were rare (<1.0%), and no VE Couplets or VE Triplets were present.     Myoview 08/2017 Nuclear stress EF: 61%. Blood pressure demonstrated a hypertensive response to exercise. ST segment depression was noted during stress. The study is normal. This is a low risk study. The left ventricular ejection fraction is normal (55-65%).   Low risk stress nuclear study with no ischemia or infarction; note positive ST changes with exercise; EF 61 with normal wall motion.   EKG:  No EKG is ordered today.  The ekg independently reviewed from 12/06/21 demonstrated SB 43 bpm with no acute ST/T wave chnages.   Recent Labs: 11/29/2021: ALT 29; BUN 18; Creatinine, Ser 1.22; Potassium 4.5; Sodium 139; TSH 2.86  Recent Lipid Panel    Component Value Date/Time   CHOL 133 11/29/2021 0724   CHOL 141 02/05/2018 0736   CHOL 204 07/17/2014 0000   TRIG 55.0 11/29/2021 0724   TRIG 58 07/17/2014 0000   HDL 55.00 11/29/2021 0724   HDL 61 02/05/2018 0736   CHOLHDL 2 11/29/2021 0724   VLDL 11.0 11/29/2021 0724   LDLCALC 67 11/29/2021 0724   LDLCALC 67 02/05/2018 0736   LDLCALC 133 07/17/2014 0000   Home Medications   Current Meds  Medication Sig   ASPIRIN 81 PO Take 1 tablet by mouth daily.   atorvastatin (LIPITOR) 80 MG tablet Take 1 tablet (80 mg total) by mouth daily.   Cholecalciferol (VITAMIN D3) 1.25 MG (50000 UT) CAPS Take 1 capsule by mouth daily.   ezetimibe (ZETIA) 10 MG tablet Take 1 tablet  (10 mg total) by mouth daily.   omeprazole (PRILOSEC) 40 MG capsule Take 1 capsule (40 mg total) by mouth daily.   SYNTHROID 100 MCG tablet Take 1 tablet (100 mcg total) by mouth daily before breakfast.   vitamin B-12 (CYANOCOBALAMIN) 1000 MCG tablet Take 1,000 mcg by mouth daily.     Review of Systems      All other systems reviewed and are otherwise negative except as noted above.  Physical Exam    VS:  BP 126/68   Pulse 72   Ht 5' 6.5" (1.689 m)   Wt 203 lb (92.1 kg)   BMI 32.27 kg/m  , BMI Body mass index is 32.27 kg/m.  Wt Readings from Last 3 Encounters:  01/21/22 203 lb (92.1 kg)  12/10/21 211 lb (95.7 kg)  12/06/21 207 lb 8 oz (94.1 kg)     GEN: Well nourished, overweight, well developed, in no acute distress. HEENT: normal. Neck: Supple, no JVD, carotid bruits, or masses.  Cardiac: RRR, no murmurs, rubs, or gallops. No clubbing, cyanosis, edema.  Radials/PT 2+ and equal bilaterally.  Respiratory:  Respirations regular and unlabored, clear to auscultation bilaterally. GI: Soft, nontender, nondistended. MS: No deformity or atrophy. Skin: Warm and dry, no rash. Neuro:  Strength and sensation are intact. Psych: Normal affect.  Assessment & Plan    Bradycardia -11/29/2021 CMP with normal electrolytes. EKG from 12/06/21 with PCP SB 43 bpm with no acute ST/T wave changes. Asymptomatic with no lightheadedness, dizziness, near syncope, syncope. One day ZIO (fell off early due to sweat) with no significant bradycardia. I do suspect his untreated OSA was contributory, management below. As asymptomatic and no repeat bradycardia today in clinic, defer repeat ZIO unless he develops symptoms such as lightheadedness or dizziness. Avoid AV nodal blocking agents.   Snores/Sleep disordered breathing - Stop Bang score 5. Home sleep study positive for OSA. He has upcoming CPAP titration.    Hyperlipidemia -11/29/2021 total cholesterol 133, triglycerides 55, HDL 55, LDL 67, AST 19, ALT 29. LDL  at goal of less than 70, continue Atorvastatin and Zetia.   CAD -  Prior CT with coronary calcification. Stress test 2019 low risk study. Stable with no anginal symptoms. No indication for ischemic evaluation.  GDMT includes Atorvastatin, Zetia, Aspirin. No BB due to bradycardia. Heart healthy diet and regular cardiovascular exercise encouraged.    Hypothyroidism -11/29/2021 TSH 2.86. Levothyroxine managed by primary care.   Obesity - Weight loss via diet and exercise encouraged. Discussed the impact being overweight would have on cardiovascular risk. 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   Disposition: Follow up in 6 months with Fransico Him, MD or APP.  Signed, Loel Dubonnet, NP 01/21/2022, 3:42 PM [place]

## 2022-01-21 NOTE — Patient Instructions (Addendum)
Medication Instructions:  Continue your current medications.   *If you need a refill on your cardiac medications before your next appointment, please call your pharmacy*   Lab Work: None ordered today.   Testing/Procedures: None ordered today.   Your monitor worn for one day showed no significant low heart rates. If you notice symptoms of lightheadedness, dizziness, or passing out please let us know and we will consider another monitor.   Follow-Up: At [facility], you and your health needs are our priority.  As part of our continuing mission to provide you with exceptional heart care, we have created designated Provider Care Teams.  These Care Teams include your primary Cardiologist (physician) and Advanced Practice Providers (APPs -  Physician Assistants and Nurse Practitioners) who all work together to provide you with the care you need, when you need it.  We recommend signing up for the patient portal called "MyChart".  Sign up information is provided on this After Visit Summary.  MyChart is used to connect with patients for Virtual Visits (Telemedicine).  Patients are able to view lab/test results, encounter notes, upcoming appointments, etc.  Non-urgent messages can be sent to your provider as well.   To learn more about what you can do with MyChart, go to [email or web address].    Your next appointment:   6 month(s)  The format for your next appointment:   In Person  Provider:   Fransico Him, MD or Loel Dubonnet, NP     Other Instructions  If you notice your lightheadedness or dizziness, please contact our office.   Heart Healthy Diet Recommendations: A low-salt diet is recommended. Meats should be grilled, baked, or boiled. Avoid fried foods. Focus on lean protein sources like fish or chicken with vegetables and fruits. The American Heart Association is a Microbiologist!  American Heart Association Diet and Lifeystyle Recommendations   Exercise  recommendations: The American Heart Association recommends 150 minutes of moderate intensity exercise weekly. Try 30 minutes of moderate intensity exercise 4-5 times per week. This could include walking, jogging, or swimming.   Important Information About Sugar

## 2022-01-24 ENCOUNTER — Telehealth: Payer: Self-pay | Admitting: *Deleted

## 2022-01-24 NOTE — Chronic Care Management (AMB) (Signed)
  Care Coordination  Note  01/24/2022 Name: Caleb Smith MRN: [id number] DOB: 01-06-1951  Caleb Smith is a 71 y.o. year old male who is a primary care patient of Ria Bush, MD. I reached out to [place] by phone today to offer care coordination services.      Mr. Carrier was given information about Care Coordination services today including:  The Care Coordination services include support from the care team which includes your Nurse Coordinator, Clinical Social Worker, or Pharmacist.  The Care Coordination team is here to help remove barriers to the health concerns and goals most important to you. Care Coordination services are voluntary and the patient may decline or stop services at any time by request to their care team member.   Patient agreed to services and verbal consent obtained.   Follow up plan: Telephone appointment with care coordination team member scheduled for: 01/27/2022  Julian Hy, [place] Direct Dial: [phone]

## 2022-01-27 ENCOUNTER — Telehealth: Payer: Self-pay

## 2022-01-27 NOTE — Patient Outreach (Signed)
  Care Management   Outreach Note  01/27/2022 Name: Caleb Smith MRN: [id number] DOB: [birth year]  Attempted return call to patient.  Unable to reach. HIPAA compliant voice message left with call back phone number.  The patient was referred to the case management team for assistance with care management and care coordination.   Follow Up Plan:  The care management team will reach out to the patient again over the next 10 days.   Quinn Plowman RN,BSN,CCM RN Care Manager Coordinator [place]  [phone]

## 2022-01-27 NOTE — Telephone Encounter (Signed)
This encounter was created in error - please disregard.

## 2022-01-27 NOTE — Patient Outreach (Signed)
  Care Management   Outreach Note  01/27/2022 Name: Caleb Smith MRN: [id number] DOB: 17-Feb-1951  An unsuccessful telephone outreach was attempted today. The patient was referred to the case management team for assistance with care management and care coordination.   Follow Up Plan:  The care management team will reach out to the patient again over the next 10 days.   Quinn Plowman RN,BSN,CCM RN Care Manager Coordinator [place]  [phone]

## 2022-02-02 ENCOUNTER — Ambulatory Visit (HOSPITAL_BASED_OUTPATIENT_CLINIC_OR_DEPARTMENT_OTHER): Payer: BC Managed Care – PPO | Attending: Cardiology | Admitting: Cardiology

## 2022-02-02 DIAGNOSIS — G4733 Obstructive sleep apnea (adult) (pediatric): Secondary | ICD-10-CM | POA: Insufficient documentation

## 2022-02-02 DIAGNOSIS — G4736 Sleep related hypoventilation in conditions classified elsewhere: Secondary | ICD-10-CM | POA: Diagnosis not present

## 2022-02-03 NOTE — Procedures (Signed)
   Patient Name: Caleb Smith, Caleb Smith Date: 02/02/2022 Gender: Male D.O.B: 1950-09-21 Age (years): 65 Referring Provider: Fransico Him MD, ABSM Height (inches): 67 Interpreting Physician: Fransico Him MD, ABSM Weight (lbs): 200 RPSGT: Gwenyth Allegra BMI: 32 MRN: [id number] Neck Size: 17.00  CLINICAL INFORMATION The patient is referred for a CPAP titration to treat sleep apnea.  SLEEP STUDY TECHNIQUE As per the AASM Manual for the Scoring of Sleep and Associated Events v2.3 (April 2016) with a hypopnea requiring 4% desaturations.  The channels recorded and monitored were frontal, central and occipital EEG, electrooculogram (EOG), submentalis EMG (chin), nasal and oral airflow, thoracic and abdominal wall motion, anterior tibialis EMG, snore microphone, electrocardiogram, and pulse oximetry. Bilevel positive airway pressure (BPAP) was initiated at the beginning of the study and titrated to treat sleep-disordered breathing.  MEDICATIONS Medications self-administered by patient taken the night of the study : N/A  RESPIRATORY PARAMETERS Optimal IPAP Pressure (cm): N/A  AHI at Optimal Pressure (/hr) N/A Optimal EPAP Pressure (cm):N/A   Overall Minimal O2 (%):89.0 Minimal O2 at Optimal Pressure (%): N/A SLEEP ARCHITECTURE Start Time:9:55:25 PM  Stop Time:4:31:38 AM  Total Time (min):396.2  Total Sleep Time (min):334.4 Sleep Latency (min):5.3  Sleep Efficiency (%):84.4%  REM Latency (min):113.5  WASO (min):56.5 Stage N1 (%): 17.0%  Stage N2 (%): 52.9%  Stage N3 (%): 0.0%  Stage R (%):30.1 Supine (%):48.70  Arousal Index (/hr):16.1   CARDIAC DATA The 2 lead EKG demonstrated sinus rhythm. The mean heart rate was 55.0 beats per minute. Other EKG findings include: None.  LEG MOVEMENT DATA The total Periodic Limb Movements of Sleep (PLMS) were 0. The PLMS index was 0.0. A PLMS index of <15 is considered normal in adults.  IMPRESSIONS - An optimal PAP pressure could not be  selected for the patient due to ongoing respiratory events.  - Central sleep apnea was not noted during this titration (CAI = 1.6/h). - Mild oxygen desaturations were observed during this titration (min O2 = 89.0%). - The patient snored with moderate snoring volume. - No cardiac abnormalities were observed during this study. - Clinically significant periodic limb movements were not noted during this study. Arousals associated with PLMs were rare.  DIAGNOSIS - Obstructive Sleep Apnea (G47.33)  RECOMMENDATIONS - Repeat in lab study with BiPAP titration. - Avoid alcohol, sedatives and other CNS depressants that may worsen sleep apnea and disrupt normal sleep architecture. - Sleep hygiene should be reviewed to assess factors that may improve sleep quality. - Weight management and regular exercise should be initiated or continued. - Return to Sleep Center for re-evaluation after 4 weeks of therapy  [Electronically signed] 02/03/2022 02:00 PM  Fransico Him MD, ABSM Diplomate, American Board of Sleep Medicine

## 2022-02-18 ENCOUNTER — Telehealth: Payer: Self-pay | Admitting: *Deleted

## 2022-02-18 DIAGNOSIS — I25118 Atherosclerotic heart disease of native coronary artery with other forms of angina pectoris: Secondary | ICD-10-CM

## 2022-02-18 DIAGNOSIS — R0683 Snoring: Secondary | ICD-10-CM

## 2022-02-18 DIAGNOSIS — G4733 Obstructive sleep apnea (adult) (pediatric): Secondary | ICD-10-CM

## 2022-02-18 NOTE — Telephone Encounter (Signed)
Prior Authorization for BIPAP sent to [facility] via web portal. Tracking Number . do not require Pre-Authorization by [employer]

## 2022-02-18 NOTE — Telephone Encounter (Signed)
The patient has been notified of the result and verbalized understanding.  All questions (if any) were answered. Caleb Smith, [name] 8/81/1031 5:94 PM    PRECERT BIPAP TITR

## 2022-02-18 NOTE — Telephone Encounter (Signed)
-----   Message from Lauralee Evener, [place] sent at 02/05/2022  1:23 PM EDT -----  ----- Message ----- From: Sueanne Margarita, MD Sent: 02/03/2022   2:02 PM EDT To: Cv Div Sleep Studies  Unsuccessful CPAP titration due to ongoing events - please set up for in lab BiPAP titration

## 2022-03-09 ENCOUNTER — Ambulatory Visit (HOSPITAL_BASED_OUTPATIENT_CLINIC_OR_DEPARTMENT_OTHER): Payer: BC Managed Care – PPO | Attending: Cardiology | Admitting: Cardiology

## 2022-03-09 VITALS — Ht 66.0 in | Wt 200.0 lb

## 2022-03-09 DIAGNOSIS — I25118 Atherosclerotic heart disease of native coronary artery with other forms of angina pectoris: Secondary | ICD-10-CM | POA: Insufficient documentation

## 2022-03-09 DIAGNOSIS — R0683 Snoring: Secondary | ICD-10-CM | POA: Diagnosis not present

## 2022-03-09 DIAGNOSIS — G4733 Obstructive sleep apnea (adult) (pediatric): Secondary | ICD-10-CM | POA: Insufficient documentation

## 2022-03-10 NOTE — Procedures (Signed)
    Patient Name: Caleb Smith, Caleb Smith Date: 03/09/2022 Gender: Male D.O.B: 1951/05/10 Age (years): 57 Referring Provider: Fransico Him MD, ABSM Height (inches): 67 Interpreting Physician: Fransico Him MD, ABSM Weight (lbs): 200 RPSGT: Gwenyth Allegra BMI: 32 MRN: [id number] Neck Size: 17.00  CLINICAL INFORMATION The patient is referred for a BiPAP titration to treat sleep apnea.  SLEEP STUDY TECHNIQUE As per the AASM Manual for the Scoring of Sleep and Associated Events v2.3 (April 2016) with a hypopnea requiring 4% desaturations.  The channels recorded and monitored were frontal, central and occipital EEG, electrooculogram (EOG), submentalis EMG (chin), nasal and oral airflow, thoracic and abdominal wall motion, anterior tibialis EMG, snore microphone, electrocardiogram, and pulse oximetry. Bilevel positive airway pressure (BPAP) was initiated at the beginning of the study and titrated to treat sleep-disordered breathing.  MEDICATIONS Medications self-administered by patient taken the night of the study : N/A  RESPIRATORY PARAMETERS Optimal IPAP Pressure (cm): 24  AHI at Optimal Pressure (/hr) 0 Optimal EPAP Pressure (cm):20  Overall Minimal O2 (%):91.0  Minimal O2 at Optimal Pressure (%): 95.0  SLEEP ARCHITECTURE Start Time:10:28:29 PM  Stop Time:4:34:52 AM  Total Time (min):366.4  Total Sleep Time (min):307 Sleep Latency (min):11.6  Sleep Efficiency (%):83.8%  REM Latency (min):55.5  WASO (min): 47.7 Stage N1 (%): 19.9%  Stage N2 (%): 62.7%  Stage N3 (%): 0.0%  Stage R (%):17.4 Supine (%):40.23  Arousal Index (/hr):18.2   CARDIAC DATA The 2 lead EKG demonstrated sinus rhythm. The mean heart rate was 46.6 beats per minute. Other EKG findings include: None.  LEG MOVEMENT DATA The total Periodic Limb Movements of Sleep (PLMS) were 0. The PLMS index was 0.0. A PLMS index of <15 is considered normal in adults.  IMPRESSIONS - An optimal PAP pressure was selected  for this patient ( 24 / cm of water) - Central sleep apnea was not noted during this titration (CAI = 0.4/h). - Significant oxygen desaturations were not observed during this titration (min O2 = 91.0%). - The patient snored with moderate snoring volume. - No cardiac abnormalities were observed during this study. - Clinically significant periodic limb movements were not noted during this study. Arousals associated with PLMs were rare.  DIAGNOSIS - Obstructive Sleep Apnea (G47.33)  RECOMMENDATIONS - Trial of ResMed auto BiPAP ST therapy with IPAP max 20cm H2O, EPAP min 5cm H2O and PS 4cm H2O with back up rate at 12/min  with a Small size Fisher[employer]kel Full Face Simplus mask and heated humidification. - Avoid alcohol, sedatives and other CNS depressants that may worsen sleep apnea and disrupt normal sleep architecture. - Sleep hygiene should be reviewed to assess factors that may improve sleep quality. - Weight management and regular exercise should be initiated or continued. - Return to Sleep Center for re-evaluation after 6 weeks of therapy  [Electronically signed] 03/10/2022 02:17 PM  Fransico Him MD, ABSM Diplomate, American Board of Sleep Medicine

## 2022-03-14 ENCOUNTER — Telehealth: Payer: Self-pay | Admitting: *Deleted

## 2022-03-14 DIAGNOSIS — I25118 Atherosclerotic heart disease of native coronary artery with other forms of angina pectoris: Secondary | ICD-10-CM

## 2022-03-14 DIAGNOSIS — G4733 Obstructive sleep apnea (adult) (pediatric): Secondary | ICD-10-CM

## 2022-03-14 NOTE — Telephone Encounter (Signed)
The patient has been notified of the result and verbalized understanding.  All questions (if any) were answered. Caleb Smith, [name] 03/14/2022 2:36 PM    Upon patient request DME selection is Caleb Smith Patient understands he will be contacted by [name] to set up his cpap. Patient understands to call if [name] does not contact him with new setup in a timely manner. Patient understands they will be called once confirmation has been received from [name] that they have received their new machine to schedule 10 week follow up appointment.   [name] notified of new cpap order  Please add to airview Patient was grateful for the call and thanked me.

## 2022-03-14 NOTE — Telephone Encounter (Signed)
-----   Message from Lauralee Evener, [place] sent at 03/11/2022  2:27 PM EDT -----  ----- Message ----- From: Sueanne Margarita, MD Sent: 03/10/2022   2:20 PM EDT To: Cv Div Sleep Studies  Please let patient know that they had a successful PAP titration and let DME know that orders are in EPIC.  Please set up 6 week OV with me. Needs ONO on BiPAP ST

## 2022-04-01 DIAGNOSIS — G4733 Obstructive sleep apnea (adult) (pediatric): Secondary | ICD-10-CM | POA: Diagnosis not present

## 2022-04-01 NOTE — Telephone Encounter (Signed)
Call came from RT at [facility] stating the order for this patient was for: Trial of ResMed auto BiPAP ST therapy with IPAP max 20cm H2O, EPAP min 5cm H2O and PS 4cm H2O with back up rate at 12/min  with a Small size Fisher[employer]Paykel Full Face Simplus mask and heated humidification. RT states there is no place for a back up rate on auto.  RT states Bipap auto ST there is no place for ps of 4  Order sent for auto Bipap 20/5 ps4 Overnight pulse ox on Bipap without the ST

## 2022-05-02 DIAGNOSIS — G4733 Obstructive sleep apnea (adult) (pediatric): Secondary | ICD-10-CM | POA: Diagnosis not present

## 2022-06-01 DIAGNOSIS — G4733 Obstructive sleep apnea (adult) (pediatric): Secondary | ICD-10-CM | POA: Diagnosis not present

## 2022-06-11 ENCOUNTER — Encounter: Payer: Self-pay | Admitting: Cardiology

## 2022-06-11 ENCOUNTER — Ambulatory Visit: Payer: BC Managed Care – PPO | Attending: Cardiology | Admitting: Cardiology

## 2022-06-11 ENCOUNTER — Telehealth: Payer: Self-pay | Admitting: *Deleted

## 2022-06-11 VITALS — BP 140/78 | HR 61 | Ht 66.0 in | Wt 214.4 lb

## 2022-06-11 DIAGNOSIS — E785 Hyperlipidemia, unspecified: Secondary | ICD-10-CM | POA: Diagnosis not present

## 2022-06-11 DIAGNOSIS — I25118 Atherosclerotic heart disease of native coronary artery with other forms of angina pectoris: Secondary | ICD-10-CM

## 2022-06-11 DIAGNOSIS — G4733 Obstructive sleep apnea (adult) (pediatric): Secondary | ICD-10-CM | POA: Diagnosis not present

## 2022-06-11 DIAGNOSIS — I2510 Atherosclerotic heart disease of native coronary artery without angina pectoris: Secondary | ICD-10-CM

## 2022-06-11 NOTE — Telephone Encounter (Signed)
Reminder made for 6 weeks.

## 2022-06-11 NOTE — Telephone Encounter (Signed)
-----   Message from Thompson Grayer, RN sent at 06/11/2022 11:02 AM EST ----- Regarding: download Patient saw Dr Radford Pax today.  She would like you to get a download in 6 weeks. Thanks, Fraser Din

## 2022-06-11 NOTE — Patient Instructions (Signed)
Medication Instructions:  Your physician recommends that you continue on your current medications as directed. Please refer to the Current Medication list given to you today.  *If you need a refill on your cardiac medications before your next appointment, please call your pharmacy*   Lab Work: none If you have labs (blood work) drawn today and your tests are completely normal, you will receive your results only by: [place] (if you have MyChart) OR A paper copy in the mail If you have any lab test that is abnormal or we need to change your treatment, we will call you to review the results.   Testing/Procedures: none   Follow-Up: At [facility], you and your health needs are our priority.  As part of our continuing mission to provide you with exceptional heart care, we have created designated Provider Care Teams.  These Care Teams include your primary Cardiologist (physician) and Advanced Practice Providers (APPs -  Physician Assistants and Nurse Practitioners) who all work together to provide you with the care you need, when you need it.  We recommend signing up for the patient portal called "MyChart".  Sign up information is provided on this After Visit Summary.  MyChart is used to connect with patients for Virtual Visits (Telemedicine).  Patients are able to view lab/test results, encounter notes, upcoming appointments, etc.  Non-urgent messages can be sent to your provider as well.   To learn more about what you can do with MyChart, go to [email or web address].    Your next appointment:   12 month(s)  The format for your next appointment:   In Person  Provider:   Fransico Him, MD     Other Instructions    Important Information About [place]

## 2022-06-11 NOTE — Progress Notes (Signed)
Cardiology Office Note:    Date:  06/11/2022   ID:  Caleb, Smith 05-16-1951, MRN [id number]  PCP:  Ria Bush, MD  Cardiologist:  Fransico Him, MD    Referring MD: Ria Bush, MD   Chief Complaint  Patient presents with   Sleep Apnea   Coronary Artery Disease   Hyperlipidemia    History of Present Illness:    Caleb Smith is a 71 y.o. male with a hx of GERD, HLD and coronary artery calcifications.   He was seen by Laurann Montana in June 2023 due to bradycardia.  PCP had an EKG showing a heart rate of 43 bpm but patient was asymptomatic.  Apparently his wife had done an unofficial sleep study through her dentistry office and showed an AHI of 12 1 night and 24 the next night.  His wife says that he snores and witnesses at the neck episodes.  His STOP-BANG score is 5.    He underwent [facility] sleep study showing moderate obstructive sleep apnea with an AHI of 25/h and centrals apneas of 2/h with O2 saturations as low as 82%.  He did have nocturnal hypoxemia with O2 sats less than 88% for 7.7 minutes.  He presented for CPAP titration but due to ongoing respiratory events he could not be titrated on CPAP.  He underwent BiPAP titration was started on auto BiPAP ST therapy with an IPAP max of 20 cm H2O, EPAP min 5 cm H2O and pressure support 4 cm H2O with a backup rate of 12/min.  He is now here for follow-up.  He is doing well with his PAP device and thinks that he has gotten used to it.  He tolerates the mask and feels the pressure is adequate.  He denies any significant mouth or nasal dryness or nasal congestion.  He does not think that he snores.    He is here today for followup and is doing well.  He denies any chest pain or pressure, SOB, DOE, PND, orthopnea, LE edema, dizziness, palpitations or syncope. He is compliant with his meds and is tolerating meds with no SE.    Past Medical History:  Diagnosis Date   Arthritis    Colon cancer ([place])     Coronary artery calcification seen on CAT scan 08/21/2017   COVID-19 virus infection 07/2020   GERD (gastroesophageal reflux disease) 1995   s/p esoph dilation for stricture   HLD (hyperlipidemia)    Hypothyroidism    Obesity 07/14/2014    Past Surgical History:  Procedure Laterality Date   CARDIOVASCULAR STRESS TEST  07/2017   low risk study ([place])   COLON RESECTION  09/05/2020   Procedure: LAPAROSCOPIC  SEGMENTAL COLECTOMY, TAKE DOWN OF SPLEENIC FLEXURE;  Surgeon: Ileana Roup, MD;  Location: [facility] ORS;  Service: General;;   COLONOSCOPY  03/2003   small sigmoid polyp, rpt 5 yrs [name])   COLONOSCOPY  07/2021   polyps, hemorrhoids, congested scar biopsied, single diverticula ([place])   COLONOSCOPY WITH PROPOFOL N/A 02/14/2020   SSP Bonna Gains, Varnita B, MD)   COLONOSCOPY WITH PROPOFOL N/A 06/07/2020   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 ([place], Telford Nab., MD)   ENDOSCOPIC MUCOSAL RESECTION N/A 06/07/2020   Procedure: ENDOSCOPIC MUCOSAL RESECTION;  Surgeon: Irving Copas., MD;  Location: [facility] ENDOSCOPY;  Service: Gastroenterology;  Laterality: N/A;   ESOPHAGOGASTRODUODENOSCOPY  1995   s/p esophageal dilation   FACIAL RECONSTRUCTION SURGERY  1990s   cow headbutted him  FINGER SURGERY Left teenager   reattachment of 2nd,3rd,4th   HEMOSTASIS CLIP PLACEMENT  06/07/2020   Procedure: HEMOSTASIS CLIP PLACEMENT;  Surgeon: Irving Copas., MD;  Location: [place];  Service: Gastroenterology;;   POLYPECTOMY  06/07/2020   Procedure: POLYPECTOMY;  Surgeon: Irving Copas., MD;  Location: [facility];  Service: Gastroenterology;;   SUBMUCOSAL LIFTING INJECTION  06/07/2020   Procedure: SUBMUCOSAL LIFTING INJECTION;  Surgeon: Irving Copas., MD;  Location: [facility];  Service: Gastroenterology;;   TONSILLECTOMY     TOTAL KNEE ARTHROPLASTY Right 08/09/2019   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   atorvastatin (LIPITOR) 80 MG tablet Take 1 tablet (80 mg total) by mouth daily.   Cholecalciferol (VITAMIN D3) 1.25 MG (50000 UT) CAPS Take 1 capsule by mouth daily.   ezetimibe (ZETIA) 10 MG tablet Take 1 tablet (10 mg total) by mouth daily.   omeprazole (PRILOSEC) 40 MG capsule Take 1 capsule (40 mg total) by mouth daily.   SYNTHROID 100 MCG tablet Take 1 tablet (100 mcg total) by mouth daily before breakfast.   vitamin B-12 (CYANOCOBALAMIN) 1000 MCG tablet Take 1,000 mcg by mouth daily.     Allergies:   Patient has no known allergies.   Social History   Socioeconomic History   Marital status: Married    Spouse name: Not on file   Number of children: Not on file   Years of education: Not on file   Highest education level: Not on file  Occupational History   Not on file  Tobacco Use   Smoking status: Never   Smokeless tobacco: Former    Types: Chew  Vaping Use   Vaping Use: Never used  Substance and Sexual Activity   Alcohol use: Yes    Alcohol/week: 0.0 standard drinks of alcohol    Comment: 2-3 beer daily   Drug use: No   Sexual activity: Not on file  Other Topics Concern   Not on file  Social History Narrative   Lives with wife   Grown children (daughter is Caleb Smith)   Nephew of the [employer]   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[employer]   Edu: HS   Activity: active on farm   Diet: good water, fruits/vegetables daily   Social Determinants of Radio broadcast assistant Strain: Not on file  Food Insecurity: Not on file  Transportation Needs: Not on file  Physical Activity: Not on file  Stress: Not on file  Social Connections: Not on file     Family History: The patient's family history includes CAD  (age of onset: 38) in his maternal uncle; Cancer in his mother; Liver cancer in his mother; Pancreatic cancer in his mother; Suicidality in his father. There is no history of Stroke, Diabetes, Hypertension, Colon cancer, Esophageal cancer, Inflammatory bowel disease, Rectal cancer, or Stomach cancer.  ROS:   Please see the history of present illness.    ROS  All other systems reviewed and negative.   EKGs/Labs/Other Studies Reviewed:    The following studies were reviewed today: none  EKG:  EKG is not ordered today.    Recent Labs: 11/29/2021: ALT 29; BUN 18; Creatinine, Ser 1.22; Potassium 4.5; Sodium 139; TSH 2.86   Recent Lipid Panel  Component Value Date/Time   CHOL 133 11/29/2021 0724   CHOL 141 02/05/2018 0736   CHOL 204 07/17/2014 0000   TRIG 55.0 11/29/2021 0724   TRIG 58 07/17/2014 0000   HDL 55.00 11/29/2021 0724   HDL 61 02/05/2018 0736   CHOLHDL 2 11/29/2021 0724   VLDL 11.0 11/29/2021 0724   LDLCALC 67 11/29/2021 0724   LDLCALC 67 02/05/2018 0736   LDLCALC 133 07/17/2014 0000    Physical Exam:    VS:  BP (!) 140/78   Pulse 61   Ht '5\' 6"'$  (1.676 m)   Wt 214 lb 6.4 oz (97.3 kg)   SpO2 99%   BMI 34.61 kg/m     Wt Readings from Last 3 Encounters:  06/11/22 214 lb 6.4 oz (97.3 kg)  03/09/22 200 lb (90.7 kg)  02/02/22 200 lb (90.7 kg)     GEN: Well nourished, well developed in no acute distress HEENT: Normal NECK: No JVD; No carotid bruits LYMPHATICS: No lymphadenopathy CARDIAC:RRR, no murmurs, rubs, gallops RESPIRATORY:  Clear to auscultation without rales, wheezing or rhonchi  ABDOMEN: Soft, non-tender, non-distended MUSCULOSKELETAL:  No edema; No deformity  SKIN: Warm and dry NEUROLOGIC:  Alert and oriented x 3 PSYCHIATRIC:  Normal affect  ASSESSMENT:    1. Coronary artery disease of native artery of native heart with stable angina pectoris ([place])   2. Hyperlipidemia LDL goal <70   3. OSA (obstructive sleep apnea)    PLAN:    In order of  problems listed above:  1.  Coronary artery calcification -this was noted on prior Chest CT -nuclear stress test showed no ischemia. -He has not had any anginal symptoms  -continue ASA '81mg'$  daily  and statin  2.  Hyperlipidemia -LDL goal I < 70.  -I have personally reviewed and interpreted outside labs performed by patient's PCP which showed LDL 67 and HDL 55 on 11/29/2021 -continue prescription drug management with Atorvastatin '80mg'$  daily and Zetia '10mg'$  daily with PRN refills  3.  OSA - The patient is tolerating PAP therapy well without any problems. The PAP download performed by his DME was personally reviewed and interpreted by me today and showed an AHI of 12/hr on auto BiPAP ST cm H2O with 100% compliance in using more than 4 hours nightly.  The patient has been using and benefiting from PAP use and will continue to benefit from therapy.  -He sleeps on his side and does not think his mask is leaking so not sure why his AHI is still elevated  -he has not changed his cushion out since he got it so I instructed him to change out the cushion every 4 weeks and then I will get a download in 6 weeks  Followup with em in 1 year  Medication Adjustments/Labs and Tests Ordered: Current medicines are reviewed at length with the patient today.  Concerns regarding medicines are outlined above.  No orders of the defined types were placed in this encounter.  No orders of the defined types were placed in this encounter.   Signed, Fransico Him, MD  06/11/2022 10:18 AM    [place]

## 2022-07-17 DIAGNOSIS — G4733 Obstructive sleep apnea (adult) (pediatric): Secondary | ICD-10-CM | POA: Diagnosis not present

## 2022-08-11 ENCOUNTER — Encounter: Payer: Self-pay | Admitting: Family Medicine

## 2022-08-11 ENCOUNTER — Ambulatory Visit (INDEPENDENT_AMBULATORY_CARE_PROVIDER_SITE_OTHER): Payer: PPO | Admitting: Family Medicine

## 2022-08-11 VITALS — BP 132/70 | HR 56 | Temp 97.8°F | Ht 66.0 in | Wt 216.2 lb

## 2022-08-11 DIAGNOSIS — J019 Acute sinusitis, unspecified: Secondary | ICD-10-CM | POA: Diagnosis not present

## 2022-08-11 DIAGNOSIS — G4733 Obstructive sleep apnea (adult) (pediatric): Secondary | ICD-10-CM

## 2022-08-11 HISTORY — DX: Obstructive sleep apnea (adult) (pediatric): G47.33

## 2022-08-11 MED ORDER — AMOXICILLIN-POT CLAVULANATE 875-125 MG PO TABS: 1.0000 | ORAL_TABLET | Freq: Two times a day (BID) | ORAL | 0 refills | Status: AC

## 2022-08-11 NOTE — Progress Notes (Signed)
Patient ID: Caleb Smith, male    DOB: 01-Jun-1951, 72 y.o.   MRN: [id number]  This visit was conducted in person.  BP 132/70   Pulse (!) 56   Temp 97.8 F (36.6 C) (Temporal)   Ht 5' 6"$  (1.676 m)   Wt 216 lb 4 oz (98.1 kg)   SpO2 96%   BMI 34.90 kg/m    CC: cough Subjective:   HPI: Caleb Smith is a 72 y.o. male presenting on 08/11/2022 for Cough (C/o prod cough w/ yellow mucous, nasal drainage/congestion and HA. Sxs started about 2 wks ago. Tried plain Mucinex- helpful. )   3 wk h/o productive cough of yellow mucous associated with nasal congestion, PNDrainage and headache. Had some body aches last week. Head > chest congestion.  Tried OTC mucinex, sinus pills with some improvement.  Last week started feeling worse again.  Wife also sick a few weeks ago.   No fevers/chills, ear or tooth pain, abd pain, nausea, diarrhea.   He has started using BiPAP machine - and noted improvement in his breathing since this.      Relevant past medical, surgical, family and social history reviewed and updated as indicated. Interim medical history since our last visit reviewed. Allergies and medications reviewed and updated. Outpatient Medications Prior to Visit  Medication Sig Dispense Refill   amoxicillin (AMOXIL) 500 MG capsule Take 500 mg by mouth 3 (three) times daily. Take prior to dental procedures.     ASPIRIN 81 PO Take 1 tablet by mouth daily.     atorvastatin (LIPITOR) 80 MG tablet Take 1 tablet (80 mg total) by mouth daily. 90 tablet 3   Cholecalciferol (VITAMIN D3) 1.25 MG (50000 UT) CAPS Take 1 capsule by mouth daily.     ezetimibe (ZETIA) 10 MG tablet Take 1 tablet (10 mg total) by mouth daily. 90 tablet 3   omeprazole (PRILOSEC) 40 MG capsule Take 1 capsule (40 mg total) by mouth daily. 90 capsule 3   SYNTHROID 100 MCG tablet Take 1 tablet (100 mcg total) by mouth daily before breakfast. 90 tablet 3   vitamin B-12 (CYANOCOBALAMIN) 1000 MCG tablet Take 1,000  mcg by mouth daily.     No facility-administered medications prior to visit.     Per HPI unless specifically indicated in ROS section below Review of Systems  Objective:  BP 132/70   Pulse (!) 56   Temp 97.8 F (36.6 C) (Temporal)   Ht 5' 6"$  (1.676 m)   Wt 216 lb 4 oz (98.1 kg)   SpO2 96%   BMI 34.90 kg/m   Wt Readings from Last 3 Encounters:  08/11/22 216 lb 4 oz (98.1 kg)  06/11/22 214 lb 6.4 oz (97.3 kg)  03/09/22 200 lb (90.7 kg)      Physical Exam Vitals and nursing note reviewed.  Constitutional:      Appearance: Normal appearance. He is not ill-appearing.  HENT:     Head: Normocephalic and atraumatic.     Right Ear: Hearing, tympanic membrane, ear canal and external ear normal. There is no impacted cerumen.     Left Ear: Hearing, tympanic membrane, ear canal and external ear normal. There is no impacted cerumen.     Nose: Mucosal edema and congestion present. No rhinorrhea.     Right Turbinates: Not enlarged, swollen or pale.     Left Turbinates: Not enlarged, swollen or pale.     Right Sinus: No maxillary sinus tenderness or frontal sinus  tenderness.     Left Sinus: No maxillary sinus tenderness or frontal sinus tenderness.     Mouth/Throat:     Mouth: Mucous membranes are moist.     Pharynx: Oropharynx is clear. No oropharyngeal exudate or posterior oropharyngeal erythema.  Eyes:     Extraocular Movements: Extraocular movements intact.     Conjunctiva/sclera: Conjunctivae normal.     Pupils: Pupils are equal, round, and reactive to light.  Cardiovascular:     Rate and Rhythm: Normal rate and regular rhythm.     Pulses: Normal pulses.     Heart sounds: Normal heart sounds. No murmur heard. Pulmonary:     Effort: Pulmonary effort is normal. No respiratory distress.     Breath sounds: Normal breath sounds. No wheezing, rhonchi or rales.  Musculoskeletal:     Cervical back: Normal range of motion and neck supple. No rigidity.     Right lower leg: No edema.      Left lower leg: No edema.  Lymphadenopathy:     Cervical: No cervical adenopathy.  Skin:    General: Skin is warm and dry.     Findings: No rash.  Neurological:     Mental Status: He is alert.  Psychiatric:        Mood and Affect: Mood normal.        Behavior: Behavior normal.        Assessment & Plan:   Problem List Items Addressed This Visit     Acute sinusitis - Primary    Anticipate bacterial given duration and progression of symptoms. Rx augmentin course. Supportive measures as per instructions. Update if not improving with treatment.       Relevant Medications   amoxicillin-clavulanate (AUGMENTIN) 875-125 MG tablet   OSA (obstructive sleep apnea)    Notes improvement in breathing since starting BiPAP through cardiology.         Meds ordered this encounter  Medications   amoxicillin-clavulanate (AUGMENTIN) 875-125 MG tablet    Sig: Take 1 tablet by mouth 2 (two) times daily for 10 days.    Dispense:  20 tablet    Refill:  0    No orders of the defined types were placed in this encounter.   Patient Instructions  You likely have a bacterial sinus infection. Take medicine as prescribed: augmentin for 7-10 days  Push fluids and plenty of rest.  Nasal saline irrigation or neti pot to help drain sinuses. May use plain mucinex with plenty of fluid to help mobilize mucous. Please let us know if fever >101.5, trouble opening/closing mouth, difficulty swallowing, or worsening instead of improving as expected.   Follow up plan: Return if symptoms worsen or fail to improve.  Ria Bush, MD

## 2022-08-11 NOTE — Assessment & Plan Note (Signed)
Anticipate bacterial given duration and progression of symptoms. Rx augmentin course. Supportive measures as per instructions. Update if not improving with treatment.

## 2022-08-11 NOTE — Assessment & Plan Note (Addendum)
Notes improvement in breathing since starting BiPAP through cardiology.

## 2022-08-11 NOTE — Patient Instructions (Signed)
You likely have a bacterial sinus infection. Take medicine as prescribed: augmentin for 7-10 days  Push fluids and plenty of rest.  Nasal saline irrigation or neti pot to help drain sinuses. May use plain mucinex with plenty of fluid to help mobilize mucous. Please let us know if fever >101.5, trouble opening/closing mouth, difficulty swallowing, or worsening instead of improving as expected.

## 2022-10-03 DIAGNOSIS — H43813 Vitreous degeneration, bilateral: Secondary | ICD-10-CM | POA: Diagnosis not present

## 2022-10-03 DIAGNOSIS — H2513 Age-related nuclear cataract, bilateral: Secondary | ICD-10-CM | POA: Diagnosis not present

## 2022-10-14 DIAGNOSIS — Z08 Encounter for follow-up examination after completed treatment for malignant neoplasm: Secondary | ICD-10-CM | POA: Diagnosis not present

## 2022-10-14 DIAGNOSIS — Z85038 Personal history of other malignant neoplasm of large intestine: Secondary | ICD-10-CM | POA: Diagnosis not present

## 2022-10-15 ENCOUNTER — Ambulatory Visit (INDEPENDENT_AMBULATORY_CARE_PROVIDER_SITE_OTHER): Payer: PPO | Admitting: Orthopedic Surgery

## 2022-10-15 ENCOUNTER — Other Ambulatory Visit (INDEPENDENT_AMBULATORY_CARE_PROVIDER_SITE_OTHER): Payer: PPO

## 2022-10-15 DIAGNOSIS — M25512 Pain in left shoulder: Secondary | ICD-10-CM

## 2022-10-15 DIAGNOSIS — M75122 Complete rotator cuff tear or rupture of left shoulder, not specified as traumatic: Secondary | ICD-10-CM

## 2022-10-15 DIAGNOSIS — M75121 Complete rotator cuff tear or rupture of right shoulder, not specified as traumatic: Secondary | ICD-10-CM

## 2022-10-15 NOTE — Progress Notes (Signed)
Office Visit Note   Patient: Caleb Smith           Date of Birth: Jul 25, 1950           MRN: [id number] Visit Date: 10/15/2022 Requested by: Eustaquio Boyden, MD [address] [place],  [place] [address] PCP: Eustaquio Boyden, MD  Subjective: Chief Complaint  Patient presents with   Left Shoulder - Pain    HPI: Caleb Smith is a 72 y.o. male who presents to the office reporting .  Left shoulder pain of 3 weeks duration.  States he has some catching in that left shoulder.  Does wake him from sleep.  Reports decreased range of motion.  He still is working.  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  He works for [employer]  Does not really want surgery.  Pain has been getting worse over the last 3 weeks but it has been present for many months.              ROS: All systems reviewed are negative as they relate to the chief complaint within the history of present illness.  Patient denies fevers or chills.  Assessment & Plan: Visit Diagnoses:  1. Left shoulder pain, unspecified chronicity     Plan: Impression is left shoulder pain with some narrowing of the acromiohumeral interval.  No real arthritis in the glenohumeral joint.  Plan at this time is subacromial injection.  Will see how he does with that injection.  Follow-up as needed.  Follow-Up Instructions: No follow-ups on file.   Orders:  Orders Placed This Encounter  Procedures   XR Shoulder Left   No orders of the defined types were placed in this encounter.     Procedures: Large Joint Inj: L subacromial bursa on 10/15/2022 8:02 PM Indications: diagnostic evaluation and pain Details: 18 G 1.5 in needle, posterior approach  Arthrogram: No  Medications: 9 mL bupivacaine 0.5 %; 40 mg methylPREDNISolone acetate 40 MG/ML; 5 mL lidocaine 1 % Outcome: tolerated well, no immediate complications Procedure, treatment alternatives, risks and benefits explained, specific risks  discussed. Consent was given by the patient. Immediately prior to procedure a time out was called to verify the correct patient, procedure, equipment, support staff and site/side marked as required. Patient was prepped and draped in the usual sterile fashion.       Clinical Data: No additional findings.  Objective: Vital Signs: There were no vitals taken for this visit.  Physical Exam:  Constitutional: Patient appears well-developed HEENT:  Head: Normocephalic Eyes:EOM are normal Neck: Normal range of motion Cardiovascular: Normal rate Pulmonary/chest: Effort normal Neurologic: Patient is alert Skin: Skin is warm Psychiatric: Patient has normal mood and affect  Ortho Exam: Ortho exam demonstrates good cervical spine range of motion.  Does have some weakness with external rotation but subscap strength is intact on the left.  Has abduction and forward flexion above 90 but there is weakness present.  Little bit of coarseness with internal extra rotation of that left shoulder.  No AC joint tenderness.  Cervical spine range of motion is full.  Motor or sensory function of the left arm otherwise intact.  Specialty Comments:  No specialty comments available.  Imaging: XR Shoulder Left  Result Date: 10/15/2022 AP axillary outlet radiographs left shoulder reviewed.  No acute fracture.  Shoulder is located.  Slight diminishment of acromiohumeral interval.  No glenohumeral joint arthritis is present.  Mild AC joint degenerative changes.  Visualized lung fields clear    PMFS History: Patient Active Problem List   Diagnosis Date Noted   Acute sinusitis 08/11/2022   OSA (obstructive sleep apnea) 08/11/2022   Sinus bradycardia by electrocardiogram 12/06/2021   S/P laparoscopic-assisted sigmoidectomy 09/05/2020   Cancer of distal transverse colon pT2, pN0 s/p lap colectomy 09/05/2020 06/21/2020   Adenomatous polyp of descending colon 05/14/2020   History of colonic polyps 05/14/2020    Habitual alcohol use 11/25/2019   Arthritis of right knee 08/09/2019   Advanced care planning/counseling discussion 11/13/2017   Coronary artery calcification seen on CAT scan 08/21/2017   Atherosclerosis of aorta 07/03/2017   Lung nodule, solitary 06/28/2017   Pedal edema 11/05/2016   Upper airway cough syndrome 06/26/2016   Health maintenance examination 10/27/2014   Lesion of nose 07/14/2014   Obesity, Class I, BMI 30-34.9 07/14/2014   Hypothyroidism    HLD (hyperlipidemia)    GERD (gastroesophageal reflux disease)    Past Medical History:  Diagnosis Date   Arthritis    Colon cancer (HCC)    Coronary artery calcification seen on CAT scan 08/21/2017   COVID-19 virus infection 07/2020   GERD (gastroesophageal reflux disease) 1995   s/p esoph dilation for stricture   HLD (hyperlipidemia)    Hypothyroidism    Obesity 07/14/2014   OSA on CPAP 08/11/2022    Family History  Problem Relation Age of Onset   Cancer Mother        breast   Pancreatic cancer Mother    Liver cancer Mother    CAD Maternal Uncle 20       massive MI   Suicidality Father        suicide   Stroke Neg Hx    Diabetes Neg Hx    Hypertension Neg Hx    Colon cancer Neg Hx    Esophageal cancer Neg Hx    Inflammatory bowel disease Neg Hx    Rectal cancer Neg Hx    Stomach cancer Neg Hx     Past Surgical History:  Procedure Laterality Date   CARDIOVASCULAR STRESS TEST  07/2017   low risk study ([facility])   COLON RESECTION  09/05/2020   Procedure: LAPAROSCOPIC  SEGMENTAL COLECTOMY, TAKE DOWN OF SPLEENIC FLEXURE;  Surgeon: Andria Meuse, MD;  Location: [facility] ORS;  Service: General;;   COLONOSCOPY  03/2003   small sigmoid polyp, rpt 5 yrs Sherin Quarry)   COLONOSCOPY  07/2021   polyps, hemorrhoids, congested scar biopsied, single diverticula (Mansouraty)   COLONOSCOPY WITH PROPOFOL N/A 02/14/2020   [facility] Maximino Greenland, Varnita B, MD)   COLONOSCOPY WITH PROPOFOL N/A 06/07/2020   foci of invasive adenocarcinoma  arising out of adenomatous polyp (Mansouraty, Netty Starring., MD)   ENDOSCOPIC MUCOSAL RESECTION N/A 06/07/2020   Procedure: ENDOSCOPIC MUCOSAL RESECTION;  Surgeon: Lemar Lofty., MD;  Location: [facility] ENDOSCOPY;  Service: Gastroenterology;  Laterality: N/A;   ESOPHAGOGASTRODUODENOSCOPY  1995   s/p esophageal dilation   FACIAL RECONSTRUCTION SURGERY  1990s   cow headbutted him   FINGER SURGERY Left teenager   reattachment of 2nd,3rd,4th   HEMOSTASIS CLIP PLACEMENT  06/07/2020   Procedure: HEMOSTASIS CLIP PLACEMENT;  Surgeon: Lemar Lofty., MD;  Location: [facility] ENDOSCOPY;  Service: Gastroenterology;;   POLYPECTOMY  06/07/2020   Procedure: POLYPECTOMY;  Surgeon: Lemar Lofty., MD;  Location[facility];  Service: Gastroenterology;;   SUBMUCOSAL LIFTING INJECTION  06/07/2020   Procedure: SUBMUCOSAL LIFTING INJECTION;  Surgeon: Lemar Lofty., MD;  Location: [facility] ENDOSCOPY;  Service:  Gastroenterology;;   TONSILLECTOMY     TOTAL KNEE ARTHROPLASTY Right 08/09/2019   TOTAL KNEE ARTHROPLASTY Right 08/09/2019   Procedure: RIGHT TOTAL KNEE ARTHROPLASTY-CEMENTED;  Surgeon: Cammy Copa, MD;  Location: [facility] OR;  Service: Orthopedics;  Laterality: Right;   Social History   Occupational History   Not on file  Tobacco Use   Smoking status: Never   Smokeless tobacco: Former    Types: Associate Professor Use: Never used  Substance and Sexual Activity   Alcohol use: Yes    Alcohol/week: 0.0 standard drinks of alcohol    Comment: 2-3 beer daily   Drug use: No   Sexual activity: Not on file

## 2022-10-18 ENCOUNTER — Encounter: Payer: Self-pay | Admitting: Orthopedic Surgery

## 2022-10-18 MED ORDER — LIDOCAINE HCL 1 % IJ SOLN
5.0000 mL | INTRAMUSCULAR | Status: AC | PRN
Start: 2022-10-15 — End: 2022-10-15
  Administered 2022-10-15: 5 mL

## 2022-10-18 MED ORDER — BUPIVACAINE HCL 0.5 % IJ SOLN
9.0000 mL | INTRAMUSCULAR | Status: AC | PRN
Start: 2022-10-15 — End: 2022-10-15
  Administered 2022-10-15: 9 mL via INTRA_ARTICULAR

## 2022-10-18 MED ORDER — METHYLPREDNISOLONE ACETATE 40 MG/ML IJ SUSP
40.0000 mg | INTRAMUSCULAR | Status: AC | PRN
Start: 2022-10-15 — End: 2022-10-15
  Administered 2022-10-15: 40 mg via INTRA_ARTICULAR

## 2022-10-27 ENCOUNTER — Other Ambulatory Visit: Payer: Self-pay | Admitting: Family Medicine

## 2022-10-27 DIAGNOSIS — E039 Hypothyroidism, unspecified: Secondary | ICD-10-CM

## 2022-12-01 ENCOUNTER — Other Ambulatory Visit: Payer: Self-pay | Admitting: Family Medicine

## 2022-12-04 ENCOUNTER — Other Ambulatory Visit: Payer: Self-pay | Admitting: Family Medicine

## 2022-12-04 DIAGNOSIS — E039 Hypothyroidism, unspecified: Secondary | ICD-10-CM

## 2022-12-04 DIAGNOSIS — R7303 Prediabetes: Secondary | ICD-10-CM | POA: Insufficient documentation

## 2022-12-04 DIAGNOSIS — Z125 Encounter for screening for malignant neoplasm of prostate: Secondary | ICD-10-CM

## 2022-12-04 DIAGNOSIS — E7800 Pure hypercholesterolemia, unspecified: Secondary | ICD-10-CM

## 2022-12-05 ENCOUNTER — Other Ambulatory Visit (INDEPENDENT_AMBULATORY_CARE_PROVIDER_SITE_OTHER): Payer: PPO

## 2022-12-05 DIAGNOSIS — E039 Hypothyroidism, unspecified: Secondary | ICD-10-CM | POA: Diagnosis not present

## 2022-12-05 DIAGNOSIS — Z125 Encounter for screening for malignant neoplasm of prostate: Secondary | ICD-10-CM

## 2022-12-05 DIAGNOSIS — R7303 Prediabetes: Secondary | ICD-10-CM

## 2022-12-05 DIAGNOSIS — E7800 Pure hypercholesterolemia, unspecified: Secondary | ICD-10-CM

## 2022-12-05 LAB — COMPREHENSIVE METABOLIC PANEL
ALT: 32 U/L (ref 0–53)
AST: 24 U/L (ref 0–37)
Albumin: 4.2 g/dL (ref 3.5–5.2)
Alkaline Phosphatase: 53 U/L (ref 39–117)
BUN: 14 mg/dL (ref 6–23)
CO2: 26 mEq/L (ref 19–32)
Calcium: 9 mg/dL (ref 8.4–10.5)
Chloride: 105 mEq/L (ref 96–112)
Creatinine, Ser: 1.23 mg/dL (ref 0.40–1.50)
GFR: 59.25 mL/min — ABNORMAL LOW (ref >60.00)
Glucose, Bld: 98 mg/dL (ref 70–99)
Potassium: 4.5 mEq/L (ref 3.5–5.1)
Sodium: 139 mEq/L (ref 135–145)
Total Bilirubin: 0.4 mg/dL (ref 0.2–1.2)
Total Protein: 6.6 g/dL (ref 6.0–8.3)

## 2022-12-05 LAB — HEMOGLOBIN A1C: Hgb A1c MFr Bld: 6.1 % (ref 4.6–6.5)

## 2022-12-05 LAB — PSA: PSA: 1.74 ng/mL (ref 0.10–4.00)

## 2022-12-05 LAB — LIPID PANEL
Cholesterol: 124 mg/dL (ref 0–200)
HDL: 53 mg/dL (ref >39.00)
LDL Cholesterol: 60 mg/dL (ref 0–99)
NonHDL: 70.64
Total CHOL/HDL Ratio: 2
Triglycerides: 55 mg/dL (ref 0.0–149.0)
VLDL: 11 mg/dL (ref 0.0–40.0)

## 2022-12-05 LAB — TSH: TSH: 3.68 u[IU]/mL (ref 0.35–5.50)

## 2022-12-12 ENCOUNTER — Ambulatory Visit (INDEPENDENT_AMBULATORY_CARE_PROVIDER_SITE_OTHER): Payer: PPO | Admitting: Family Medicine

## 2022-12-12 ENCOUNTER — Encounter: Payer: Self-pay | Admitting: Family Medicine

## 2022-12-12 VITALS — BP 136/64 | HR 69 | Temp 97.4°F | Ht 66.5 in | Wt 214.1 lb

## 2022-12-12 DIAGNOSIS — G4733 Obstructive sleep apnea (adult) (pediatric): Secondary | ICD-10-CM

## 2022-12-12 DIAGNOSIS — K219 Gastro-esophageal reflux disease without esophagitis: Secondary | ICD-10-CM | POA: Diagnosis not present

## 2022-12-12 DIAGNOSIS — I700 Atherosclerosis of aorta: Secondary | ICD-10-CM

## 2022-12-12 DIAGNOSIS — Z7189 Other specified counseling: Secondary | ICD-10-CM | POA: Diagnosis not present

## 2022-12-12 DIAGNOSIS — E7800 Pure hypercholesterolemia, unspecified: Secondary | ICD-10-CM | POA: Diagnosis not present

## 2022-12-12 DIAGNOSIS — E669 Obesity, unspecified: Secondary | ICD-10-CM

## 2022-12-12 DIAGNOSIS — R7303 Prediabetes: Secondary | ICD-10-CM | POA: Diagnosis not present

## 2022-12-12 DIAGNOSIS — E039 Hypothyroidism, unspecified: Secondary | ICD-10-CM

## 2022-12-12 DIAGNOSIS — F1090 Alcohol use, unspecified, uncomplicated: Secondary | ICD-10-CM

## 2022-12-12 DIAGNOSIS — Z0000 Encounter for general adult medical examination without abnormal findings: Secondary | ICD-10-CM | POA: Insufficient documentation

## 2022-12-12 DIAGNOSIS — C184 Malignant neoplasm of transverse colon: Secondary | ICD-10-CM | POA: Diagnosis not present

## 2022-12-12 MED ORDER — EZETIMIBE 10 MG PO TABS: 10.0000 mg | ORAL_TABLET | Freq: Every day | ORAL | 4 refills | Status: DC

## 2022-12-12 MED ORDER — SYNTHROID 100 MCG PO TABS
100.0000 ug | ORAL_TABLET | Freq: Every day | ORAL | 4 refills | Status: AC
Start: 2022-12-12 — End: ?

## 2022-12-12 MED ORDER — OMEPRAZOLE 40 MG PO CPDR: 40.0000 mg | DELAYED_RELEASE_CAPSULE | Freq: Every day | ORAL | 4 refills | Status: DC

## 2022-12-12 MED ORDER — ATORVASTATIN CALCIUM 80 MG PO TABS: 80.0000 mg | ORAL_TABLET | Freq: Every day | ORAL | 4 refills | Status: DC

## 2022-12-12 NOTE — Assessment & Plan Note (Addendum)
Encouraged limiting alcohol intake, reviewing health risks of ongoing use.

## 2022-12-12 NOTE — Assessment & Plan Note (Signed)
H/o colon adenocarcinoma, regularly sees GI and gen surgery

## 2022-12-12 NOTE — Assessment & Plan Note (Signed)
Chronic, stable on current regimen.  

## 2022-12-12 NOTE — Assessment & Plan Note (Signed)
Appreciate cards care on daily CPAP therapy

## 2022-12-12 NOTE — Assessment & Plan Note (Signed)
Encourage healthy diet and lifestyle choices to affect sustainable weight loss.  

## 2022-12-12 NOTE — Assessment & Plan Note (Signed)
Encouraged limiting added sugars, sweetened beverages, decreasing alcohol intake.

## 2022-12-12 NOTE — Assessment & Plan Note (Signed)
Chronic, good control on statin, zetia - continue. The ASCVD Risk score (Arnett DK, et al., 2019) failed to calculate for the following reasons:   The valid total cholesterol range is 130 to 320 mg/dL

## 2022-12-12 NOTE — Assessment & Plan Note (Addendum)
EKG not performed. Non smoker. I have personally reviewed the Medicare Annual Wellness questionnaire and have noted 1. The patient's medical and social history 2. Their use of alcohol, tobacco or illicit drugs 3. Their current medications and supplements 4. The patient's functional ability including ADL's, fall risks, home safety risks and hearing or visual impairment. Cognitive function has been assessed and addressed as indicated.  5. Diet and physical activity 6. Evidence for depression or mood disorders The patients weight, height, BMI have been recorded in the chart. I have made referrals, counseling and provided education to the patient based on review of the above and I have provided the pt with a written personalized care plan for preventive services. Provider list updated.. See scanned questionairre as needed for further documentation. Reviewed preventative protocols and updated unless pt declined.

## 2022-12-12 NOTE — Assessment & Plan Note (Signed)
Continue aspirin, statin, zetia. 

## 2022-12-12 NOTE — Progress Notes (Addendum)
Ph: [phone] Fax: [phone]   Patient ID: Caleb Smith, male    DOB: 1950-09-17, 72 y.o.   MRN: [id number]  This visit was conducted in person.  BP 136/64   Pulse 69   Temp (!) 97.4 F (36.3 C) (Temporal)   Ht 5' 6.5" (1.689 m)   Wt 214 lb 2 oz (97.1 kg)   SpO2 95%   BMI 34.04 kg/m    CC: Welcome to Medicare  Subjective:   HPI: Mose Mabe is a 71 y.o. male presenting on 12/12/2022 for Medicare Wellness   Got medicare part B 05/2022.  Continues working part time.   Colon cancer 2021 s/p colon cancer resection. Margins uninvolved by cancer, 0/17 LN positive for cancer. Continues seeing GI (Mansouraty) and gen surg (White).   CT from 05/2020 incidentally showed aortic ATH and CAC, as well as prostate enlargement. Stress test 2019 without ischemia. Established with cardiology Mayford Knife) - continues aspirin and statin with LDL goal <70.   OSA - now on CPAP therapy, followed by cardiology.    GERD - continues daily PPI, breakthrough symptoms if dosed less frequently.   Preventative: COLONOSCOPY 06/07/2020 - 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 (Mansouraty, Netty Starring., MD)  COLONOSCOPY 07/2021 - polyps, hemorrhoids, congested scar biopsied, single diverticula, rpt 3 yrs (Mansouraty)  Prostate cancer screening - yearly PSA. No significant BPH symptoms. No fmhx prostate cancer.  Lung cancer screening - not eligible  Flu shot yearly COVID vaccine - J&J 09/2019, Pfizer booster 06/2020  Pneumovax 2013, 10/2019, prevnar-13 06/2017.  Td 2011, Tdap 11/2020 Zostavax 2013  Shingrix - 01/2018, 06/2018 Advanced directive - does not have set up. Would want wife then daughters to be HCPOA. Full code. Does not want prolonged life support if terminal condition. Packet previously provided.  Seat belt use discussed  Sunscreen use discussed. No changing moles on skin  Non smoker  Alcohol - 3-4 beers/day, more on weekends. No liquor.  Dentist -  yearly  Eye exam - yearly  Bowel - no constipation  Bladder - no incontinence   Lives with wife Grown children Occupation: Gen foreman/lineman for [employer]: HS Activity: active on farm, hay  Diet: good water, fruits/vegetables daily      Relevant past medical, surgical, family and social history reviewed and updated as indicated. Interim medical history since our last visit reviewed. Allergies and medications reviewed and updated. Outpatient Medications Prior to Visit  Medication Sig Dispense Refill   amoxicillin (AMOXIL) 500 MG capsule Take 500 mg by mouth 3 (three) times daily. Take prior to dental procedures.     ASPIRIN 81 PO Take 1 tablet by mouth daily.     Cholecalciferol (VITAMIN D3) 1.25 MG (50000 UT) CAPS Take 1 capsule by mouth daily.     vitamin B-12 (CYANOCOBALAMIN) 1000 MCG tablet Take 1,000 mcg by mouth daily.     atorvastatin (LIPITOR) 80 MG tablet TAKE 1 TABLET BY MOUTH EVERY DAY 90 tablet 0   ezetimibe (ZETIA) 10 MG tablet Take 1 tablet (10 mg total) by mouth daily. 90 tablet 3   omeprazole (PRILOSEC) 40 MG capsule Take 1 capsule (40 mg total) by mouth daily. 90 capsule 3   SYNTHROID 100 MCG tablet TAKE 1 TABLET BY MOUTH DAILY BEFORE BREAKFAST. 90 tablet 0   No facility-administered medications prior to visit.     Per HPI unless specifically indicated in ROS section below Review of Systems  Objective:  BP 136/64  Pulse 69   Temp (!) 97.4 F (36.3 C) (Temporal)   Ht 5' 6.5" (1.689 m)   Wt 214 lb 2 oz (97.1 kg)   SpO2 95%   BMI 34.04 kg/m   Wt Readings from Last 3 Encounters:  12/12/22 214 lb 2 oz (97.1 kg)  08/11/22 216 lb 4 oz (98.1 kg)  06/11/22 214 lb 6.4 oz (97.3 kg)      Physical Exam Vitals and nursing note reviewed.  Constitutional:      General: He is not in acute distress.    Appearance: Normal appearance. He is well-developed. He is not ill-appearing.  HENT:     Head: Normocephalic and atraumatic.     Right Ear: Hearing,  tympanic membrane, ear canal and external ear normal.     Left Ear: Hearing, tympanic membrane, ear canal and external ear normal.     Nose: Nose normal.     Mouth/Throat:     Mouth: Mucous membranes are moist.     Pharynx: Oropharynx is clear. No oropharyngeal exudate or posterior oropharyngeal erythema.  Eyes:     General: No scleral icterus.    Extraocular Movements: Extraocular movements intact.     Conjunctiva/sclera: Conjunctivae normal.     Pupils: Pupils are equal, round, and reactive to light.  Neck:     Thyroid: No thyroid mass or thyromegaly.     Vascular: No carotid bruit.  Cardiovascular:     Rate and Rhythm: Normal rate and regular rhythm.     Pulses: Normal pulses.          Radial pulses are 2+ on the right side and 2+ on the left side.     Heart sounds: Normal heart sounds. No murmur heard. Pulmonary:     Effort: Pulmonary effort is normal. No respiratory distress.     Breath sounds: Normal breath sounds. No wheezing, rhonchi or rales.  Abdominal:     General: Bowel sounds are normal. There is no distension.     Palpations: Abdomen is soft. There is no mass.     Tenderness: There is no abdominal tenderness. There is no guarding or rebound.     Hernia: No hernia is present.  Musculoskeletal:        General: Normal range of motion.     Cervical back: Normal range of motion and neck supple.     Right lower leg: No edema.     Left lower leg: No edema.  Lymphadenopathy:     Cervical: No cervical adenopathy.  Skin:    General: Skin is warm and dry.     Findings: No rash.  Neurological:     General: No focal deficit present.     Mental Status: He is alert and oriented to person, place, and time.     Comments:  Recall 3/3 Calculation 3/5 serial 3s  Psychiatric:        Mood and Affect: Mood normal.        Behavior: Behavior normal.        Thought Content: Thought content normal.        Judgment: Judgment normal.       Results for orders placed or performed in  visit on 12/05/22  Hemoglobin A1c  Result Value Ref Range   Hgb A1c MFr Bld 6.1 4.6 - 6.5 %  PSA  Result Value Ref Range   PSA 1.74 0.10 - 4.00 ng/mL  TSH  Result Value Ref Range   TSH 3.68 0.35 - 5.50  uIU/mL  Comprehensive metabolic panel  Result Value Ref Range   Sodium 139 135 - 145 mEq/L   Potassium 4.5 3.5 - 5.1 mEq/L   Chloride 105 96 - 112 mEq/L   CO2 26 19 - 32 mEq/L   Glucose, Bld 98 70 - 99 mg/dL   BUN 14 6 - 23 mg/dL   Creatinine, Ser 1.61 0.40 - 1.50 mg/dL   Total Bilirubin 0.4 0.2 - 1.2 mg/dL   Alkaline Phosphatase 53 39 - 117 U/L   AST 24 0 - 37 U/L   ALT 32 0 - 53 U/L   Total Protein 6.6 6.0 - 8.3 g/dL   Albumin 4.2 3.5 - 5.2 g/dL   GFR 09.60 (L) >45.40 mL/min   Calcium 9.0 8.4 - 10.5 mg/dL  Lipid panel  Result Value Ref Range   Cholesterol 124 0 - 200 mg/dL   Triglycerides 98.1 0.0 - 149.0 mg/dL   HDL 19.14 >78.29 mg/dL   VLDL 56.2 0.0 - 13.0 mg/dL   LDL Cholesterol 60 0 - 99 mg/dL   Total CHOL/HDL Ratio 2    NonHDL 70.64     Assessment & Plan:   Problem List Items Addressed This Visit     Advanced care planning/counseling discussion (Chronic)    Advanced directive - does not have set up. Would want wife then daughters to be HCPOA. Full code. Does not want prolonged life support if terminal condition. Packet previously provided.       Welcome to Medicare preventive visit - Primary (Chronic)    EKG not performed. Non smoker. I have personally reviewed the Medicare Annual Wellness questionnaire and have noted 1. The patient's medical and social history 2. Their use of alcohol, tobacco or illicit drugs 3. Their current medications and supplements 4. The patient's functional ability including ADL's, fall risks, home safety risks and hearing or visual impairment. Cognitive function has been assessed and addressed as indicated.  5. Diet and physical activity 6. Evidence for depression or mood disorders The patients weight, height, BMI have been  recorded in the chart. I have made referrals, counseling and provided education to the patient based on review of the above and I have provided the pt with a written personalized care plan for preventive services. Provider list updated.. See scanned questionairre as needed for further documentation. Reviewed preventative protocols and updated unless pt declined.       Hypothyroidism    Chronic, stable on current regimen.       Relevant Medications   SYNTHROID 100 MCG tablet   HLD (hyperlipidemia)    Chronic, good control on statin, zetia - continue. The ASCVD Risk score (Arnett DK, et al., 2019) failed to calculate for the following reasons:   The valid total cholesterol range is 130 to 320 mg/dL       Relevant Medications   ezetimibe (ZETIA) 10 MG tablet   atorvastatin (LIPITOR) 80 MG tablet   GERD (gastroesophageal reflux disease)    Chronic, stable on daily PPI - continue this.      Relevant Medications   omeprazole (PRILOSEC) 40 MG capsule   Obesity, Class I, BMI 30-34.9    Encourage healthy diet and lifestyle choices to affect sustainable weight loss.       Atherosclerosis of aorta (HCC)    Continue aspirin, statin, zetia      Relevant Medications   ezetimibe (ZETIA) 10 MG tablet   atorvastatin (LIPITOR) 80 MG tablet   Habitual alcohol use    Encouraged  limiting alcohol intake, reviewing health risks of ongoing use.       Cancer of distal transverse colon pT2, pN0 s/p lap colectomy 09/05/2020    H/o colon adenocarcinoma, regularly sees GI and gen surgery      OSA (obstructive sleep apnea)    Appreciate cards care on daily CPAP therapy      Prediabetes    Encouraged limiting added sugars, sweetened beverages, decreasing alcohol intake.         Meds ordered this encounter  Medications   omeprazole (PRILOSEC) 40 MG capsule    Sig: Take 1 capsule (40 mg total) by mouth daily.    Dispense:  90 capsule    Refill:  4   ezetimibe (ZETIA) 10 MG tablet    Sig:  Take 1 tablet (10 mg total) by mouth daily.    Dispense:  90 tablet    Refill:  4   SYNTHROID 100 MCG tablet    Sig: Take 1 tablet (100 mcg total) by mouth daily before breakfast.    Dispense:  90 tablet    Refill:  4   atorvastatin (LIPITOR) 80 MG tablet    Sig: Take 1 tablet (80 mg total) by mouth daily.    Dispense:  90 tablet    Refill:  4    No orders of the defined types were placed in this encounter.   Patient Instructions  Consider RSV shot through pharmacy.  Continue working on advanced directive.  Good to see you today.  Return as needed or in 1 year for next physical /wellness visit.   Follow up plan: Return in about 1 year (around 12/12/2023), or if symptoms worsen or fail to improve, for medicare wellness visit.  Eustaquio Boyden, MD

## 2022-12-12 NOTE — Assessment & Plan Note (Signed)
Advanced directive - does not have set up. Would want wife then daughters to be HCPOA. Full code. Does not want prolonged life support if terminal condition. Packet previously provided.

## 2022-12-12 NOTE — Assessment & Plan Note (Signed)
Chronic, stable on daily PPI - continue this.

## 2022-12-12 NOTE — Patient Instructions (Addendum)
Consider RSV shot through pharmacy.  Continue working on advanced directive.  Good to see you today.  Return as needed or in 1 year for next physical /wellness visit.

## 2023-01-27 DIAGNOSIS — G4733 Obstructive sleep apnea (adult) (pediatric): Secondary | ICD-10-CM | POA: Diagnosis not present

## 2023-05-12 DIAGNOSIS — G4733 Obstructive sleep apnea (adult) (pediatric): Secondary | ICD-10-CM | POA: Diagnosis not present

## 2023-06-02 DIAGNOSIS — G4733 Obstructive sleep apnea (adult) (pediatric): Secondary | ICD-10-CM | POA: Diagnosis not present

## 2023-06-07 NOTE — Progress Notes (Unsigned)
Cardiology Office Note:  .   Date:  06/08/2023  ID:  Caleb Smith, DOB 1950-12-11, MRN [id number] PCP: Eustaquio Boyden, MD  [facility] Providers Cardiologist:  Armanda Magic, MD {  History of Present Illness: Caleb Smith   Caleb Smith is a 72 y.o. male with a past medical history of GERD, HLD and coronary artery calcifications who was seen a year ago by Dr. Mayford Knife.  He presents for follow-up appointment.  PCP had an EKG showing a heart rate of 43 bpm but patient was asymptomatic.  Apparently his wife had done an unofficial sleep study through her dentistry office showed an AHI of 12 1 night and 20 for the next night.  His wife stated that he snored and apneic episode.  STOP-BANG is 5.  Patient underwent in for sleep study showed moderate obstructive sleep apnea.  He did have some nocturnal hypoxemia with O2 sats less than 88% for 7.7 minutes.  Presented for CPAP titration but due to ongoing respiratory events cannot be titrated on CPAP.  Underwent BiPAP titration and was started on auto BiPAP ST therapy.  Follow-up was centered on his PAP device.  He was tolerating the mask and pressure felt adequate.  He did not think that he snored.  No chest pain or pressure, SOB, DOE, PND, orthopnea, lower extremity edema, dizziness, palpitations, or syncope.  Compliant with all medications with no side effects.  Today, he presents with a history of sleep apnea, reports consistent use of his Pap device every night without any issues. He reports good pressure and adequate sleep, with an average of six and a half to eight and a half hours of sleep per night.  In the past, the patient had a lower heart rate, which led to a referral to a cardiologist. He underwent a monitor and stress test a few years ago, but the results of these tests are not discussed in the current encounter.  The patient is on Lipitor and Zetia for cholesterol management. His most recent lipid panel showed an LDL of 60, HDL  of 53, and triglycerides of 55. He has an annual check-up with his primary care physician, and his most recent visit was in June.  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 During his most recent DOT physical, his blood pressure was slightly elevated. He does not have a blood pressure cuff at home but has been advised to get one and monitor his blood pressure.  Reports no shortness of breath nor dyspnea on exertion. Reports no chest pain, pressure, or tightness. No edema, orthopnea, PND. Reports no palpitations.   Discussed the use of AI scribe software for clinical note transcription with the patient, who gave verbal consent to proceed.  ROS: Pertinent ROS in HPI  Studies Reviewed: .       Cardiac monitor 12/10/2021 Predominant rhythm is normal sinus rhythm with an average heart rate of 72 bpm and ranged from 48 to 119 bpm Rare PAC and 1 run of SVT for 5 beats at 150 bpm Rare PVC     Patch Wear Time:  1 days and 6 hours (2023-06-13T09:09:06-398 to 2023-06-14T16:04:46-0400)   Patient had a min HR of 48 bpm, max HR of 150 bpm, and avg HR of 72 bpm. Predominant underlying rhythm was Sinus Rhythm. 1 run of Supraventricular Tachycardia occurred lasting 5 beats with a max rate of 150 bpm (avg 131 bpm). Isolated SVEs were  rare  (<1.0%), and no SVE Couplets or SVE Triplets were present. Isolated VEs were rare (<1.0%), and no VE Couplets or VE Triplets were present.    Physical Exam:   VS:  BP (!) 144/72   Pulse 65   Ht 5' 6.5" (1.689 m)   Wt 234 lb (106.1 kg)   SpO2 94%   BMI 37.20 kg/m    Wt Readings from Last 3 Encounters:  06/08/23 234 lb (106.1 kg)  12/12/22 214 lb 2 oz (97.1 kg)  08/11/22 216 lb 4 oz (98.1 kg)    GEN: Well nourished, well developed in no acute distress NECK: No JVD; No carotid bruits CARDIAC: RRR, no murmurs, rubs, gallops RESPIRATORY:  Clear to auscultation without rales, wheezing or rhonchi  ABDOMEN:  Soft, non-tender, non-distended EXTREMITIES:  No edema; No deformity   ASSESSMENT AND PLAN: .   Coronary artery calcifications -Currently not having any chest pain or shortness of breath -Last stress test was back in March 2019 -Continue current medication regimen including Lipitor 80 mg daily, Zetia 10 mg daily -Continue aspirin 81 mg daily  Sleep Apnea Consistent use of CPAP device with good pressure and no reported issues. Sleep duration varies but generally adequate. -Continue current CPAP use.  Past Bradycardia Previous episode of low heart rate, but current heart rate and rhythm are normal. No current cardiac symptoms reported. -Last cardiac monitor reviewed -Continue current management.  Hyperlipidemia Recent lipid panel shows good control with LDL at 60, HDL at 53, and triglycerides at 55. -Continue current lipid-lowering medications (Lipitor, Zetia). -Annual lipid panel monitoring.  Hypertension Blood pressure slightly elevated at 144/72 during today's visit, but previous readings have been within normal range. -Purchase home blood pressure cuff (Omron recommended) and monitor blood pressure a few times per week. -If readings consistently above 140 systolic, contact office for potential medication adjustment.      Dispo: You can follow-up in 1 year with Dr. Mayford Knife  Signed, Sharlene Dory, PA-C

## 2023-06-08 ENCOUNTER — Ambulatory Visit: Payer: PPO | Attending: Physician Assistant | Admitting: Physician Assistant

## 2023-06-08 ENCOUNTER — Encounter: Payer: Self-pay | Admitting: Physician Assistant

## 2023-06-08 VITALS — BP 144/72 | HR 65 | Ht 66.5 in | Wt 234.0 lb

## 2023-06-08 DIAGNOSIS — R0683 Snoring: Secondary | ICD-10-CM | POA: Diagnosis not present

## 2023-06-08 DIAGNOSIS — E782 Mixed hyperlipidemia: Secondary | ICD-10-CM | POA: Diagnosis not present

## 2023-06-08 DIAGNOSIS — R001 Bradycardia, unspecified: Secondary | ICD-10-CM | POA: Diagnosis not present

## 2023-06-08 DIAGNOSIS — G4733 Obstructive sleep apnea (adult) (pediatric): Secondary | ICD-10-CM | POA: Diagnosis not present

## 2023-06-08 MED ORDER — ATORVASTATIN CALCIUM 80 MG PO TABS: 80.0000 mg | ORAL_TABLET | Freq: Every day | ORAL | 3 refills | Status: AC

## 2023-06-08 MED ORDER — EZETIMIBE 10 MG PO TABS: 10.0000 mg | ORAL_TABLET | Freq: Every day | ORAL | 3 refills | Status: DC

## 2023-06-08 NOTE — Patient Instructions (Signed)
Medication Instructions:  Your physician recommends that you continue on your current medications as directed. Please refer to the Current Medication list given to you today.  *If you need a refill on your cardiac medications before your next appointment, please call your pharmacy*   Lab Work: None ordered  If you have labs (blood work) drawn today and your tests are completely normal, you will receive your results only by: MyChart Message (if you have MyChart) OR A paper copy in the mail If you have any lab test that is abnormal or we need to change your treatment, we will call you to review the results.   Testing/Procedures: None ordered   Follow-Up: At [facility] : [facility], you and your health needs are our priority.  As part of our continuing mission to provide you with exceptional heart care, we have created designated Provider Care Teams.  These Care Teams include your primary Cardiologist (physician) and Advanced Practice Providers (APPs -  Physician Assistants and Nurse Practitioners) who all work together to provide you with the care you need, when you need it.  We recommend signing up for the patient portal called "MyChart".  Sign up information is provided on this After Visit Summary.  MyChart is used to connect with patients for Virtual Visits (Telemedicine).  Patients are able to view lab/test results, encounter notes, upcoming appointments, etc.  Non-urgent messages can be sent to your provider as well.   To learn more about what you can do with MyChart, go to [email or web address].    Your next appointment:   1 year(s)  Provider:   Armanda Magic, MD     Other Instructions I do recommend a Amron blood pressure cuff.

## 2023-07-03 DIAGNOSIS — G4733 Obstructive sleep apnea (adult) (pediatric): Secondary | ICD-10-CM | POA: Diagnosis not present

## 2023-07-05 ENCOUNTER — Encounter (HOSPITAL_BASED_OUTPATIENT_CLINIC_OR_DEPARTMENT_OTHER): Payer: Self-pay | Admitting: Cardiology

## 2023-08-03 DIAGNOSIS — G4733 Obstructive sleep apnea (adult) (pediatric): Secondary | ICD-10-CM | POA: Diagnosis not present

## 2023-08-06 ENCOUNTER — Other Ambulatory Visit: Payer: Self-pay | Admitting: Physician Assistant

## 2023-08-06 ENCOUNTER — Ambulatory Visit: Payer: Self-pay | Admitting: Family Medicine

## 2023-08-06 MED ORDER — OSELTAMIVIR PHOSPHATE 75 MG PO CAPS: 75.0000 mg | ORAL_CAPSULE | Freq: Two times a day (BID) | ORAL | 0 refills | Status: AC

## 2023-08-06 NOTE — Telephone Encounter (Signed)
  Chief Complaint: cough Symptoms: cough, runny nose, tickle in throat Frequency: yesterday Pertinent Negatives: Patient denies blood tinged sputum, flu/covid vax, fever presently,    Disposition: [] ED /[] Urgent Care (no appt availability in office) / [] Appointment(In office/virtual)/ []  [facility]/ [x] Home Care/ [] Refused Recommended Disposition /[] [facility]/ []  Follow-up with PCP  Additional Notes: Pt states that he started yesterday with a cough, runny nose, throat tickle, and low grade fever. Pt states that he is afebrile at this time. Denies SOB. Pt states that he is taking OTC medications and they seem to be helping. Pt calling as he would like to get a cough medicine from his doctor. Pt prefers not to be seen. Pt advised that there are appts tomorrow avail, pt declines and states that he hopes he feels better tomorrow. RN advised of care instructions per Epic, pt understood.   Copied from CRM [id number]. Topic: Clinical - Pink Word Triage >> Aug 06, 2023 12:21 PM Robinson H wrote: Reason for Triage: Patients wife calling she's at work patient is at home achy, coughing, fever, runny nose.  Deniel [phone] Reason for Disposition  Cough  Answer Assessment - Initial Assessment Questions 1. ONSET: When did the cough begin?      yesterday 2. SEVERITY: How bad is the cough today?      Not to bad today, then this afternoon it got worse 3. SPUTUM: Describe the color of your sputum (none, dry cough; clear, white, yellow, green)     clear 4. HEMOPTYSIS: Are you coughing up any blood? If so ask: How much? (flecks, streaks, tablespoons, etc.)     denies 5. DIFFICULTY BREATHING: Are you having difficulty breathing? If Yes, ask: How bad is it? (e.g., mild, moderate, severe)    - MILD: No SOB at rest, mild SOB with walking, speaks normally in sentences, can lie down, no retractions, pulse < 100.    - MODERATE: SOB at rest, SOB with minimal exertion and  prefers to sit, cannot lie down flat, speaks in phrases, mild retractions, audible wheezing, pulse 100-120.    - SEVERE: Very SOB at rest, speaks in single words, struggling to breathe, sitting hunched forward, retractions, pulse > 120      denies 6. FEVER: Do you have a fever? If Yes, ask: What is your temperature, how was it measured, and when did it start?     Denies, yesterday was 101 but nothing since 7. CARDIAC HISTORY: Do you have any history of heart disease? (e.g., heart attack, congestive heart failure)      denies 8. LUNG HISTORY: Do you have any history of lung disease?  (e.g., pulmonary embolus, asthma, emphysema)     denies 9. PE RISK FACTORS: Do you have a history of blood clots? (or: recent major surgery, recent prolonged travel, bedridden)     Denies 10. OTHER SYMPTOMS: Do you have any other symptoms? (e.g., runny nose, wheezing, chest pain)       Runny nose, sore throat,  12. TRAVEL: Have you traveled out of the country in the last month? (e.g., travel history, exposures)       denies  Protocols used: Cough - Acute Productive-A-AH

## 2023-08-07 MED ORDER — BENZONATATE 100 MG PO CAPS: 100.0000 mg | ORAL_CAPSULE | Freq: Two times a day (BID) | ORAL | 0 refills | Status: DC | PRN

## 2023-08-07 NOTE — Telephone Encounter (Signed)
Spoke with pt relaying Dr. G's message.  Pt verbalizes understanding and expresses his thanks.  

## 2023-08-07 NOTE — Addendum Note (Signed)
 Addended by: Claire Crick on: 08/07/2023 07:31 AM   Modules accepted: Orders

## 2023-08-07 NOTE — Telephone Encounter (Signed)
 I was out of office yesterday. I have sent in some tessalon  perls for patient. If fever, worsening productive cough or feeling ill recommend he come in for office visit today.

## 2023-08-12 DIAGNOSIS — G4733 Obstructive sleep apnea (adult) (pediatric): Secondary | ICD-10-CM | POA: Diagnosis not present

## 2023-08-31 DIAGNOSIS — G4733 Obstructive sleep apnea (adult) (pediatric): Secondary | ICD-10-CM | POA: Diagnosis not present

## 2023-10-01 DIAGNOSIS — G4733 Obstructive sleep apnea (adult) (pediatric): Secondary | ICD-10-CM | POA: Diagnosis not present

## 2023-10-09 DIAGNOSIS — H43813 Vitreous degeneration, bilateral: Secondary | ICD-10-CM | POA: Diagnosis not present

## 2023-10-09 DIAGNOSIS — H25041 Posterior subcapsular polar age-related cataract, right eye: Secondary | ICD-10-CM | POA: Diagnosis not present

## 2023-10-09 DIAGNOSIS — H2513 Age-related nuclear cataract, bilateral: Secondary | ICD-10-CM | POA: Diagnosis not present

## 2023-10-09 DIAGNOSIS — H2511 Age-related nuclear cataract, right eye: Secondary | ICD-10-CM | POA: Diagnosis not present

## 2023-10-20 DIAGNOSIS — H2512 Age-related nuclear cataract, left eye: Secondary | ICD-10-CM | POA: Diagnosis not present

## 2023-10-20 DIAGNOSIS — H2511 Age-related nuclear cataract, right eye: Secondary | ICD-10-CM | POA: Diagnosis not present

## 2023-10-21 ENCOUNTER — Encounter: Payer: Self-pay | Admitting: Ophthalmology

## 2023-10-21 NOTE — Anesthesia Preprocedure Evaluation (Addendum)
 Anesthesia Evaluation  Patient identified by MRN, date of birth, ID band Patient awake    Reviewed: Allergy  & Precautions, H&P , NPO status , Patient's Chart, lab work & pertinent test results  Airway Mallampati: III  TM Distance: >3 FB Neck ROM: Full    Dental no notable dental hx. (+) Caps   Pulmonary neg pulmonary ROS, sleep apnea    Pulmonary exam normal breath sounds clear to auscultation       Cardiovascular + CAD  negative cardio ROS Normal cardiovascular exam Rhythm:Regular Rate:Normal     Neuro/Psych negative neurological ROS  negative psych ROS   GI/Hepatic negative GI ROS, Neg liver ROS,GERD  ,,  Endo/Other  negative endocrine ROSHypothyroidism    Renal/GU negative Renal ROS  negative genitourinary   Musculoskeletal negative musculoskeletal ROS (+) Arthritis ,    Abdominal   Peds negative pediatric ROS (+)  Hematology negative hematology ROS (+)   Anesthesia Other Findings Hypothyroidism  HLD (hyperlipidemia) GERD (gastroesophageal reflux disease) Obesity Coronary artery calcification seen on CAT scan  Arthritis Colon cancer  OSA on CPAP     Reproductive/Obstetrics negative OB ROS                             Anesthesia Physical Anesthesia Plan  ASA: 3  Anesthesia Plan: MAC   Post-op Pain Management:    Induction: Intravenous  PONV Risk Score and Plan:   Airway Management Planned: Natural Airway and Nasal Cannula  Additional Equipment:   Intra-op Plan:   Post-operative Plan:   Informed Consent: I have reviewed the patients History and Physical, chart, labs and discussed the procedure including the risks, benefits and alternatives for the proposed anesthesia with the patient or authorized representative who has indicated his/her understanding and acceptance.     Dental Advisory Given  Plan Discussed with: Anesthesiologist, CRNA and  Surgeon  Anesthesia Plan Comments: (Patient consented for risks of anesthesia including but not limited to:  - adverse reactions to medications - damage to eyes, teeth, lips or other oral mucosa - nerve damage due to positioning  - sore throat or hoarseness - Damage to heart, brain, nerves, lungs, other parts of body or loss of life  Patient voiced understanding and assent.)       Anesthesia Quick Evaluation

## 2023-10-21 NOTE — Discharge Instructions (Signed)

## 2023-10-27 ENCOUNTER — Ambulatory Visit
Admission: RE | Admit: 2023-10-27 | Discharge: 2023-10-27 | Disposition: A | Discharge status: Home / Self Care | Attending: Ophthalmology | Admitting: Ophthalmology

## 2023-10-27 ENCOUNTER — Ambulatory Visit: Admitting: Anesthesiology

## 2023-10-27 ENCOUNTER — Encounter
Admission: RE | Disposition: A | Discharge status: Home / Self Care | Payer: Self-pay | Source: Home / Self Care | Attending: Ophthalmology

## 2023-10-27 ENCOUNTER — Other Ambulatory Visit: Payer: Self-pay

## 2023-10-27 ENCOUNTER — Encounter: Payer: Self-pay | Admitting: Ophthalmology

## 2023-10-27 DIAGNOSIS — K219 Gastro-esophageal reflux disease without esophagitis: Secondary | ICD-10-CM | POA: Insufficient documentation

## 2023-10-27 DIAGNOSIS — I2510 Atherosclerotic heart disease of native coronary artery without angina pectoris: Secondary | ICD-10-CM | POA: Diagnosis not present

## 2023-10-27 DIAGNOSIS — E039 Hypothyroidism, unspecified: Secondary | ICD-10-CM | POA: Insufficient documentation

## 2023-10-27 DIAGNOSIS — Z79890 Hormone replacement therapy (postmenopausal): Secondary | ICD-10-CM | POA: Diagnosis not present

## 2023-10-27 DIAGNOSIS — H2511 Age-related nuclear cataract, right eye: Secondary | ICD-10-CM | POA: Diagnosis not present

## 2023-10-27 DIAGNOSIS — Z87891 Personal history of nicotine dependence: Secondary | ICD-10-CM | POA: Diagnosis not present

## 2023-10-27 DIAGNOSIS — G4733 Obstructive sleep apnea (adult) (pediatric): Secondary | ICD-10-CM | POA: Diagnosis not present

## 2023-10-27 HISTORY — PX: CATARACT EXTRACTION W/PHACO: SHX586

## 2023-10-27 SURGERY — PHACOEMULSIFICATION, CATARACT, WITH IOL INSERTION
Anesthesia: Monitor Anesthesia Care | Site: Eye | Laterality: Right

## 2023-10-27 MED ORDER — MIDAZOLAM HCL 2 MG/2ML IJ SOLN
INTRAMUSCULAR | Status: DC | PRN
  Administered 2023-10-27: 2 mg via INTRAVENOUS

## 2023-10-27 MED ORDER — GLYCOPYRROLATE PF 0.2 MG/ML IJ SOSY
PREFILLED_SYRINGE | INTRAMUSCULAR | Status: DC | PRN
  Administered 2023-10-27 (×2): .2 mg via INTRAVENOUS

## 2023-10-27 MED ORDER — SIGHTPATH DOSE#1 NA CHONDROIT SULF-NA HYALURON 40-17 MG/ML IO SOLN
INTRAOCULAR | Status: DC | PRN
  Administered 2023-10-27: 1 mL via INTRAOCULAR

## 2023-10-27 MED ORDER — ARMC OPHTHALMIC DILATING DROPS
OPHTHALMIC | Status: AC
  Filled 2023-10-27: qty 0.5

## 2023-10-27 MED ORDER — BRIMONIDINE TARTRATE-TIMOLOL 0.2-0.5 % OP SOLN
OPHTHALMIC | Status: DC | PRN
  Administered 2023-10-27: 1 [drp] via OPHTHALMIC

## 2023-10-27 MED ORDER — ARMC OPHTHALMIC DILATING DROPS
1.0000 | OPHTHALMIC | Status: DC | PRN
  Administered 2023-10-27 (×3): 1 via OPHTHALMIC

## 2023-10-27 MED ORDER — MIDAZOLAM HCL 2 MG/2ML IJ SOLN
INTRAMUSCULAR | Status: AC
Start: 2023-10-27 — End: ?
  Filled 2023-10-27: qty 2

## 2023-10-27 MED ORDER — TETRACAINE HCL 0.5 % OP SOLN
OPHTHALMIC | Status: AC
  Filled 2023-10-27: qty 4

## 2023-10-27 MED ORDER — SODIUM CHLORIDE 0.9% FLUSH
INTRAVENOUS | Status: DC | PRN
  Administered 2023-10-27 (×3): 10 mL via INTRAVENOUS

## 2023-10-27 MED ORDER — SIGHTPATH DOSE#1 BSS IO SOLN
INTRAOCULAR | Status: DC | PRN
  Administered 2023-10-27: 15 mL via INTRAOCULAR

## 2023-10-27 MED ORDER — FENTANYL CITRATE (PF) 100 MCG/2ML IJ SOLN
INTRAMUSCULAR | Status: DC | PRN
  Administered 2023-10-27: 50 ug via INTRAVENOUS

## 2023-10-27 MED ORDER — TETRACAINE HCL 0.5 % OP SOLN
1.0000 [drp] | OPHTHALMIC | Status: DC | PRN
  Administered 2023-10-27 (×3): 1 [drp] via OPHTHALMIC

## 2023-10-27 MED ORDER — FENTANYL CITRATE (PF) 100 MCG/2ML IJ SOLN
INTRAMUSCULAR | Status: AC
  Filled 2023-10-27: qty 2

## 2023-10-27 MED ORDER — LIDOCAINE HCL (PF) 2 % IJ SOLN
INTRAOCULAR | Status: DC | PRN
  Administered 2023-10-27: 2 mL

## 2023-10-27 MED ORDER — SIGHTPATH DOSE#1 BSS IO SOLN
INTRAOCULAR | Status: DC | PRN
  Administered 2023-10-27: 56 mL via OPHTHALMIC

## 2023-10-27 MED ORDER — MOXIFLOXACIN HCL 0.5 % OP SOLN
OPHTHALMIC | Status: DC | PRN
  Administered 2023-10-27: .2 mL via OPHTHALMIC

## 2023-10-27 SURGICAL SUPPLY — 13 items
CATARACT SUITE SIGHTPATH (MISCELLANEOUS) ×1 IMPLANT
CYSTOTOME ANGL RVRS SHRT 25G (CUTTER) ×1 IMPLANT
CYSTOTOME ANGL RVRS SHRT 25GA (CUTTER) ×1 IMPLANT
FEE CATARACT SUITE SIGHTPATH (MISCELLANEOUS) ×1 IMPLANT
GLOVE BIOGEL PI IND STRL 8 (GLOVE) ×1 IMPLANT
GLOVE SURG LX STRL 8.0 MICRO (GLOVE) ×1 IMPLANT
GLOVE SURG PROTEXIS BL SZ6.5 (GLOVE) ×1 IMPLANT
GLOVE SURG SYN 6.5 PF PI BL (GLOVE) ×1 IMPLANT
LENS CLRN VIVITY TORIC 3 22.0 ×1 IMPLANT
LENS IOL CLRN VT TRC 3 22.0 IMPLANT
NDL FILTER BLUNT 18X1 1/2 (NEEDLE) ×1 IMPLANT
NEEDLE FILTER BLUNT 18X1 1/2 (NEEDLE) ×1 IMPLANT
SYR 3ML LL SCALE MARK (SYRINGE) ×1 IMPLANT

## 2023-10-27 NOTE — Anesthesia Postprocedure Evaluation (Signed)
 Anesthesia Post Note  Patient: Caleb Smith  Procedure(s) Performed: PHACOEMULSIFICATION, CATARACT, WITH IOL INSERTION 7.72 00:55.4 (Right: Eye)  Patient location during evaluation: PACU Anesthesia Type: MAC Level of consciousness: awake and alert Pain management: pain level controlled Vital Signs Assessment: post-procedure vital signs reviewed and stable Respiratory status: spontaneous breathing, nonlabored ventilation, respiratory function stable and patient connected to nasal cannula oxygen Cardiovascular status: stable and blood pressure returned to baseline Postop Assessment: no apparent nausea or vomiting Anesthetic complications: no   No notable events documented.   Last Vitals:  Vitals:   10/27/23 1251 10/27/23 1256  BP: 127/81 124/82  Pulse: 79 84  Resp: 16 17  Temp: (!) 36.4 C (!) 36.4 C  SpO2: 95% 94%    Last Pain:  Vitals:   10/27/23 1256  PainSc: 0-No pain                 Brinklee Cisse C Marisel Tostenson

## 2023-10-27 NOTE — H&P (Signed)
 [facility]   Primary Care Physician:  Claire Crick, MD Ophthalmologist: Dr. Merrell Abate  Pre-Procedure History & Physical: HPI:  Caleb Smith is a 73 y.o. male here for cataract surgery.   Past Medical History:  Diagnosis Date   Arthritis    Colon cancer (HCC)    Coronary artery calcification seen on CAT scan 08/21/2017   COVID-19 virus infection 07/2020   GERD (gastroesophageal reflux disease) 1995   s/p esoph dilation for stricture   HLD (hyperlipidemia)    Hypothyroidism    Obesity 07/14/2014   OSA on CPAP 08/11/2022    Past Surgical History:  Procedure Laterality Date   CARDIOVASCULAR STRESS TEST  07/2017   low risk study ([facility])   COLON RESECTION  09/05/2020   Procedure: LAPAROSCOPIC  SEGMENTAL COLECTOMY, TAKE DOWN OF SPLEENIC FLEXURE;  Surgeon: Melvenia Stabs, MD;  Location: [facility];  Service: General;;   COLONOSCOPY  03/2003   small sigmoid polyp, rpt 5 yrs Toribio Frees)   COLONOSCOPY  07/2021   polyps, hemorrhoids, congested scar biopsied, single diverticula (Mansouraty)   COLONOSCOPY WITH PROPOFOL  N/A 02/14/2020   SSP Tully Gainer, Zackary Heron B, MD)   COLONOSCOPY WITH PROPOFOL  N/A 06/07/2020   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 (Mansouraty, Albino Alu., MD)   ENDOSCOPIC MUCOSAL RESECTION N/A 06/07/2020   Procedure: ENDOSCOPIC MUCOSAL RESECTION;  Surgeon: Normie Becton., MD;  Location: [facility] ENDOSCOPY;  Service: Gastroenterology;  Laterality: N/A;   ESOPHAGOGASTRODUODENOSCOPY  1995   s/p esophageal dilation   FACIAL RECONSTRUCTION SURGERY  1990s   cow headbutted him   FINGER SURGERY Left teenager   reattachment of 2nd,3rd,4th   HEMOSTASIS CLIP PLACEMENT  06/07/2020   Procedure: HEMOSTASIS CLIP PLACEMENT;  Surgeon: Normie Becton., MD;  Location: [facility] ENDOSCOPY;  Service: Gastroenterology;;   POLYPECTOMY  06/07/2020   Procedure: POLYPECTOMY;  Surgeon: Normie Becton., MD;  Location: [facility] ENDOSCOPY;   Service: Gastroenterology;;   SUBMUCOSAL LIFTING INJECTION  06/07/2020   Procedure: SUBMUCOSAL LIFTING INJECTION;  Surgeon: Normie Becton., MD;  Location: [facility] ENDOSCOPY;  Service: Gastroenterology;;   TONSILLECTOMY     TOTAL KNEE ARTHROPLASTY Right 08/09/2019   TOTAL KNEE ARTHROPLASTY Right 08/09/2019   Procedure: RIGHT TOTAL KNEE ARTHROPLASTY-CEMENTED;  Surgeon: Jasmine Mesi, MD;  Location: [facility] OR;  Service: Orthopedics;  Laterality: Right;    Prior to Admission medications   Medication Sig Start Date End Date Taking? Authorizing Provider  amoxicillin  (AMOXIL ) 500 MG capsule Take 500 mg by mouth 3 (three) times daily. Take prior to dental procedures.   Yes [provider]  Ascorbic Acid (VITAMIN C PO) Take by mouth daily.   Yes [provider]  ASPIRIN  81 PO Take 1 tablet by mouth daily.   Yes [provider]  atorvastatin  (LIPITOR ) 80 MG tablet Take 1 tablet (80 mg total) by mouth daily. 06/08/23  Yes Conte, Tessa N, PA-C  ezetimibe  (ZETIA ) 10 MG tablet Take 1 tablet (10 mg total) by mouth daily. 06/08/23  Yes Conte, Tessa N, PA-C  omeprazole  (PRILOSEC) 40 MG capsule Take 1 capsule (40 mg total) by mouth daily. 12/12/22  Yes Claire Crick, MD  SYNTHROID  100 MCG tablet Take 1 tablet (100 mcg total) by mouth daily before breakfast. 12/12/22  Yes Claire Crick, MD  vitamin B-12 (CYANOCOBALAMIN) 1000 MCG tablet Take 1,000 mcg by mouth daily.   Yes [provider]  VITAMIN D PO Take by mouth daily.   Yes [provider]  benzonatate  (TESSALON ) 100 MG  capsule Take 1 capsule (100 mg total) by mouth 2 (two) times daily as needed for cough (swallow don't chew). Patient not taking: Reported on 10/21/2023 08/07/23   Claire Crick, MD    Allergies as of 10/13/2023   (No Known Allergies)    Family History  Problem Relation Age of Onset   Cancer Mother        breast   Pancreatic cancer Mother    Liver cancer Mother    CAD Maternal  Uncle 42       massive MI   Suicidality Father        suicide   Stroke Neg Hx    Diabetes Neg Hx    Hypertension Neg Hx    Colon cancer Neg Hx    Esophageal cancer Neg Hx    Inflammatory bowel disease Neg Hx    Rectal cancer Neg Hx    Stomach cancer Neg Hx     Social History   Socioeconomic History   Marital status: Married    Spouse name: Not on file   Number of children: Not on file   Years of education: Not on file   Highest education level: Not on file  Occupational History   Not on file  Tobacco Use   Smoking status: Never   Smokeless tobacco: Former    Types: Chew  Vaping Use   Vaping status: Never Used  Substance and Sexual Activity   Alcohol use: Not Currently    Comment: 2-3 beer daily  (10/21/23 - "maybe 4 beers in last 2 months")   Drug use: No   Sexual activity: Not on file  Other Topics Concern   Not on file  Social History Narrative   Lives with wife   Grown children (daughter is Charlsie Cool)   Nephew of the [employer]   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[employer]   Edu: HS   Activity: active on farm   Diet: good water , fruits/vegetables daily   Social Drivers of Corporate investment banker Strain: Not on file  Food Insecurity: Not on file  Transportation Needs: Not on file  Physical Activity: Not on file  Stress: Not on file  Social Connections: Unknown (11/11/2021)   Received from [facility], [facility]   Social Network    Social Network: Not on file  Intimate Partner Violence: Unknown (10/03/2021)   Received from [facility], [facility]   HITS    Physically Hurt: Not on file    Insult or Talk Down To: Not on file    Threaten Physical Harm: Not on file    Scream or Curse: Not on file    Review of Systems: See HPI, otherwise negative ROS  Physical Exam: BP (!) 145/74   Pulse (!) 48   Temp (!) 97.1 F (36.2 C)   Ht 5' 6.5" (1.689 m)   Wt 94.8 kg   SpO2 96%   BMI 33.23 kg/m  General:   Alert, cooperative. Head:   Normocephalic and atraumatic. Respiratory:  Normal work of breathing. Cardiovascular:  NAD  Impression/Plan: Caleb Smith is here for cataract surgery.  Risks, benefits, limitations, and alternatives regarding cataract surgery have been reviewed with the patient.  Questions have been answered.  All parties agreeable.   Clair Crews, MD  10/27/2023, 12:23 PM

## 2023-10-27 NOTE — Transfer of Care (Signed)
 Immediate Anesthesia Transfer of Care Note  Patient: Caleb Smith  Procedure(s) Performed: PHACOEMULSIFICATION, CATARACT, WITH IOL INSERTION 7.72 00:55.4 (Right: Eye)  Patient Location: PACU  Anesthesia Type:MAC  Level of Consciousness: awake and alert   Airway & Oxygen Therapy: Patient Spontanous Breathing  Post-op Assessment: Report given to RN and Post -op Vital signs reviewed and stable  Post vital signs: Reviewed and stable  Last Vitals:  Vitals Value Taken Time  BP 127/81 10/27/23 1251  Temp 36.4 C 10/27/23 1251  Pulse 79 10/27/23 1251  Resp 16 10/27/23 1252  SpO2 95 % 10/27/23 1251  Vitals shown include unfiled device data.  Last Pain:  Vitals:   10/27/23 1251  PainSc: 0-No pain         Complications: No notable events documented.

## 2023-10-27 NOTE — Op Note (Signed)
 PREOPERATIVE DIAGNOSIS:  Nuclear sclerotic cataract of the right eye.   POSTOPERATIVE DIAGNOSIS:  Nuclear sclerotic cataract of the right eye.   OPERATIVE PROCEDURE: Procedure(s): PHACOEMULSIFICATION, CATARACT, WITH IOL INSERTION 7.72 00:55.4   SURGEON:  Clair Crews, MD.   ANESTHESIA: 1.      Managed anesthesia care. 2.     0.8ml of Shugarcaine was instilled following the paracentesis  Anesthesiologist: Emilie Harden, MD CRNA: Karan Osgood, CRNA  COMPLICATIONS:  None.   TECHNIQUE:   Stop and chop    DESCRIPTION OF PROCEDURE:  The patient was examined and consented in the preoperative holding area where the aforementioned topical anesthesia was applied to the right eye.  The patient was brought back to the Operating Room where he was sat upright on the gurney and given a target to fixate upon while the eye was marked at the 3:00 and 9:00 position.  The patient was then reclined on the operating table.  The eye was prepped and draped in the usual sterile ophthalmic fashion and a lid speculum was placed. A paracentesis was created with the side port blade and the anterior chamber was filled with viscoelastic. A near clear corneal incision was performed with the steel keratome. A continuous curvilinear capsulorrhexis was performed with a cystotome followed by the capsulorrhexis forceps. Hydrodissection and hydrodelineation were carried out with BSS on a blunt cannula. The lens was removed in a stop and chop technique and the remaining cortical material was removed with the irrigation-aspiration handpiece. The eye was inflated with viscoelastic and the CNWET3  lens  was placed in the eye and rotated to within a few degrees of the predetermined orientation.  The remaining viscoelastic was removed from the eye.  The Sinskey hook was used to rotate the toric lens into its final resting place at 037 degrees.  0. The eye was inflated to a physiologic pressure and found to be watertight. 0.1ml of  Vigamox was placed in the anterior chamber.  The eye was dressed with Combigan.. The patient was given protective glasses to wear throughout the day and a shield with which to sleep tonight. The patient was also given drops with which to begin a drop regimen today and will follow-up with me in one day. Implant Name Type Inv. Item Serial No. Manufacturer Lot No. LRB No. Used Action  LENS CLRN VIVITY TORIC 3 22.0 - [id number]  LENS CLRN VIVITY TORIC 3 22.0 [id number] [employer]IGHT[employer]  Right 1 Implanted   Procedure(s): PHACOEMULSIFICATION, CATARACT, WITH IOL INSERTION 7.72 00:55.4 (Right)  Electronically signed: Clair Crews 10/27/2023 12:50 PM

## 2023-10-28 ENCOUNTER — Other Ambulatory Visit: Payer: Self-pay | Admitting: Family Medicine

## 2023-10-28 ENCOUNTER — Encounter: Payer: Self-pay | Admitting: Ophthalmology

## 2023-10-31 DIAGNOSIS — G4733 Obstructive sleep apnea (adult) (pediatric): Secondary | ICD-10-CM | POA: Diagnosis not present

## 2023-11-06 NOTE — Discharge Instructions (Signed)

## 2023-11-09 NOTE — Anesthesia Preprocedure Evaluation (Signed)
 Anesthesia Evaluation  Patient identified by MRN, date of birth, ID band Patient awake    Reviewed: Allergy  & Precautions, H&P , NPO status , Patient's Chart, lab work & pertinent test results  Airway Mallampati: III  TM Distance: >3 FB Neck ROM: Full    Dental no notable dental hx. (+) Caps   Pulmonary neg pulmonary ROS, sleep apnea    Pulmonary exam normal breath sounds clear to auscultation       Cardiovascular + CAD  negative cardio ROS Normal cardiovascular exam Rhythm:Regular Rate:Normal     Neuro/Psych negative neurological ROS  negative psych ROS   GI/Hepatic negative GI ROS, Neg liver ROS,GERD  ,,  Endo/Other  negative endocrine ROSHypothyroidism    Renal/GU negative Renal ROS  negative genitourinary   Musculoskeletal negative musculoskeletal ROS (+) Arthritis ,    Abdominal   Peds negative pediatric ROS (+)  Hematology negative hematology ROS (+)   Anesthesia Other Findings Previous cataract surgery 10-27-23 Dr. Aldo Amble   Hypothyroidism  HLD (hyperlipidemia) GERD (gastroesophageal reflux disease) Obesity Coronary artery calcification seen on CAT scan  Arthritis Colon cancer (HCC)  Hx COVID-19 virus infection OSA on CPAP     Reproductive/Obstetrics negative OB ROS                              Anesthesia Physical Anesthesia Plan  ASA: 3  Anesthesia Plan: MAC   Post-op Pain Management:    Induction: Intravenous  PONV Risk Score and Plan:   Airway Management Planned: Natural Airway and Nasal Cannula  Additional Equipment:   Intra-op Plan:   Post-operative Plan:   Informed Consent: I have reviewed the patients History and Physical, chart, labs and discussed the procedure including the risks, benefits and alternatives for the proposed anesthesia with the patient or authorized representative who has indicated his/her understanding and acceptance.     Dental  Advisory Given  Plan Discussed with: Anesthesiologist, CRNA and Surgeon  Anesthesia Plan Comments: (Patient consented for risks of anesthesia including but not limited to:  - adverse reactions to medications - damage to eyes, teeth, lips or other oral mucosa - nerve damage due to positioning  - sore throat or hoarseness - Damage to heart, brain, nerves, lungs, other parts of body or loss of life  Patient voiced understanding and assent.)         Anesthesia Quick Evaluation

## 2023-11-10 ENCOUNTER — Ambulatory Visit: Payer: Self-pay | Admitting: Anesthesiology

## 2023-11-10 ENCOUNTER — Encounter: Payer: Self-pay | Admitting: Ophthalmology

## 2023-11-10 ENCOUNTER — Other Ambulatory Visit: Payer: Self-pay

## 2023-11-10 ENCOUNTER — Encounter
Admission: RE | Disposition: A | Discharge status: Home / Self Care | Payer: Self-pay | Source: Home / Self Care | Attending: Ophthalmology

## 2023-11-10 ENCOUNTER — Ambulatory Visit
Admission: RE | Admit: 2023-11-10 | Discharge: 2023-11-10 | Disposition: A | Discharge status: Home / Self Care | Attending: Ophthalmology | Admitting: Ophthalmology

## 2023-11-10 DIAGNOSIS — K219 Gastro-esophageal reflux disease without esophagitis: Secondary | ICD-10-CM | POA: Insufficient documentation

## 2023-11-10 DIAGNOSIS — H2512 Age-related nuclear cataract, left eye: Secondary | ICD-10-CM | POA: Diagnosis not present

## 2023-11-10 DIAGNOSIS — I2510 Atherosclerotic heart disease of native coronary artery without angina pectoris: Secondary | ICD-10-CM | POA: Diagnosis not present

## 2023-11-10 DIAGNOSIS — G4733 Obstructive sleep apnea (adult) (pediatric): Secondary | ICD-10-CM | POA: Insufficient documentation

## 2023-11-10 DIAGNOSIS — E039 Hypothyroidism, unspecified: Secondary | ICD-10-CM | POA: Diagnosis not present

## 2023-11-10 HISTORY — PX: CATARACT EXTRACTION W/PHACO: SHX586

## 2023-11-10 SURGERY — PHACOEMULSIFICATION, CATARACT, WITH IOL INSERTION
Anesthesia: Monitor Anesthesia Care | Site: Eye | Laterality: Left

## 2023-11-10 MED ORDER — SIGHTPATH DOSE#1 NA CHONDROIT SULF-NA HYALURON 40-17 MG/ML IO SOLN
INTRAOCULAR | Status: DC | PRN
  Administered 2023-11-10: 1 mL via INTRAOCULAR

## 2023-11-10 MED ORDER — ARMC OPHTHALMIC DILATING DROPS
1.0000 | OPHTHALMIC | Status: DC | PRN
  Administered 2023-11-10 (×3): 1 via OPHTHALMIC

## 2023-11-10 MED ORDER — SIGHTPATH DOSE#1 BSS IO SOLN
INTRAOCULAR | Status: DC | PRN
  Administered 2023-11-10: 72 mL via OPHTHALMIC

## 2023-11-10 MED ORDER — ARMC OPHTHALMIC DILATING DROPS
OPHTHALMIC | Status: AC
  Filled 2023-11-10: qty 0.5

## 2023-11-10 MED ORDER — BRIMONIDINE TARTRATE-TIMOLOL 0.2-0.5 % OP SOLN
OPHTHALMIC | Status: DC | PRN
  Administered 2023-11-10: 1 [drp] via OPHTHALMIC

## 2023-11-10 MED ORDER — MIDAZOLAM HCL 2 MG/2ML IJ SOLN
INTRAMUSCULAR | Status: AC
Start: 2023-11-10 — End: ?
  Filled 2023-11-10: qty 2

## 2023-11-10 MED ORDER — FENTANYL CITRATE (PF) 100 MCG/2ML IJ SOLN
INTRAMUSCULAR | Status: AC
  Filled 2023-11-10: qty 2

## 2023-11-10 MED ORDER — SIGHTPATH DOSE#1 BSS IO SOLN
INTRAOCULAR | Status: DC | PRN
  Administered 2023-11-10: 15 mL via INTRAOCULAR

## 2023-11-10 MED ORDER — FENTANYL CITRATE (PF) 100 MCG/2ML IJ SOLN
INTRAMUSCULAR | Status: DC | PRN
  Administered 2023-11-10 (×2): 50 ug via INTRAVENOUS

## 2023-11-10 MED ORDER — MIDAZOLAM HCL 2 MG/2ML IJ SOLN
INTRAMUSCULAR | Status: DC | PRN
  Administered 2023-11-10 (×2): 1 mg via INTRAVENOUS

## 2023-11-10 MED ORDER — MOXIFLOXACIN HCL 0.5 % OP SOLN
OPHTHALMIC | Status: DC | PRN
  Administered 2023-11-10: .2 mL via OPHTHALMIC

## 2023-11-10 MED ORDER — LIDOCAINE HCL (PF) 2 % IJ SOLN
INTRAOCULAR | Status: DC | PRN
  Administered 2023-11-10: 2 mL

## 2023-11-10 MED ORDER — TETRACAINE HCL 0.5 % OP SOLN
OPHTHALMIC | Status: AC
  Filled 2023-11-10: qty 4

## 2023-11-10 MED ORDER — TETRACAINE HCL 0.5 % OP SOLN
1.0000 [drp] | OPHTHALMIC | Status: DC | PRN
  Administered 2023-11-10 (×4): 1 [drp] via OPHTHALMIC

## 2023-11-10 SURGICAL SUPPLY — 20 items
CANNULA ANT/CHMB 27G (MISCELLANEOUS) IMPLANT
CANNULA ANT/CHMB 27GA (MISCELLANEOUS) IMPLANT
CATARACT SUITE SIGHTPATH (MISCELLANEOUS) ×1 IMPLANT
CYSTOTOME ANGL RVRS SHRT 25G (CUTTER) ×1 IMPLANT
CYSTOTOME ANGL RVRS SHRT 25GA (CUTTER) ×1 IMPLANT
FEE CATARACT SUITE SIGHTPATH (MISCELLANEOUS) ×1 IMPLANT
GLOVE BIOGEL PI IND STRL 8 (GLOVE) ×1 IMPLANT
GLOVE SURG LX STRL 8.0 MICRO (GLOVE) ×1 IMPLANT
GLOVE SURG PROTEXIS BL SZ6.5 (GLOVE) ×1 IMPLANT
GLOVE SURG SYN 6.5 PF PI BL (GLOVE) ×1 IMPLANT
GLOVE SURG SYN 8.0 (GLOVE) IMPLANT
GLOVE SURG SYN 8.0 PF PI (GLOVE) IMPLANT
LENS CLRN VIVITY TORIC 3 22.0 ×1 IMPLANT
LENS IOL CLRN VT TRC 3 22.0 IMPLANT
NDL FILTER BLUNT 18X1 1/2 (NEEDLE) ×1 IMPLANT
NEEDLE FILTER BLUNT 18X1 1/2 (NEEDLE) ×1 IMPLANT
PACK VIT ANT 23G (MISCELLANEOUS) IMPLANT
RING MALYGIN (MISCELLANEOUS) IMPLANT
SUTURE EHLN 10-0 CS-B-6CS-B-6 (SUTURE) IMPLANT
SYR 3ML LL SCALE MARK (SYRINGE) ×1 IMPLANT

## 2023-11-10 NOTE — Op Note (Signed)
 PREOPERATIVE DIAGNOSIS:  Nuclear sclerotic cataract of the left eye.   POSTOPERATIVE DIAGNOSIS:  Nuclear sclerotic cataract of the left eye.   OPERATIVE PROCEDURE: Procedure(s): PHACOEMULSIFICATION, CATARACT, WITH IOL INSERTION 10.80 01:05.4   SURGEON:  Clair Crews, MD.   ANESTHESIA: 1.      Managed anesthesia care. 2.     0.12ml os Shugarcaine was instilled following the paracentesis [email or web address]   COMPLICATIONS:  None.   TECHNIQUE:   Stop and chop    DESCRIPTION OF PROCEDURE:  The patient was examined and consented in the preoperative holding area where the aforementioned topical anesthesia was applied to the left eye.  The patient was brought back to the Operating Room where he was sat upright on the gurney and given a target to fixate upon while the eye was marked at the 3:00 and 9:00 position.  The patient was then reclined on the operating table.  The eye was prepped and draped in the usual sterile ophthalmic fashion and a lid speculum was placed. A paracentesis was created with the side port blade and the anterior chamber was filled with viscoelastic. A near clear corneal incision was performed with the steel keratome. A continuous curvilinear capsulorrhexis was performed with a cystotome followed by the capsulorrhexis forceps. Hydrodissection and hydrodelineation were carried out with BSS on a blunt cannula. The lens was removed in a stop and chop technique and the remaining cortical material was removed with the irrigation-aspiration handpiece. The eye was inflated with viscoelastic and the ZCT lens was placed in the eye and rotated to within a few degrees of the predetermined orientation.  The remaining viscoelastic was removed from the eye.  The Sinskey hook was used to rotate the toric lens into its final resting place at 087 degrees.  0.1 ml of Vigamox  was placed in the anterior chamber. The eye was inflated to a physiologic pressure and found to be watertight.  The eye was  dressed with Combigan . The patient was given protective glasses to wear throughout the day and a shield with which to sleep tonight. The patient was also given drops with which to begin a drop regimen today and will follow-up with me in one day. Implant Name Type Inv. Item Serial No. Manufacturer Lot No. LRB No. Used Action  LENS CLRN VIVITY TORIC 3 22.0 - [id number]  LENS CLRN VIVITY TORIC 3 22.0 [id number] [employer]  Left 1 Implanted   Procedure(s): PHACOEMULSIFICATION, CATARACT, WITH IOL INSERTION 10.80 01:05.4 (Left)  Electronically signed: Clair Crews 5/13/20258:07 AM

## 2023-11-10 NOTE — Anesthesia Postprocedure Evaluation (Signed)
 Anesthesia Post Note  Patient: Caleb Smith  Procedure(s) Performed: PHACOEMULSIFICATION, CATARACT, WITH IOL INSERTION 10.80 01:05.4 (Left: Eye)  Patient location during evaluation: PACU Anesthesia Type: MAC Level of consciousness: awake and alert Pain management: pain level controlled Vital Signs Assessment: post-procedure vital signs reviewed and stable Respiratory status: spontaneous breathing, nonlabored ventilation, respiratory function stable and patient connected to nasal cannula oxygen Cardiovascular status: stable and blood pressure returned to baseline Postop Assessment: no apparent nausea or vomiting Anesthetic complications: no   No notable events documented.   Last Vitals:  Vitals:   11/10/23 0807 11/10/23 0812  BP: 126/76 132/74  Pulse: (!) 51 (!) 54  Resp: 17 10  Temp: (!) 36.1 C (!) 36.1 C  SpO2: 94% 94%    Last Pain:  Vitals:   11/10/23 0812  TempSrc:   PainSc: 0-No pain                 Emilie Harden

## 2023-11-10 NOTE — H&P (Signed)
 [facility]   Primary Care Physician:  Claire Crick, MD Ophthalmologist: Dr. Merrell Abate  Pre-Procedure History & Physical: HPI:  Caleb Smith is a 73 y.o. male here for cataract surgery.   Past Medical History:  Diagnosis Date   Arthritis    Colon cancer (HCC)    Coronary artery calcification seen on CAT scan 08/21/2017   COVID-19 virus infection 07/2020   GERD (gastroesophageal reflux disease) 1995   s/p esoph dilation for stricture   HLD (hyperlipidemia)    Hypothyroidism    Obesity 07/14/2014   OSA on CPAP 08/11/2022    Past Surgical History:  Procedure Laterality Date   CARDIOVASCULAR STRESS TEST  07/2017   low risk study ([facility])   CATARACT EXTRACTION W/PHACO Right 10/27/2023   Procedure: PHACOEMULSIFICATION, CATARACT, WITH IOL INSERTION 7.72 00:55.4;  Surgeon: Clair Crews, MD;  [facility];  Service: Ophthalmology;  Laterality: Right;   COLON RESECTION  09/05/2020   Procedure: LAPAROSCOPIC  SEGMENTAL COLECTOMY, TAKE DOWN OF SPLEENIC FLEXURE;  Surgeon: Melvenia Stabs, MD;  Location: [facility];  Service: General;;   COLONOSCOPY  03/2003   small sigmoid polyp, rpt 5 yrs Toribio Frees)   COLONOSCOPY  07/2021   polyps, hemorrhoids, congested scar biopsied, single diverticula (Mansouraty)   COLONOSCOPY WITH PROPOFOL  N/A 02/14/2020   SSP Tully Gainer, Zackary Heron B, MD)   COLONOSCOPY WITH PROPOFOL  N/A 06/07/2020   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 (Mansouraty, Albino Alu., MD)   ENDOSCOPIC MUCOSAL RESECTION N/A 06/07/2020   Procedure: ENDOSCOPIC MUCOSAL RESECTION;  Surgeon: Normie Becton., MD;  Location: [facility] ENDOSCOPY;  Service: Gastroenterology;  Laterality: N/A;   ESOPHAGOGASTRODUODENOSCOPY  1995   s/p esophageal dilation   FACIAL RECONSTRUCTION SURGERY  1990s   cow headbutted him   FINGER SURGERY Left teenager   reattachment of 2nd,3rd,4th   HEMOSTASIS CLIP PLACEMENT  06/07/2020   Procedure:  HEMOSTASIS CLIP PLACEMENT;  Surgeon: Normie Becton., MD;  Location: [facility] ENDOSCOPY;  Service: Gastroenterology;;   POLYPECTOMY  06/07/2020   Procedure: POLYPECTOMY;  Surgeon: Normie Becton., MD;  Location: [facility] ENDOSCOPY;  Service: Gastroenterology;;   SUBMUCOSAL LIFTING INJECTION  06/07/2020   Procedure: SUBMUCOSAL LIFTING INJECTION;  Surgeon: Normie Becton., MD;  Location: [facility] ENDOSCOPY;  Service: Gastroenterology;;   TONSILLECTOMY     TOTAL KNEE ARTHROPLASTY Right 08/09/2019   TOTAL KNEE ARTHROPLASTY Right 08/09/2019   Procedure: RIGHT TOTAL KNEE ARTHROPLASTY-CEMENTED;  Surgeon: Jasmine Mesi, MD;  Location: [facility] OR;  Service: Orthopedics;  Laterality: Right;    Prior to Admission medications   Medication Sig Start Date End Date Taking? Authorizing Provider  amoxicillin  (AMOXIL ) 500 MG capsule Take 500 mg by mouth 3 (three) times daily. Take prior to dental procedures.   Yes [provider]  Ascorbic Acid (VITAMIN C PO) Take by mouth daily.   Yes [provider]  ASPIRIN  81 PO Take 1 tablet by mouth daily.   Yes [provider]  atorvastatin  (LIPITOR ) 80 MG tablet Take 1 tablet (80 mg total) by mouth daily. 06/08/23  Yes Conte, Tessa N, PA-C  benzonatate  (TESSALON ) 100 MG capsule TAKE 1 CAPSULE (100 MG TOTAL) BY MOUTH 2 (TWO) TIMES DAILY AS NEEDED FOR COUGH (SWALLOW DON'T CHEW). 10/28/23  Yes Claire Crick, MD  ezetimibe  (ZETIA ) 10 MG tablet Take 1 tablet (10 mg total) by mouth daily. 06/08/23  Yes Conte, Tessa N, PA-C  omeprazole  (PRILOSEC) 40 MG capsule Take 1 capsule (40 mg total) by mouth daily. 12/12/22  Yes Claire Crick, MD  SYNTHROID  100 MCG tablet Take 1 tablet (100 mcg total) by mouth daily before breakfast. 12/12/22  Yes Claire Crick, MD  vitamin B-12 (CYANOCOBALAMIN) 1000 MCG tablet Take 1,000 mcg by mouth daily.   Yes [provider]  VITAMIN D PO Take by mouth daily.   Yes [provider]     Allergies as of 10/13/2023   (No Known Allergies)    Family History  Problem Relation Age of Onset   Cancer Mother        breast   Pancreatic cancer Mother    Liver cancer Mother    CAD Maternal Uncle 13       massive MI   Suicidality Father        suicide   Stroke Neg Hx    Diabetes Neg Hx    Hypertension Neg Hx    Colon cancer Neg Hx    Esophageal cancer Neg Hx    Inflammatory bowel disease Neg Hx    Rectal cancer Neg Hx    Stomach cancer Neg Hx     Social History   Socioeconomic History   Marital status: Married    Spouse name: Not on file   Number of children: Not on file   Years of education: Not on file   Highest education level: Not on file  Occupational History   Not on file  Tobacco Use   Smoking status: Never   Smokeless tobacco: Former    Types: Chew  Vaping Use   Vaping status: Never Used  Substance and Sexual Activity   Alcohol use: Not Currently    Comment: 2-3 beer daily  (10/21/23 - "maybe 4 beers in last 2 months")   Drug use: No   Sexual activity: Not on file  Other Topics Concern   Not on file  Social History Narrative   Lives with wife   Grown children (daughter is Charlsie Cool)   Nephew of the [employer]   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[employer]   Edu: HS   Activity: active on farm   Diet: good water , fruits/vegetables daily   Social Drivers of Corporate investment banker Strain: Not on file  Food Insecurity: Not on file  Transportation Needs: Not on file  Physical Activity: Not on file  Stress: Not on file  Social Connections: Unknown (11/11/2021)   Received from [facility]   Social Network    Social Network: Not on file  Intimate Partner Violence: Unknown (10/03/2021)   Received from [facility], [facility]   HITS    Physically Hurt: Not on file    Insult or Talk Down To: Not on file    Threaten Physical Harm: Not on file    Scream or Curse: Not on file    Review of Systems: See HPI,  otherwise negative ROS  Physical Exam: BP 129/80   Temp (!) 97.2 F (36.2 C) (Temporal)   Resp 16   Ht 5' 6.5" (1.689 m)   Wt 95.9 kg   SpO2 93%   BMI 33.63 kg/m  General:   Alert, cooperative. Head:  Normocephalic and atraumatic. Respiratory:  Normal work of breathing. Cardiovascular:  NAD  Impression/Plan: Gionny Heidorn is here for cataract surgery.  Risks, benefits, limitations, and alternatives regarding cataract surgery have been reviewed with the patient.  Questions have been answered.  All parties agreeable.   Clair Crews, MD  11/10/2023, 7:43 AM

## 2023-11-10 NOTE — Transfer of Care (Signed)
 Immediate Anesthesia Transfer of Care Note  Patient: Caleb Smith  Procedure(s) Performed: PHACOEMULSIFICATION, CATARACT, WITH IOL INSERTION 10.80 01:05.4 (Left: Eye)  Patient Location: PACU  Anesthesia Type: MAC  Level of Consciousness: awake, alert  and patient cooperative  Airway and Oxygen Therapy: Patient Spontanous Breathing and Patient connected to supplemental oxygen  Post-op Assessment: Post-op Vital signs reviewed, Patient's Cardiovascular Status Stable, Respiratory Function Stable, Patent Airway and No signs of Nausea or vomiting  Post-op Vital Signs: Reviewed and stable  Complications: No notable events documented.

## 2023-12-01 DIAGNOSIS — G4733 Obstructive sleep apnea (adult) (pediatric): Secondary | ICD-10-CM | POA: Diagnosis not present

## 2023-12-07 ENCOUNTER — Other Ambulatory Visit: Payer: Self-pay | Admitting: Family Medicine

## 2023-12-07 DIAGNOSIS — E7800 Pure hypercholesterolemia, unspecified: Secondary | ICD-10-CM

## 2023-12-07 DIAGNOSIS — F1090 Alcohol use, unspecified, uncomplicated: Secondary | ICD-10-CM

## 2023-12-07 DIAGNOSIS — E039 Hypothyroidism, unspecified: Secondary | ICD-10-CM

## 2023-12-07 DIAGNOSIS — R7303 Prediabetes: Secondary | ICD-10-CM

## 2023-12-09 ENCOUNTER — Other Ambulatory Visit (INDEPENDENT_AMBULATORY_CARE_PROVIDER_SITE_OTHER): Payer: PPO

## 2023-12-09 DIAGNOSIS — E7800 Pure hypercholesterolemia, unspecified: Secondary | ICD-10-CM | POA: Diagnosis not present

## 2023-12-09 DIAGNOSIS — F1090 Alcohol use, unspecified, uncomplicated: Secondary | ICD-10-CM | POA: Diagnosis not present

## 2023-12-09 DIAGNOSIS — R7303 Prediabetes: Secondary | ICD-10-CM | POA: Diagnosis not present

## 2023-12-09 DIAGNOSIS — E039 Hypothyroidism, unspecified: Secondary | ICD-10-CM | POA: Diagnosis not present

## 2023-12-09 NOTE — Addendum Note (Signed)
 Addended by: Gerry Krone on: 12/09/2023 08:16 AM   Modules accepted: Orders

## 2023-12-10 ENCOUNTER — Ambulatory Visit: Payer: Self-pay | Admitting: Family Medicine

## 2023-12-10 LAB — COMPREHENSIVE METABOLIC PANEL WITH GFR
AG Ratio: 1.8 (calc) (ref 1.0–2.5)
ALT: 27 U/L (ref 9–46)
AST: 18 U/L (ref 10–35)
Albumin: 4.3 g/dL (ref 3.6–5.1)
Alkaline phosphatase (APISO): 66 U/L (ref 35–144)
BUN: 16 mg/dL (ref 7–25)
CO2: 25 mmol/L (ref 20–32)
Calcium: 9.4 mg/dL (ref 8.6–10.3)
Chloride: 106 mmol/L (ref 98–110)
Creat: 1.12 mg/dL (ref 0.70–1.28)
Globulin: 2.4 g/dL (ref 1.9–3.7)
Glucose, Bld: 94 mg/dL (ref 65–99)
Potassium: 4.9 mmol/L (ref 3.5–5.3)
Sodium: 140 mmol/L (ref 135–146)
Total Bilirubin: 0.4 mg/dL (ref 0.2–1.2)
Total Protein: 6.7 g/dL (ref 6.1–8.1)
eGFR: 70 mL/min/{1.73_m2} (ref >=60)

## 2023-12-10 LAB — LIPID PANEL
Cholesterol: 127 mg/dL (ref <200)
HDL: 56 mg/dL (ref >=40)
LDL Cholesterol (Calc): 57 mg/dL
Non-HDL Cholesterol (Calc): 71 mg/dL (ref <130)
Total CHOL/HDL Ratio: 2.3 (calc) (ref <5.0)
Triglycerides: 62 mg/dL (ref <150)

## 2023-12-10 LAB — CBC WITH DIFFERENTIAL/PLATELET
Absolute Lymphocytes: 1482 {cells}/uL (ref 850–3900)
Absolute Monocytes: 781 {cells}/uL (ref 200–950)
Basophils Absolute: 31 {cells}/uL (ref 0–200)
Basophils Relative: 0.5 %
Eosinophils Absolute: 183 {cells}/uL (ref 15–500)
Eosinophils Relative: 3 %
HCT: 45.8 % (ref 38.5–50.0)
Hemoglobin: 14.9 g/dL (ref 13.2–17.1)
MCH: 31 pg (ref 27.0–33.0)
MCHC: 32.5 g/dL (ref 32.0–36.0)
MCV: 95.4 fL (ref 80.0–100.0)
MPV: 10.1 fL (ref 7.5–12.5)
Monocytes Relative: 12.8 %
Neutro Abs: 3623 {cells}/uL (ref 1500–7800)
Neutrophils Relative %: 59.4 %
Platelets: 260 10*3/uL (ref 140–400)
RBC: 4.8 10*6/uL (ref 4.20–5.80)
RDW: 12.9 % (ref 11.0–15.0)
Total Lymphocyte: 24.3 %
WBC: 6.1 10*3/uL (ref 3.8–10.8)

## 2023-12-10 LAB — HEMOGLOBIN A1C
Hgb A1c MFr Bld: 6.1 % — ABNORMAL HIGH (ref <5.7)
Mean Plasma Glucose: 128 mg/dL
eAG (mmol/L): 7.1 mmol/L

## 2023-12-10 LAB — FOLATE: Folate: 9.6 ng/mL

## 2023-12-10 LAB — VITAMIN B12: Vitamin B-12: 671 pg/mL (ref 200–1100)

## 2023-12-10 LAB — TSH: TSH: 2 m[IU]/L (ref 0.40–4.50)

## 2023-12-14 LAB — VITAMIN B1: Vitamin B1 (Thiamine): 10 nmol/L (ref 8–30)

## 2023-12-16 ENCOUNTER — Ambulatory Visit: Payer: PPO | Admitting: Family Medicine

## 2023-12-16 ENCOUNTER — Encounter: Payer: Self-pay | Admitting: Family Medicine

## 2023-12-16 VITALS — BP 138/70 | HR 57 | Temp 97.7°F | Ht 65.5 in | Wt 216.2 lb

## 2023-12-16 DIAGNOSIS — E7800 Pure hypercholesterolemia, unspecified: Secondary | ICD-10-CM | POA: Diagnosis not present

## 2023-12-16 DIAGNOSIS — K219 Gastro-esophageal reflux disease without esophagitis: Secondary | ICD-10-CM

## 2023-12-16 DIAGNOSIS — C184 Malignant neoplasm of transverse colon: Secondary | ICD-10-CM

## 2023-12-16 DIAGNOSIS — F1090 Alcohol use, unspecified, uncomplicated: Secondary | ICD-10-CM | POA: Diagnosis not present

## 2023-12-16 DIAGNOSIS — R7303 Prediabetes: Secondary | ICD-10-CM

## 2023-12-16 DIAGNOSIS — L570 Actinic keratosis: Secondary | ICD-10-CM

## 2023-12-16 DIAGNOSIS — E039 Hypothyroidism, unspecified: Secondary | ICD-10-CM | POA: Diagnosis not present

## 2023-12-16 DIAGNOSIS — Z0000 Encounter for general adult medical examination without abnormal findings: Secondary | ICD-10-CM

## 2023-12-16 DIAGNOSIS — G4733 Obstructive sleep apnea (adult) (pediatric): Secondary | ICD-10-CM

## 2023-12-16 DIAGNOSIS — Z7189 Other specified counseling: Secondary | ICD-10-CM

## 2023-12-16 DIAGNOSIS — Z125 Encounter for screening for malignant neoplasm of prostate: Secondary | ICD-10-CM | POA: Diagnosis not present

## 2023-12-16 LAB — PSA: PSA: 1.21 ng/mL (ref 0.10–4.00)

## 2023-12-16 MED ORDER — SYNTHROID 100 MCG PO TABS: 100.0000 ug | ORAL_TABLET | Freq: Every day | ORAL | 4 refills | Status: AC

## 2023-12-16 MED ORDER — OMEPRAZOLE 40 MG PO CPDR: 40.0000 mg | DELAYED_RELEASE_CAPSULE | Freq: Every day | ORAL | 4 refills | Status: AC

## 2023-12-16 MED ORDER — VITAMIN D3 25 MCG (1000 UT) PO CAPS
1.0000 | ORAL_CAPSULE | Freq: Every day | ORAL | Status: DC
Start: 2023-12-16 — End: 2023-12-16

## 2023-12-16 MED ORDER — VITAMIN D3 50 MCG (2000 UT) PO CAPS: 2000.0000 [IU] | ORAL_CAPSULE | Freq: Every day | ORAL | Status: AC

## 2023-12-16 MED ORDER — FISH OIL 1200 MG PO CAPS: 1.0000 | ORAL_CAPSULE | Freq: Every day | ORAL | Status: AC

## 2023-12-16 NOTE — Assessment & Plan Note (Signed)
 Chronic, stable on daily PPI. Breakthrough symptoms if dose missed.

## 2023-12-16 NOTE — Assessment & Plan Note (Signed)
 Chronic, on CPAP. This is managed by cardiology.

## 2023-12-16 NOTE — Patient Instructions (Addendum)
 You are doing well today.  Consider dermatology evaluation for rash on arms.  Work on Scientist, water quality. Bring me a copy when completed.  Good to see you today Return as needed or in 1 year for next wellness visit/physical

## 2023-12-16 NOTE — Assessment & Plan Note (Signed)
 Chronic, stable. Continue current regimen.

## 2023-12-16 NOTE — Assessment & Plan Note (Addendum)
 Significant to forearms, some on face. Discussed this, suggested dermatology referral. He will consider.

## 2023-12-16 NOTE — Assessment & Plan Note (Addendum)
 Preventative protocols reviewed and updated unless pt declined. Discussed healthy diet and lifestyle.  PSA was not checked with recent labs. Will order PSA today - no charge for for blood draw.

## 2023-12-16 NOTE — Assessment & Plan Note (Signed)

## 2023-12-16 NOTE — Assessment & Plan Note (Signed)
 Encouraged he complete and bring us  copy. Planning to see lawyer.

## 2023-12-16 NOTE — Assessment & Plan Note (Signed)
 Congratulated on cutting back on alcohol intake.

## 2023-12-16 NOTE — Assessment & Plan Note (Signed)
 Encourage healthy diet and lifestyle choices to affect sustainable weight loss

## 2023-12-16 NOTE — Assessment & Plan Note (Signed)
 Reviewed limiting added sugars and carbs.

## 2023-12-16 NOTE — Progress Notes (Addendum)
 Ph: [phone] Fax: [phone]   Patient ID: Caleb Smith, male    DOB: [birth year], 73 y.o.   MRN: [id number]  This visit was conducted in person.  BP 138/70   Pulse (!) 57   Temp 97.7 F (36.5 C) (Oral)   Ht 5' 5.5 (1.664 m)   Wt 216 lb 4 oz (98.1 kg)   SpO2 96%   BMI 35.44 kg/m   BP Readings from Last 3 Encounters:  12/16/23 138/70  11/10/23 132/74  10/27/23 124/82   CC: AMW/CPE Subjective:   HPI: Caleb Smith is a 73 y.o. male presenting on 12/16/2023 for Medicare Wellness   Did not see health advisor this year. Completed cataract surgery in the last 2 months and doing well with this.  Hearing Screening - Comments:: Wears B hearing aids. Wearing at today's OV. Vision Screening - Comments:: Last eye exam, 10/2023.  Flowsheet Row Office Visit from 12/12/2022 in [facility] [place]  PHQ-2 Total Score 0       12/12/2022    8:07 AM 08/11/2022   12:03 PM  Fall Risk   Falls in the past year? 0 0   Got medicare part B 05/2022. Welcome to [facility] 11/2022.  Continues working part time. Had DOT physical last month.   Colon cancer 2021 s/p colon cancer resection. Margins uninvolved by cancer, 0/17 LN positive for cancer. Continues seeing GI (Mansouraty) and gen surg (White).   CT from 05/2020 incidentally showed aortic ATH and CAC, as well as prostate enlargement. Stress test 2019 without ischemia. Established with cardiology Micael Adas) - continues aspirin  and statin with LDL goal <70.   OSA - on CPAP followed by cardiology (Dr Cecil Code Lovette Rud PA).   GERD - continues daily PPI, breakthrough symptoms if dosed less frequently.   States ortho wanted him to use abx px for dental procedures after knee replacement surgery 2021 Rozelle Corning) .  Preventative: COLONOSCOPY 06/07/2020 - 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 (Mansouraty, Albino Alu., MD)  COLONOSCOPY 07/2021 - TA/SSPs, hemorrhoids, congested  scar biopsied, single diverticula, rpt 3 yrs (Mansouraty)  Prostate cancer screening - yearly PSA. No significant BPH symptoms. No fmhx prostate cancer.  Lung cancer screening - not eligible  Flu shot has not recently received  COVID vaccine - J&J 09/2019, Pfizer booster 06/2020  Pneumovax 2013, 10/2019, prevnar-13 06/2017.  Td 2011, Tdap 11/2020 Zostavax 2013  Shingrix - 01/2018, 06/2018 Advanced directive - does not have set up. Would want wife then daughters to be HCPOA. Full code. Does not want prolonged life support if terminal condition. Packet previously provided.  Sleep - averaging 7 hours/night Seat belt use discussed  Sunscreen use discussed. No changing moles on skin.  Non smoker  Alcohol - 3-4 beers/day, more on weekends. No liquor. Cut down on alcohol since Christmas 2024.  Dentist - q6 mo Eye exam - yearly  - recent cataract surgery Bowel - no constipation  Bladder - no incontinence   Lives with wife Grown children Occupation: Gen foreman/lineman for [employer]: HS Activity: active on farm, hay  Diet: good water , fruits/vegetables daily      Relevant past medical, surgical, family and social history reviewed and updated as indicated. Interim medical history since our last visit reviewed. Allergies and medications reviewed and updated. Outpatient Medications Prior to Visit  Medication Sig Dispense Refill   amoxicillin  (AMOXIL ) 500 MG capsule Take 500 mg by mouth 3 (three) times daily. Take prior  to dental procedures.     Ascorbic Acid (VITAMIN C PO) Take by mouth daily.     ASPIRIN  81 PO Take 1 tablet by mouth daily.     atorvastatin  (LIPITOR ) 80 MG tablet Take 1 tablet (80 mg total) by mouth daily. 90 tablet 3   ezetimibe  (ZETIA ) 10 MG tablet Take 1 tablet (10 mg total) by mouth daily. 90 tablet 3   vitamin B-12 (CYANOCOBALAMIN) 1000 MCG tablet Take 1,000 mcg by mouth daily.     omeprazole  (PRILOSEC) 40 MG capsule Take 1 capsule (40 mg total) by mouth daily. 90  capsule 4   SYNTHROID  100 MCG tablet Take 1 tablet (100 mcg total) by mouth daily before breakfast. 90 tablet 4   VITAMIN D PO Take by mouth daily.     benzonatate  (TESSALON ) 100 MG capsule TAKE 1 CAPSULE (100 MG TOTAL) BY MOUTH 2 (TWO) TIMES DAILY AS NEEDED FOR COUGH (SWALLOW DON'T CHEW). 30 capsule 0   No facility-administered medications prior to visit.     Per HPI unless specifically indicated in ROS section below Review of Systems  Constitutional:  Negative for activity change, appetite change, chills, fatigue, fever and unexpected weight change.  HENT:  Negative for hearing loss.   Eyes:  Negative for visual disturbance.  Respiratory:  Negative for cough, chest tightness, shortness of breath and wheezing.   Cardiovascular:  Negative for chest pain, palpitations and leg swelling.  Gastrointestinal:  Negative for abdominal distention, abdominal pain, blood in stool, constipation, diarrhea, nausea and vomiting.  Genitourinary:  Negative for difficulty urinating and hematuria.  Musculoskeletal:  Negative for arthralgias, myalgias and neck pain.  Skin:  Negative for rash.  Neurological:  Negative for dizziness, seizures, syncope and headaches.  Hematological:  Negative for adenopathy. Does not bruise/bleed easily.  Psychiatric/Behavioral:  Negative for dysphoric mood. The patient is not nervous/anxious.     Objective:  BP 138/70   Pulse (!) 57   Temp 97.7 F (36.5 C) (Oral)   Ht 5' 5.5 (1.664 m)   Wt 216 lb 4 oz (98.1 kg)   SpO2 96%   BMI 35.44 kg/m   Wt Readings from Last 3 Encounters:  12/16/23 216 lb 4 oz (98.1 kg)  11/10/23 211 lb 8 oz (95.9 kg)  10/27/23 209 lb (94.8 kg)      Physical Exam Vitals and nursing note reviewed.  Constitutional:      General: He is not in acute distress.    Appearance: Normal appearance. He is well-developed. He is not ill-appearing.  HENT:     Head: Normocephalic and atraumatic.     Right Ear: Hearing, tympanic membrane, ear canal and  external ear normal.     Left Ear: Hearing, tympanic membrane, ear canal and external ear normal.     Mouth/Throat:     Mouth: Mucous membranes are moist.     Pharynx: Oropharynx is clear. No oropharyngeal exudate or posterior oropharyngeal erythema.   Eyes:     General: No scleral icterus.    Extraocular Movements: Extraocular movements intact.     Conjunctiva/sclera: Conjunctivae normal.     Pupils: Pupils are equal, round, and reactive to light.   Neck:     Thyroid : No thyroid  mass or thyromegaly.     Vascular: No carotid bruit.   Cardiovascular:     Rate and Rhythm: Normal rate and regular rhythm.     Pulses: Normal pulses.          Radial pulses are 2+ on  the right side and 2+ on the left side.     Heart sounds: Normal heart sounds. No murmur heard. Pulmonary:     Effort: Pulmonary effort is normal. No respiratory distress.     Breath sounds: Normal breath sounds. No wheezing, rhonchi or rales.  Abdominal:     General: Bowel sounds are normal. There is no distension.     Palpations: Abdomen is soft. There is no mass.     Tenderness: There is no abdominal tenderness. There is no guarding or rebound.     Hernia: No hernia is present.   Musculoskeletal:        General: Normal range of motion.     Cervical back: Normal range of motion and neck supple.     Right lower leg: No edema.     Left lower leg: No edema.  Lymphadenopathy:     Cervical: No cervical adenopathy.   Skin:    General: Skin is warm and dry.     Findings: No rash.   Neurological:     General: No focal deficit present.     Mental Status: He is alert and oriented to person, place, and time.     Comments:  Recall 2/3, 3/3 with cue Calculation 3/5 serial 3s   Psychiatric:        Mood and Affect: Mood normal.        Behavior: Behavior normal.        Thought Content: Thought content normal.        Judgment: Judgment normal.       Results for orders placed or performed in visit on 12/09/23  Vitamin  B1   Collection Time: 12/09/23  7:32 AM  Result Value Ref Range   Vitamin B1 (Thiamine) 10 8 - 30 nmol/L  CBC with Differential/Platelet   Collection Time: 12/09/23  8:21 AM  Result Value Ref Range   WBC 6.1 3.8 - 10.8 Thousand/uL   RBC 4.80 4.20 - 5.80 Million/uL   Hemoglobin 14.9 13.2 - 17.1 g/dL   HCT 86.5 78.4 - 69.6 %   MCV 95.4 80.0 - 100.0 fL   MCH 31.0 27.0 - 33.0 pg   MCHC 32.5 32.0 - 36.0 g/dL   RDW 29.5 28.4 - 13.2 %   Platelets 260 140 - 400 Thousand/uL   MPV 10.1 7.5 - 12.5 fL   Neutro Abs 3,623 1,500 - 7,800 cells/uL   Absolute Lymphocytes 1,482 850 - 3,900 cells/uL   Absolute Monocytes 781 200 - 950 cells/uL   Eosinophils Absolute 183 15 - 500 cells/uL   Basophils Absolute 31 0 - 200 cells/uL   Neutrophils Relative % 59.4 %   Total Lymphocyte 24.3 %   Monocytes Relative 12.8 %   Eosinophils Relative 3.0 %   Basophils Relative 0.5 %  Lipid panel   Collection Time: 12/09/23  8:21 AM  Result Value Ref Range   Cholesterol 127 <200 mg/dL   HDL 56 > OR = 40 mg/dL   Triglycerides 62 <440 mg/dL   LDL Cholesterol (Calc) 57 mg/dL (calc)   Total CHOL/HDL Ratio 2.3 <5.0 (calc)   Non-HDL Cholesterol (Calc) 71 <102 mg/dL (calc)  Folate   Collection Time: 12/09/23  8:21 AM  Result Value Ref Range   Folate 9.6 ng/mL  TSH   Collection Time: 12/09/23  8:21 AM  Result Value Ref Range   TSH 2.00 0.40 - 4.50 mIU/L  Vitamin B12   Collection Time: 12/09/23  8:21 AM  Result Value  Ref Range   Vitamin B-12 671 200 - 1,100 pg/mL  Hemoglobin A1c   Collection Time: 12/09/23  8:21 AM  Result Value Ref Range   Hgb A1c MFr Bld 6.1 (H) <5.7 %   Mean Plasma Glucose 128 mg/dL   eAG (mmol/L) 7.1 mmol/L  Comprehensive metabolic panel with GFR   Collection Time: 12/09/23  8:21 AM  Result Value Ref Range   Glucose, Bld 94 65 - 99 mg/dL   BUN 16 7 - 25 mg/dL   Creat 5.36 6.44 - 0.34 mg/dL   eGFR 70 > OR = 60 VQ/QVZ/5.63O7   BUN/Creatinine Ratio SEE NOTE: 6 - 22 (calc)   Sodium  140 135 - 146 mmol/L   Potassium 4.9 3.5 - 5.3 mmol/L   Chloride 106 98 - 110 mmol/L   CO2 25 20 - 32 mmol/L   Calcium  9.4 8.6 - 10.3 mg/dL   Total Protein 6.7 6.1 - 8.1 g/dL   Albumin 4.3 3.6 - 5.1 g/dL   Globulin 2.4 1.9 - 3.7 g/dL (calc)   AG Ratio 1.8 1.0 - 2.5 (calc)   Total Bilirubin 0.4 0.2 - 1.2 mg/dL   Alkaline phosphatase (APISO) 66 35 - 144 U/L   AST 18 10 - 35 U/L   ALT 27 9 - 46 U/L    Assessment & Plan:   Problem List Items Addressed This Visit     Health maintenance examination (Chronic)   Preventative protocols reviewed and updated unless pt declined. Discussed healthy diet and lifestyle.  PSA was not checked with recent labs. Will order PSA today - no charge for for blood draw.       Advanced care planning/counseling discussion (Chronic)   Encouraged he complete and bring us  copy. Planning to see lawyer.       Medicare annual wellness visit, initial - Primary (Chronic)   I have personally reviewed the Medicare Annual Wellness questionnaire and have noted 1. The patient's medical and social history 2. Their use of alcohol, tobacco or illicit drugs 3. Their current medications and supplements 4. The patient's functional ability including ADL's, fall risks, home safety risks and hearing or visual impairment. Cognitive function has been assessed and addressed as indicated.  5. Diet and physical activity 6. Evidence for depression or mood disorders The patients weight, height, BMI have been recorded in the chart. I have made referrals, counseling and provided education to the patient based on review of the above and I have provided the pt with a written personalized care plan for preventive services. Provider list updated.. See scanned questionairre as needed for further documentation. Reviewed preventative protocols and updated unless pt declined.       Hypothyroidism   Chronic ,stable. Continue current regimen.       Relevant Medications   SYNTHROID  100  MCG tablet   HLD (hyperlipidemia)   Chronic, stable period on atorvastatin , zetia , fish oil. The ASCVD Risk score (Arnett DK, et al., 2019) failed to calculate for the following reasons:   The valid total cholesterol range is 130 to 320 mg/dL       GERD (gastroesophageal reflux disease)   Chronic, stable on daily PPI. Breakthrough symptoms if dose missed.       Relevant Medications   omeprazole  (PRILOSEC) 40 MG capsule   Severe obesity (BMI 35.0-39.9) with comorbidity (HCC)   Encourage healthy diet and lifestyle choices to affect sustainable weight loss.       Habitual alcohol use   Congratulated on cutting back  on alcohol intake.       Cancer of distal transverse colon pT2, pN0 s/p lap colectomy 09/05/2020   H/o colon adenocarcinoma.  Regularly sees GI and gen surg.  Next colonoscopy due 07/2024      OSA (obstructive sleep apnea)   Chronic, on CPAP. This is managed by cardiology.       Prediabetes   Reviewed limiting added sugars and carbs.      AK (actinic keratosis)   Significant to forearms, some on face. Discussed this, suggested dermatology referral. He will consider.      Other Visit Diagnoses       Special screening for malignant neoplasm of prostate       Relevant Orders   PSA        Meds ordered this encounter  Medications   omeprazole  (PRILOSEC) 40 MG capsule    Sig: Take 1 capsule (40 mg total) by mouth daily.    Dispense:  90 capsule    Refill:  4   SYNTHROID  100 MCG tablet    Sig: Take 1 tablet (100 mcg total) by mouth daily before breakfast.    Dispense:  90 tablet    Refill:  4   DISCONTD: Cholecalciferol (VITAMIN D3) 25 MCG (1000 UT) CAPS    Sig: Take 1 capsule (1,000 Units total) by mouth daily.   Cholecalciferol (VITAMIN D3) 50 MCG (2000 UT) capsule    Sig: Take 1 capsule (2,000 Units total) by mouth daily.   Omega-3 Fatty Acids (FISH OIL) 1200 MG CAPS    Sig: Take 1 capsule (1,200 mg total) by mouth daily.    Orders Placed This  Encounter  Procedures   PSA    Patient Instructions  You are doing well today.  Consider dermatology evaluation for rash on arms.  Work on Scientist, water quality. Bring me a copy when completed.  Good to see you today Return as needed or in 1 year for next wellness visit/physical  Follow up plan: Return in about 1 year (around 12/15/2024) for annual exam, prior fasting for blood work, medicare wellness visit.  Claire Crick, MD

## 2023-12-16 NOTE — Assessment & Plan Note (Signed)
 Chronic, stable period on atorvastatin , zetia , fish oil. The ASCVD Risk score (Arnett DK, et al., 2019) failed to calculate for the following reasons:   The valid total cholesterol range is 130 to 320 mg/dL

## 2023-12-16 NOTE — Assessment & Plan Note (Addendum)
 H/o colon adenocarcinoma.  Regularly sees GI and gen surg.  Next colonoscopy due 07/2024

## 2023-12-17 ENCOUNTER — Ambulatory Visit: Payer: Self-pay | Admitting: Family Medicine

## 2024-02-12 DIAGNOSIS — G4733 Obstructive sleep apnea (adult) (pediatric): Secondary | ICD-10-CM | POA: Diagnosis not present

## 2024-03-04 ENCOUNTER — Other Ambulatory Visit: Payer: Self-pay | Admitting: Family Medicine

## 2024-03-04 DIAGNOSIS — K219 Gastro-esophageal reflux disease without esophagitis: Secondary | ICD-10-CM

## 2024-03-04 NOTE — Telephone Encounter (Signed)
 Too soon. Rx sent 12/16/23, #90/4 refills to [facility].  Request denied.

## 2024-05-02 ENCOUNTER — Encounter: Payer: Self-pay | Admitting: Radiology

## 2024-05-18 ENCOUNTER — Ambulatory Visit: Admitting: Orthopedic Surgery

## 2024-05-18 ENCOUNTER — Other Ambulatory Visit: Payer: Self-pay

## 2024-05-18 ENCOUNTER — Encounter: Payer: Self-pay | Admitting: Orthopedic Surgery

## 2024-05-18 DIAGNOSIS — M25512 Pain in left shoulder: Secondary | ICD-10-CM

## 2024-05-18 DIAGNOSIS — M19012 Primary osteoarthritis, left shoulder: Secondary | ICD-10-CM | POA: Diagnosis not present

## 2024-05-18 NOTE — Progress Notes (Signed)
 Office Visit Note   Patient: Ephram Kornegay           Date of Birth: [birth year]           MRN: [id number] Visit Date: 05/18/2024 Requested by: Rilla Baller, MD [address] [place],  [place] [address] PCP: Rilla Baller, MD  Subjective: Chief Complaint  Patient presents with   Other    Bilateral shoulder pain L>R    HPI: Caleb Smith is a 73 y.o. male who presents to the office reporting bilateral shoulder pain left greater than right.  Denies any history of injury.  No radicular arm pain.  Does report painful range of motion and decreased range of motion.  He actually has to use the right arm to lift up the left arm.  Describes some catching and grinding as well as pain wakes him from sleep at night.  He is right-hand dominant.  No prior shoulder surgery.  Did have a prior subacromial injection in April of this last year.  Has a history of left clavicle fracture in the 70s.  Pain is a bigger problem than weakness.  Hurts him to carry things..                ROS: All systems reviewed are negative as they relate to the chief complaint within the history of present illness.  Patient denies fevers or chills.  Assessment & Plan: Visit Diagnoses:  1. Left shoulder pain, unspecified chronicity     Plan: Impression is left shoulder pain with rotator cuff arthropathy.  Glenohumeral joint injection performed today.  Not really quite bad enough yet for consideration of surgical intervention.  He wants to try this first and if it helps we could repeat the injection possibly 2 times a year.  If not he would have to live with what he has or consider reverse replacement.  Follow-up with us  as needed.  Injection performed today without difficulty.  Follow-Up Instructions: No follow-ups on file.   Orders:  Orders Placed This Encounter  Procedures   US  Guided Needle Placement - No Linked Charges   No orders of the defined types were placed in this encounter.      Procedures: Large Joint Inj: L glenohumeral on 05/18/2024 10:40 PM Indications: diagnostic evaluation and pain Details: 22 G 1.5 in needle, ultrasound-guided posterior approach  Arthrogram: No  Medications: 9 mL bupivacaine  0.5 %; 5 mL lidocaine  1 %; 40 mg triamcinolone  acetonide 40 MG/ML Outcome: tolerated well, no immediate complications Procedure, treatment alternatives, risks and benefits explained, specific risks discussed. Consent was given by the patient. Immediately prior to procedure a time out was called to verify the correct patient, procedure, equipment, support staff and site/side marked as required. Patient was prepped and draped in the usual sterile fashion.       Clinical Data: No additional findings.  Objective: Vital Signs: There were no vitals taken for this visit.  Physical Exam:  Constitutional: Patient appears well-developed HEENT:  Head: Normocephalic Eyes:EOM are normal Neck: Normal range of motion Cardiovascular: Normal rate Pulmonary/chest: Effort normal Neurologic: Patient is alert Skin: Skin is warm Psychiatric: Patient has normal mood and affect  Ortho Exam: Ortho exam demonstrates good cervical spine range of motion.  No shortening of the shoulder girdle left versus right from prior left clavicle fracture.  Patient has intact EPL FPL interosseous or/extension biceps triceps and deltoid strength.  No masses lymphadenopathy or skin changes noted in that shoulder girdle region on the  left or right-hand side.  Does have weakness to external rotation on the left compared to the right at 4- out of 5 compared to 5 out of 5.  Has some coarseness and grinding with passive range of motion at 90 degrees of abduction on the left.  He does have weakness with forward flexion and abduction on the left compared to the right.  Very little strength with anything over 90 degrees.  Passive range of motion is approximately 60/95/160.  Specialty Comments:  No specialty  comments available.  Imaging: US  Guided Needle Placement - No Linked Charges Result Date: 05/18/2024 Ultrasound imaging demonstrates needle placement into the glenohumeral joint with injection of fluid into the joint and no complicating features.     PMFS History: Patient Active Problem List   Diagnosis Date Noted   AK (actinic keratosis) 12/16/2023   Medicare annual wellness visit, initial 12/12/2022   Prediabetes 12/04/2022   OSA (obstructive sleep apnea) 08/11/2022   Sinus bradycardia by electrocardiogram 12/06/2021   S/P laparoscopic-assisted sigmoidectomy 09/05/2020   Cancer of distal transverse colon pT2, pN0 s/p lap colectomy 09/05/2020 06/21/2020   Adenomatous polyp of descending colon 05/14/2020   History of colonic polyps 05/14/2020   Habitual alcohol use 11/25/2019   Arthritis of right knee 08/09/2019   Advanced care planning/counseling discussion 11/13/2017   Coronary artery calcification seen on CAT scan 08/21/2017   Atherosclerosis of aorta 07/03/2017   Lung nodule, solitary 06/28/2017   Upper airway cough syndrome 06/26/2016   Health maintenance examination 10/27/2014   Lesion of nose 07/14/2014   Severe obesity (BMI 35.0-39.9) with comorbidity (HCC) 07/14/2014   Hypothyroidism    HLD (hyperlipidemia)    GERD (gastroesophageal reflux disease)    Past Medical History:  Diagnosis Date   Arthritis    Colon cancer (HCC)    Coronary artery calcification seen on CAT scan 08/21/2017   COVID-19 virus infection 07/2020   GERD (gastroesophageal reflux disease) 1995   s/p esoph dilation for stricture   HLD (hyperlipidemia)    Hypothyroidism    Obesity 07/14/2014   OSA on CPAP 08/11/2022    Family History  Problem Relation Age of Onset   Cancer Mother        breast   Pancreatic cancer Mother    Liver cancer Mother    CAD Maternal Uncle 49       massive MI   Suicidality Father        suicide   Stroke Neg Hx    Diabetes Neg Hx    Hypertension Neg Hx    Colon  cancer Neg Hx    Esophageal cancer Neg Hx    Inflammatory bowel disease Neg Hx    Rectal cancer Neg Hx    Stomach cancer Neg Hx     Past Surgical History:  Procedure Laterality Date   CARDIOVASCULAR STRESS TEST  07/2017   low risk study ([facility])   CATARACT EXTRACTION W/PHACO Right 10/27/2023   Procedure: PHACOEMULSIFICATION, CATARACT, WITH IOL INSERTION 7.72 00:55.4;  Surgeon: Jaye Fallow, MD;  Location: [facility];  Service: Ophthalmology;  Laterality: Right;   CATARACT EXTRACTION W/PHACO Left 11/10/2023   Procedure: PHACOEMULSIFICATION, CATARACT, WITH IOL INSERTION 10.80 01:05.4;  Surgeon: Jaye Fallow, MD;  Location[facility];  Service: Ophthalmology;  Laterality: Left;   COLON RESECTION  09/05/2020   Procedure: LAPAROSCOPIC  SEGMENTAL COLECTOMY, TAKE DOWN OF SPLEENIC FLEXURE;  Surgeon: Teresa Lonni HERO, MD;  Location: [facility];  Service: General;;   COLONOSCOPY  03/2003   small sigmoid polyp, rpt 5 yrs [facility])   COLONOSCOPY  07/2021   polyps, hemorrhoids, congested scar biopsied, single diverticula ([place])   COLONOSCOPY WITH PROPOFOL  N/A 02/14/2020   [facility], Keene B, MD)   COLONOSCOPY WITH PROPOFOL  N/A 06/07/2020   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 (Mansour[place], Aloha Raddle., MD)   ENDOSCOPIC MUCOSAL RESECTION N/A 06/07/2020   Procedure: ENDOSCOPIC MUCOSAL RESECTION;  Surgeon: Wilhelmenia Aloha Raddle., MD;  Location: [facility];  Service: Gastroenterology;  Laterality: N/A;   ESOPHAGOGASTRODUODENOSCOPY  1995   s/p esophageal dilation   FACIAL RECONSTRUCTION SURGERY  1990s   cow headbutted him   FINGER SURGERY Left teenager   reattachment of 2nd,3rd,4th   HEMOSTASIS CLIP PLACEMENT  06/07/2020   Procedure: HEMOSTASIS CLIP PLACEMENT;  Surgeon: Wilhelmenia Aloha Raddle., MD;  Location: [facility];  Service: Gastroenterology;;   POLYPECTOMY  06/07/2020   Procedure: POLYPECTOMY;  Surgeon: Wilhelmenia Aloha Raddle., MD;  Location: [facility];  Service: Gastroenterology;;   SUBMUCOSAL LIFTING INJECTION  06/07/2020   Procedure: SUBMUCOSAL LIFTING INJECTION;  Surgeon: Wilhelmenia Aloha Raddle., MD;  Location: [facility];  Service: Gastroenterology;;   TONSILLECTOMY     TOTAL KNEE ARTHROPLASTY Right 08/09/2019   Procedure: RIGHT TOTAL KNEE ARTHROPLASTY-CEMENTED;  Surgeon: Addie Cordella Hamilton, MD;  Location: [facility];  Service: Orthopedics;  Laterality: Right;   Social History   Occupational History   Not on file  Tobacco Use   Smoking status: Never   Smokeless tobacco: Former    Types: Engineer, Drilling   Vaping status: Never Used  Substance and Sexual Activity   Alcohol use: Not Currently    Comment: 2-3 beer daily  (10/21/23 - maybe 4 beers in last 2 months)   Drug use: No   Sexual activity: Not on file

## 2024-05-21 MED ORDER — LIDOCAINE HCL 1 % IJ SOLN
5.0000 mL | INTRAMUSCULAR | Status: AC | PRN
  Administered 2024-05-18: 5 mL

## 2024-05-21 MED ORDER — TRIAMCINOLONE ACETONIDE 40 MG/ML IJ SUSP
40.0000 mg | INTRAMUSCULAR | Status: AC | PRN
  Administered 2024-05-18: 40 mg via INTRA_ARTICULAR

## 2024-05-21 MED ORDER — BUPIVACAINE HCL 0.5 % IJ SOLN
9.0000 mL | INTRAMUSCULAR | Status: AC | PRN
  Administered 2024-05-18: 9 mL via INTRA_ARTICULAR

## 2024-06-01 DIAGNOSIS — H04553 Acquired stenosis of bilateral nasolacrimal duct: Secondary | ICD-10-CM | POA: Diagnosis not present

## 2024-06-01 DIAGNOSIS — M3501 Sicca syndrome with keratoconjunctivitis: Secondary | ICD-10-CM | POA: Diagnosis not present

## 2024-06-01 DIAGNOSIS — H43813 Vitreous degeneration, bilateral: Secondary | ICD-10-CM | POA: Diagnosis not present

## 2024-06-01 DIAGNOSIS — H04221 Epiphora due to insufficient drainage, right lacrimal gland: Secondary | ICD-10-CM | POA: Diagnosis not present

## 2024-06-28 ENCOUNTER — Other Ambulatory Visit: Payer: Self-pay

## 2024-06-29 MED ORDER — EZETIMIBE 10 MG PO TABS: 10.0000 mg | ORAL_TABLET | Freq: Every day | ORAL | 0 refills | Status: DC

## 2024-07-10 NOTE — Progress Notes (Unsigned)
 "  Cardiology Office Note    Date:  07/11/2024  ID:  Mckay, Tegtmeyer January 05, 1951, MRN [id number] PCP:  Rilla Baller, MD  Cardiologist:  Wilbert Bihari, MD  Electrophysiologist:  None   Chief Complaint: Follow up for coronary artery calcifications History of Present Illness: Caleb Smith   Caleb Smith is a 74 y.o. male with visit-pertinent history of hypothyroidism, hyperlipidemia, GERD, obesity, aortic atherosclerosis, CAD.  Prior CT scan with three-vessel coronary calcifications.  Stress test in 2019 was a low risk study.  Patient was lost to follow-up until he saw his primary care in 11/2021 for annual visit and was noted to be bradycardic with recommended cardiology follow-up.  At cardiology visit in 11/2021 heart rate was 67 bpm and asymptomatic in regard to previous bradycardia.  Sleep study and ZIO was ordered.  ZIO monitor worn for 1 day, fell off while at work with an average heart rate of 72 bpm with no pause and only 1 run of SVT that was 5 beats.  Sleep study did reveal OSA.  Patient had nocturnal hypoxemia with O2 sats less than 88% for 7.7 minutes.  Presented for CPAP titration but due to ongoing respiratory events could not be tolerated on CPAP.  Underwent BiPAP titration and was started on auto BiPAP ST therapy.  Patient was last in the clinic on 06/08/2023.  He had remained stable from a cardiac standpoint.  Today he presents for follow-up.  He reports that he has been doing well overall. He denies chest pain, shortness of breath, lower extremity edema, orthponea or pnd.  He denies any palpitations, presyncope or syncope.  Patient notes that he is currently recovering from an upper respiratory infection that he received from his grandchildren over the holidays and new year, notes he had some slight dizziness with significant coughing spells, is improving.  Patient notes that he typically walks regularly and is working on his farm, during the summer months cuts, mows and bales  hay.  He denies any cardiac concerns or complaints today. ROS: .   Today he denies chest pain, shortness of breath, lower extremity edema, fatigue, palpitations, melena, hematuria, hemoptysis, diaphoresis, weakness, presyncope, syncope, orthopnea, and PND.  All other systems are reviewed and otherwise negative. Studies Reviewed: Caleb Smith   EKG:  EKG is ordered today, personally reviewed, demonstrating  EKG Interpretation Date/Time:  Monday July 11 2024 10:35:20 EST Ventricular Rate:  57 PR Interval:  174 QRS Duration:  74 QT Interval:  406 QTC Calculation: 395 R Axis:   10  Text Interpretation: Sinus bradycardia Low voltage QRS Nonspecific T wave abnormality When compared with ECG of 26-Jul-2020 14:17, No significant change was found Confirmed by Donato Studley [phone]) on 07/11/2024 10:37:49 AM   CV Studies: Cardiac studies reviewed are outlined and summarized above. Otherwise please see EMR for full report. Cardiac Studies & Procedures   ______________________________________________________________________________________________   STRESS TESTS  MYOCARDIAL PERFUSION IMAGING 08/28/2017  Interpretation Summary  Nuclear stress EF: 61%.  Blood pressure demonstrated a hypertensive response to exercise.  ST segment depression was noted during stress.  The study is normal.  This is a low risk study.  The left ventricular ejection fraction is normal (55-65%).  Low risk stress nuclear study with no ischemia or infarction; note positive ST changes with exercise; EF 61 with normal wall motion.      MONITORS  LONG TERM MONITOR (3-14 DAYS) 12/20/2021  Narrative  Predominant rhythm is normal sinus rhythm with an average heart rate of 72  bpm and ranged from 48 to 119 bpm  Rare PAC and 1 run of SVT for 5 beats at 150 bpm  Rare PVC   Patch Wear Time:  1 days and 6 hours (2023-06-13T09:09:06-398 to 2023-06-14T16:04:46-0400)  Patient had a min HR of 48 bpm, max HR of 150 bpm, and avg  HR of 72 bpm. Predominant underlying rhythm was Sinus Rhythm. 1 run of Supraventricular Tachycardia occurred lasting 5 beats with a max rate of 150 bpm (avg 131 bpm). Isolated SVEs were rare (<1.0%), and no SVE Couplets or SVE Triplets were present. Isolated VEs were rare (<1.0%), and no VE Couplets or VE Triplets were present.       ______________________________________________________________________________________________       Current Reported Medications:.    Active Medications[1]  Physical Exam:    VS:  BP 134/76   Pulse 62   Ht 5' 6 (1.676 m)   Wt 223 lb 9.6 oz (101.4 kg)   SpO2 99%   BMI 36.09 kg/m    Wt Readings from Last 3 Encounters:  07/11/24 223 lb 9.6 oz (101.4 kg)  12/16/23 216 lb 4 oz (98.1 kg)  11/10/23 211 lb 8 oz (95.9 kg)    GEN: Well nourished, well developed in no acute distress NECK: No JVD; No carotid bruits CARDIAC: RRR, no murmurs, rubs, gallops RESPIRATORY:  Clear to auscultation without rales, wheezing or rhonchi  ABDOMEN: Soft, non-tender, non-distended EXTREMITIES:  No edema; No acute deformity     Asessement and Plan:.    Coronary artery calcifications: Noted on prior chest CT.  Last stress test in 08/2017 was low risk. Stable with no anginal symptoms. No indication for ischemic evaluation.  Heart healthy diet and regular cardiovascular exercise encouraged.  Reviewed ED precautions. Continue aspirin  81 mg daily, Lipitor  80 mg daily, Zetia  10 mg daily.    Hypertension: Blood pressure today 134/76.  Patient is previously noted fluctuations in blood pressure however on 2 weeks of monitoring last year Dr. Shlomo did not recommend starting medications.  Encouraged patient to continue monitoring.  Sleep apnea: Reports he uses BiPAP nightly.  Congratulated and encouraged to continue.  Past bradycardia: Noted at PCPs office in 2023 to have significant bradycardia, patient was asymptomatic.  On follow-up heart rate was normal.  EKG today indicates  sinus bradycardia at 57 bpm.  Patient denies any dizziness, lightheadedness or concerning symptoms.  Hyperlipidemia: Last lipid profile on 12/09/2023 indicates total cholesterol 127, HDL 56, triglycerides 62 and LDL 57.  Continue Lipitor  80 mg daily and Zetia  10 mg daily.   Disposition: F/u with Dr. Shlomo in one year or sooner if needed.   Signed, Portia Wisdom D Estephanie Hubbs, NP       [1]  Current Meds  Medication Sig   amoxicillin  (AMOXIL ) 500 MG capsule Take 500 mg by mouth 3 (three) times daily. Take prior to dental procedures.   Ascorbic Acid (VITAMIN C PO) Take by mouth daily.   ASPIRIN  81 PO Take 1 tablet by mouth daily.   atorvastatin  (LIPITOR ) 80 MG tablet Take 1 tablet (80 mg total) by mouth daily.   Cholecalciferol (VITAMIN D3) 50 MCG (2000 UT) capsule Take 1 capsule (2,000 Units total) by mouth daily.   ezetimibe  (ZETIA ) 10 MG tablet Take 1 tablet (10 mg total) by mouth daily.   Omega-3 Fatty Acids (FISH OIL ) 1200 MG CAPS Take 1 capsule (1,200 mg total) by mouth daily.   omeprazole  (PRILOSEC) 40 MG capsule Take 1 capsule (40 mg total) by mouth  daily.   SYNTHROID  100 MCG tablet Take 1 tablet (100 mcg total) by mouth daily before breakfast.   vitamin B-12 (CYANOCOBALAMIN ) 1000 MCG tablet Take 1,000 mcg by mouth daily.   "

## 2024-07-11 ENCOUNTER — Ambulatory Visit: Admitting: Emergency Medicine

## 2024-07-11 ENCOUNTER — Encounter: Payer: Self-pay | Admitting: Cardiology

## 2024-07-11 ENCOUNTER — Ambulatory Visit: Attending: Cardiology | Admitting: Cardiology

## 2024-07-11 VITALS — BP 134/76 | HR 62 | Ht 66.0 in | Wt 223.6 lb

## 2024-07-11 DIAGNOSIS — G4733 Obstructive sleep apnea (adult) (pediatric): Secondary | ICD-10-CM | POA: Diagnosis not present

## 2024-07-11 DIAGNOSIS — I10 Essential (primary) hypertension: Secondary | ICD-10-CM | POA: Diagnosis not present

## 2024-07-11 DIAGNOSIS — I2510 Atherosclerotic heart disease of native coronary artery without angina pectoris: Secondary | ICD-10-CM | POA: Diagnosis not present

## 2024-07-11 DIAGNOSIS — E782 Mixed hyperlipidemia: Secondary | ICD-10-CM

## 2024-07-11 MED ORDER — EZETIMIBE 10 MG PO TABS: 10.0000 mg | ORAL_TABLET | Freq: Every day | ORAL | 3 refills | Status: AC

## 2024-07-11 NOTE — Patient Instructions (Signed)
 Medication Instructions:  Your physician recommends that you continue on your current medications as directed. Please refer to the Current Medication list given to you today.  *If you need a refill on your cardiac medications before your next appointment, please call your pharmacy*  Lab Work: NONE If you have labs (blood work) drawn today and your tests are completely normal, you will receive your results only by: MyChart Message (if you have MyChart) OR A paper copy in the mail If you have any lab test that is abnormal or we need to change your treatment, we will call you to review the results.  Testing/Procedures: NONE  Follow-Up: At [facility], you and your health needs are our priority.  As part of our continuing mission to provide you with exceptional heart care, our providers are all part of one team.  This team includes your primary Cardiologist (physician) and Advanced Practice Providers or APPs (Physician Assistants and Nurse Practitioners) who all work together to provide you with the care you need, when you need it.  Your next appointment:   1 year(s)  Provider:   Wilbert Bihari, MD    We recommend signing up for the patient portal called MyChart.  Sign up information is provided on this After Visit Summary.  MyChart is used to connect with patients for Virtual Visits (Telemedicine).  Patients are able to view lab/test results, encounter notes, upcoming appointments, etc.  Non-urgent messages can be sent to your provider as well.   To learn more about what you can do with MyChart, go to [email or web address].

## 2024-12-08 ENCOUNTER — Other Ambulatory Visit

## 2024-12-16 ENCOUNTER — Other Ambulatory Visit

## 2024-12-23 ENCOUNTER — Encounter: Admitting: Family Medicine
